# Patient Record
Sex: Male | Born: 1965
Health system: Southern US, Community
[De-identification: ages and names within clinical notes are randomized; demographics above are authoritative.]

## PROBLEM LIST (undated history)

## (undated) DIAGNOSIS — E785 Hyperlipidemia, unspecified: Secondary | ICD-10-CM

## (undated) HISTORY — PX: HERNIA REPAIR: SHX51

## (undated) HISTORY — PX: CARDIAC CATHETERIZATION: SHX172

## (undated) HISTORY — DX: Hyperlipidemia, unspecified: E78.5

---
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
## 2000-11-22 ENCOUNTER — Ambulatory Visit (HOSPITAL_COMMUNITY): Admission: RE | Admit: 2000-11-22 | Discharge: 2000-11-22 | Payer: Self-pay | Admitting: General Surgery

## 2015-12-29 ENCOUNTER — Encounter: Payer: Self-pay | Admitting: Family Medicine

## 2015-12-29 ENCOUNTER — Ambulatory Visit (INDEPENDENT_AMBULATORY_CARE_PROVIDER_SITE_OTHER): Payer: 59 | Admitting: Family Medicine

## 2015-12-29 VITALS — BP 131/83 | HR 74 | Ht 71.75 in | Wt 195.6 lb

## 2015-12-29 DIAGNOSIS — E663 Overweight: Secondary | ICD-10-CM | POA: Diagnosis not present

## 2015-12-29 DIAGNOSIS — I498 Other specified cardiac arrhythmias: Secondary | ICD-10-CM

## 2015-12-29 DIAGNOSIS — E785 Hyperlipidemia, unspecified: Secondary | ICD-10-CM | POA: Insufficient documentation

## 2015-12-29 DIAGNOSIS — E782 Mixed hyperlipidemia: Secondary | ICD-10-CM | POA: Diagnosis not present

## 2015-12-29 NOTE — Progress Notes (Signed)
New patient office visit note:  Impression and Recommendations:    1. Mixed hyperlipidemia   2. Overweight (BMI 25.0-29.9)   3. Periodic heart flutter      Fasting blood work in the near future to see what cholesterol is. Education done and handouts provided Explained to patient what BMI refers to, and what it means medically.    Told patient to think about it as a "medical risk stratification measurement" and how increasing BMI is associated with increasing risk/ or worsening state of various diseases such as hypertension, hyperlipidemia, diabetes, premature OA, depression etc. American Heart Association guidelines for healthy diet, basically Mediterranean diet, and exercise guidelines of 30 minutes 5 days per week or more discussed in detail.  Health counseling performed.  All questions answered.  Patient's heart fluttering sounds benign. He feels it mostly when he is laying in bed, and he did not have them before his mother had her heart surgery and problems with her valves.  However, I told patient if he gets them during exercise or any associated symptoms, and or if he becomes more worried about the symptoms, we can send him to a cardiologist for further evaluation.   Orders Placed This Encounter  Procedures  . CBC with Differential/Platelet  . COMPLETE METABOLIC PANEL WITH GFR  . Hemoglobin A1c  . Lipid panel  . T4, free  . TSH  . VITAMIN D 25 Hydroxy (Vit-D Deficiency, Fractures)    New Prescriptions   No medications on file    Modified Medications   No medications on file    Discontinued Medications   No medications on file    Return in about 6 months (around 06/28/2016) for FBW near future and then OV with me to discuss if things are ABN.  The patient was counseled, risk factors were discussed, anticipatory guidance given.  Gross side effects, risk and benefits, and alternatives of medications discussed with patient.  Patient is aware that all  medications have potential side effects and we are unable to predict every side effect or drug-drug interaction that may occur.  Expresses verbal understanding and consents to current therapy plan and treatment regimen.  Please see AVS handed out to patient at the end of our visit for further patient instructions/ counseling done pertaining to today's office visit.    Note: This document was prepared using Dragon voice recognition software and may include unintentional dictation errors.  ----------------------------------------------------------------------------------------------------------------------    Subjective:    Chief Complaint  Patient presents with  . Establish Care    HPI: Edward Pierce is a pleasant 50 y.o. male who presents to [facility] today to review their medical history with me and establish care.   I asked the patient to review their chronic problem list with me to ensure everything was updated and accurate.    Hasn't been to doc in many yrs- 5-6 yrs.  [facility] in past.   Has h/o HLD- was told 5-6 yrs ago chol was high he has altered diet, less beef, more chicken, fish etc.  Is active but no routine exercise.     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 - married to [place], two kids- D-19 ( [place])  and s-15. Enjoys going to [employer] to their camper and bloating etc.   C/o sporadic heart flutter.  Occurs maybe once monthly--> maybe if that.  Usually at  night when he is lying in bed is when he feels it. Never has it when he is exerting himself   Feels like an "exhaleration".   Mom just had valves replaced - heart sx was Aug 23rd and has A-fib ( Dr Neita Garnet Larence Penning).  Pulse feels fine when he feels fluttering- feels regular and not fast.  No dizzines, fainting,  Cp, No Sob, No DOE.  Walks 1/4 mile to get into his work dilay--> no prob with that.  Patient Care Team    Relationship  Specialty Notifications Start End  Mellody Dance, DO PCP - General Family Medicine  12/29/15   Leandrew Koyanagi, MD Attending Physician Orthopedic Surgery  12/29/15      Wt Readings from Last 3 Encounters:  12/29/15 195 lb 9.6 oz (88.7 kg)   BP Readings from Last 3 Encounters:  12/29/15 131/83   Pulse Readings from Last 3 Encounters:  12/29/15 74   BMI Readings from Last 3 Encounters:  12/29/15 26.71 kg/m   No results found for: HGBA1C  Patient Active Problem List   Diagnosis Date Noted  . h/o Hyperlipidemia 12/29/2015  . Overweight (BMI 25.0-29.9) 12/29/2015  . Periodic heart flutter 12/29/2015     Past Medical History:  Diagnosis Date  . Hyperlipidemia      Past Surgical History:  Procedure Laterality Date  . HERNIA REPAIR       Family History  Problem Relation Age of Onset  . Cancer Mother     breast  . Diabetes Mother   . Hypertension Mother   . Heart disease Mother   . Hyperlipidemia Father   . COPD Father   . Heart attack Paternal Grandfather      History  Drug Use No    History  Alcohol Use  . 1.8 oz/week  . 1 Glasses of wine, 2 Cans of beer per week    History  Smoking Status  . Former Smoker  . Packs/day: 0.25  . Years: 13.00  . Types: Cigarettes  . Quit date: 02/12/1996  Smokeless Tobacco  . Never Used    Patient's Medications   No medications on file    Allergies: Review of patient's allergies indicates no known allergies.  Review of Systems  Constitutional: Negative.  Negative for chills, diaphoresis, fever, malaise/fatigue and weight loss.  HENT: Positive for congestion. Negative for sore throat and tinnitus.   Eyes: Negative.  Negative for blurred vision, double vision and photophobia.  Respiratory: Negative.  Negative for cough and wheezing.        Snoring  Cardiovascular: Positive for palpitations. Negative for chest pain.  Gastrointestinal: Negative.  Negative for blood in stool, diarrhea, nausea and vomiting.    Genitourinary: Negative.  Negative for dysuria, frequency and urgency.  Musculoskeletal: Negative.  Negative for joint pain and myalgias.  Skin: Negative.  Negative for itching and rash.  Neurological: Negative.  Negative for dizziness, focal weakness, weakness and headaches.  Endo/Heme/Allergies: Negative.  Negative for environmental allergies and polydipsia. Does not bruise/bleed easily.  Psychiatric/Behavioral: Negative.  Negative for depression and memory loss. The patient is not nervous/anxious and does not have insomnia.      Objective:   Blood pressure 131/83, pulse 74, height 5' 11.75" (1.822 m), weight 195 lb 9.6 oz (88.7 kg). Body mass index is 26.71 kg/m. General: Well Developed, well nourished, and in no acute distress.  Neuro: Alert and oriented x3, extra-ocular muscles intact, sensation grossly intact.  HEENT: Normocephalic, atraumatic, pupils equal round reactive to  light, neck supple, No JVD, no bruits  Skin: no gross suspicious lesions or rashes  Cardiac: Regular rate and rhythm, no murmurs rubs or gallops.  Respiratory: Essentially clear to auscultation bilaterally. Not using accessory muscles, speaking in full sentences.  Abdominal: Soft, not grossly distended Musculoskeletal: Ambulates w/o diff, FROM * 4 ext.  Vasc: less 2 sec cap RF, warm and pink  Psych:  No HI/SI, judgement and insight good, Euthymic mood. Full Affect.

## 2015-12-29 NOTE — Patient Instructions (Addendum)
Please make as many follow-up office visits as needed to discuss any other concerns you may have otherwise we will get fasting blood work in the near future and see you back as needed if there are abnormalities.   And, in 6 months ( or sooner if you so desire) we will need to do a physical examination and discuss all appropriate screening tests that will need to be done for your age/ sex.         For those diagnosed with high triglycerides, it's important to take action to lower your levels and improve your heart health.  Triglyceride is just a fancy word for fat - the fat in our bodies is stored in the form of triglycerides. Triglycerides are found in foods and manufactured in our bodies.  Normal triglyceride levels are defined as less than 150 mg/dL; 150 to 199 is considered borderline high; 200 to 499 is high; and 500 or higher is officially called very high. To me, anything over 150 is a red flag indicating my patient needs to take immediate steps to get the situation under control.   What is the significance of high triglycerides? High triglyceride levels make blood thicker and stickier, which means that it is more likely to form clots. Studies have shown that triglyceride levels are associated with increased risks of cardiovascular disease and stroke - in both men and women - alone or in combination with other risk factors (high triglycerides combined with high LDL cholesterol can be a particularly deadly combination). For example, in one ground-breaking study, high triglycerides alone increased the risk of cardiovascular disease by 14 percent in men, and by 22 percent in women. But when the test subjects also had low HDL cholesterol (that's the good cholesterol) and other risk factors, high triglycerides increased the risk of disease by 32 percent in men and 76 percent in women.   Fortunately, triglycerides can sometimes be controlled with several diet and lifestyle changes.    What Factors  Can Increase Triglycerides? As with cholesterol, eating too much of the wrong kinds of fats will raise your blood triglycerides.  Therefore, it's important to restrict the amounts of saturated fats and trans fats you allow into your diet.  Triglyceride levels can also shoot up after eating foods that are high in carbohydrates or after drinking alcohol.  That's why triglyceride blood tests require an overnight fast.  If you have elevated triglycerides, it's especially important to avoid sugary and refined carbohydrates, including sugar, honey, and other sweeteners, soda and other sugary drinks, candy, baked goods, and anything made with white (refined or enriched) flour, including white bread, rolls, cereals, buns, pastries, regular pasta, and white rice.  You'll also want to limit dried fruit and fruit juice since they're dense in simple sugar.  All of these low-quality carbs cause a sudden rise in insulin, which may lead to a spike in triglycerides.  Triglycerides can also become elevated as a reaction to having diabetes, hypothyroidism, or kidney disease. As with most other heart-related factors, being overweight and inactive also contribute to abnormal triglycerides. And unfortunately, some people have a genetic predisposition that causes them to manufacture way too much triglycerides on their own, no matter how carefully they eat.   How Can You Lower Your Triglyceride Levels? If you are diagnosed with high triglycerides, it's important to take action. There are several things you can do to help lower your triglyceride levels and improve your heart health:  Lose weight if you are overweight.  There is a clear correlation between obesity and high triglycerides - the heavier people are, the higher their triglyceride levels are likely to be. The good news is that losing weight can significantly lower triglycerides. In a large study of individuals with type 2 diabetes, those assigned to the "lifestyle  intervention group" - which involved counseling, a low-calorie meal plan, and customized exercise program - lost 8.6% of their body weight and lowered their triglyceride levels by more than 16%. If you're overweight, find a weight loss plan that works for you and commit to shedding the pounds and getting healthier.  Reduce the amount of saturated fat and trans fat in your diet.  Start by avoiding or dramatically limiting butter, cream cheese, lard, sour cream, doughnuts, cakes, cookies, candy bars, regular ice cream, fried foods, pizza, cheese sauce, cream-based sauces and salad dressings, high-fat meats (including fatty hamburgers, bologna, pepperoni, sausage, bacon, salami, pastrami, spareribs, and hot dogs), high-fat cuts of beef and pork, and whole-milk dairy products.   Other ways to cut back: Choose lean meats only (including skinless chicken and [place], lean beef, lean pork), fish, and reduced-fat or fat-free dairy products.   Experiment with adding whole soy foods to your diet. Although soy itself may not reduce risk of heart disease, it replaces hazardous animal fats with healthier proteins. Choose high-quality soy foods, such as tofu, tempeh, soy milk, and edamame (whole soybeans).  Always remove skin from poultry.  Prepare foods by baking, roasting, broiling, boiling, poaching, steaming, grilling, or stir-frying in vegetable oil.  Most stick margarines contain trans fats, and trans fats are also found in some packaged baked goods, potato chips, snack foods, fried foods, and fast food that use or create hydrogenated oils.    (All food labels must now list the amount of trans fats, right after the amount of saturated fats - good news for consumers. As a result, many food companies have now reformulated their products to be trans fat free.many, but not all! So it's still just as important to read labels and make sure the packaged foods you buy don't contain trans fats.)     If you use  margarine, purchase soft-tub margarine spreads that contain 0 grams trans fats and don't list any partially hydrogenated oils in the ingredients list. By substituting olive oil or vegetable oil for trans fats in just 2 percent of your daily calories, you can reduce your risk of heart disease by 53 percent.   There is no safe amount of trans fats, so try to keep them as far from your plate as possible.  Avoid foods that are concentrated in sugar (even dried fruit and fruit juice). Sugary foods can elevate triglyceride levels in the blood, so keep them to a bare minimum.  Swap out refined carbohydrates for whole grains.  Refined carbohydrates - like white rice, regular pasta, and anything made with white or "enriched" flour (including white bread, rolls, cereals, buns, and crackers) - raise blood sugar and insulin levels more than fiber-rich whole grains. Higher insulin levels, in turn, can lead to a higher rise in triglycerides after a meal. So, make the switch to whole wheat bread, whole grain pasta, brown or wild rice, and whole grain versions of cereals, crackers, and other bread products. However, it's important to know that individuals with high triglycerides should moderate even their intake of high-quality starches (since all starches raise blood sugar) - I recommend 1 to 2 servings per meal.  Cut way back on alcohol.  If you have high triglycerides, alcohol should be considered a rare treat - if you indulge at all, since even small amounts of alcohol can dramatically increase triglyceride levels.  Incorporate omega-3 fats.  Heart-healthy fish oils are especially rich in omega-3 fatty acids. In multiple studies over the past two decades, people who ate diets high in omega-3s had 30 to 40 percent reductions in heart disease. Although we don't yet know why fish oil works so well, there are several possibilities. Omega-3s seem to reduce inflammation, reduce high blood pressure, decrease  triglycerides, raise HDL cholesterol, and make blood thinner and less sticky so it is less likely to clot. It's as close to a food prescription for heart health as it gets. If you have high triglycerides, I recommend eating at least three servings of one of the omega-3-rich fish every week (fatty fish is the most concentrated food form of omega three fats). If you cannot manage to eat that much fish, speak with your physician about taking fish oil capsules, which offer similar benefits.The best foods for omega-3 fatty acids include wild salmon (fresh, canned), herring, mackerel (not king), sardines, anchovies, rainbow trout, and Pacific oysters. Non-fish sources of omega-3 fats include omega-3-fortified eggs, ground flaxseed, chia seeds, walnuts, butternuts (white walnuts), seaweed, walnut oil, canola oil, and soybeans.  Quit smoking.  Smoking causes inflammation, not just in your lungs, but throughout your body. Inflammation can contribute to atherosclerosis, blood clots, and risk of heart attack. Smoking makes all heart health indicators worse. If you have high cholesterol, high triglycerides, or high blood pressure, smoking magnifies the danger.  Become more physically active.  Even moderate exercise can help improve cholesterol, triglycerides, and blood pressure. Aerobic exercise seems to be able to stop the sharp rise of triglycerides after eating, perhaps because of a decrease in the amount of triglyceride released by the liver, or because active muscle clears triglycerides out of the blood stream more quickly than inactive muscle. If you haven't exercised regularly (or at all) for years, I recommend starting slowly, by walking at an easy pace for 15 minutes a day. Then, as you feel more comfortable, increase the amount. Your ultimate goal should be at least 30 minutes of moderate physical activity, at least five days a week.    Guidelines for a Low Cholesterol, Low Saturated Fat Diet   Fats -  Limit total intake of fats and oils. - Avoid butter, stick margarine, shortening, lard, palm and coconut oils. - Limit mayonnaise, salad dressings, gravies and sauces, unless they are homemade with low-fat ingredients. - Limit chocolate. - Choose low-fat and nonfat products, such as low-fat mayonnaise, low-fat or non-hydrogenated peanut butter, low-fat or fat-free salad dressings and nonfat gravy. - Use vegetable oil, such as canola or olive oil. - Look for margarine that does not contain trans fatty acids. - Use nuts in moderate amounts. - Read ingredient labels carefully to determine both amount and type of fat present in foods. Limit saturated and trans fats! - Avoid high-fat processed and convenience foods.  Meats and Meat Alternatives - Choose fish, chicken, [place] and lean meats. - Use dried beans, peas, lentils and tofu. - Limit egg yolks to three to four per week. - If you eat red meat, limit to no more than three servings per week and choose loin or round cuts. - Avoid fatty meats, such as bacon, sausage, franks, luncheon meats and ribs. - Avoid all organ meats, including liver.  Dairy - Choose nonfat or low-fat milk,  yogurt and cottage cheese. - Most cheeses are high in fat. Choose cheeses made from non-fat milk, such as mozzarella and ricotta cheese. - Choose light or fat-free cream cheese and sour cream. - Avoid cream and sauces made with cream.  Fruits and Vegetables - Eat a wide variety of fruits and vegetables. - Use lemon juice, vinegar or "mist" olive oil on vegetables. - Avoid adding sauces, fat or oil to vegetables.  Breads, Cereals and Grains - Choose whole-grain breads, cereals, pastas and rice. - Avoid high-fat snack foods, such as granola, cookies, pies, pastries, doughnuts and croissants.  Cooking Tips - Avoid deep fried foods. - Trim visible fat off meats and remove skin from poultry before cooking. - Bake, broil, boil, poach or roast poultry, fish and  lean meats. - Drain and discard fat that drains out of meat as you cook it. - Add little or no fat to foods. - Use vegetable oil sprays to grease pans for cooking or baking. - Steam vegetables. - Use herbs or no-oil marinades to flavor foods.

## 2015-12-30 ENCOUNTER — Other Ambulatory Visit (INDEPENDENT_AMBULATORY_CARE_PROVIDER_SITE_OTHER): Payer: 59

## 2015-12-30 DIAGNOSIS — E663 Overweight: Secondary | ICD-10-CM

## 2015-12-30 DIAGNOSIS — E782 Mixed hyperlipidemia: Secondary | ICD-10-CM | POA: Diagnosis not present

## 2015-12-31 LAB — COMPLETE METABOLIC PANEL WITH GFR
ALT: 17 U/L (ref 9–46)
AST: 23 U/L (ref 10–35)
Albumin: 4.4 g/dL (ref 3.6–5.1)
Alkaline Phosphatase: 75 U/L (ref 40–115)
BUN: 13 mg/dL (ref 7–25)
CHLORIDE: 105 mmol/L (ref 98–110)
CO2: 24 mmol/L (ref 20–31)
Calcium: 9.1 mg/dL (ref 8.6–10.3)
Creat: 0.89 mg/dL (ref 0.70–1.33)
Glucose, Bld: 99 mg/dL (ref 65–99)
POTASSIUM: 3.8 mmol/L (ref 3.5–5.3)
Sodium: 139 mmol/L (ref 135–146)
Total Bilirubin: 0.5 mg/dL (ref 0.2–1.2)
Total Protein: 6.9 g/dL (ref 6.1–8.1)

## 2015-12-31 LAB — CBC WITH DIFFERENTIAL/PLATELET
BASOS ABS: 0 {cells}/uL (ref 0–200)
BASOS PCT: 0 %
EOS ABS: 58 {cells}/uL (ref 15–500)
Eosinophils Relative: 1 %
HCT: 47 % (ref 38.5–50.0)
Hemoglobin: 15.6 g/dL (ref 13.2–17.1)
LYMPHS PCT: 21 %
Lymphs Abs: 1218 cells/uL (ref 850–3900)
MCH: 29.5 pg (ref 27.0–33.0)
MCHC: 33.2 g/dL (ref 32.0–36.0)
MCV: 88.8 fL (ref 80.0–100.0)
MONOS PCT: 8 %
MPV: 9.6 fL (ref 7.5–12.5)
Monocytes Absolute: 464 cells/uL (ref 200–950)
Neutro Abs: 4060 cells/uL (ref 1500–7800)
Neutrophils Relative %: 70 %
PLATELETS: 203 10*3/uL (ref 140–400)
RBC: 5.29 MIL/uL (ref 4.20–5.80)
RDW: 13.4 % (ref 11.0–15.0)
WBC: 5.8 10*3/uL (ref 3.8–10.8)

## 2015-12-31 LAB — T4, FREE: Free T4: 1.3 ng/dL (ref 0.8–1.8)

## 2015-12-31 LAB — LIPID PANEL
CHOL/HDL RATIO: 9.2 ratio — AB (ref <=5.0)
CHOLESTEROL: 293 mg/dL — AB (ref 125–200)
HDL: 32 mg/dL — AB (ref >=40)
Triglycerides: 406 mg/dL — ABNORMAL HIGH (ref <150)

## 2015-12-31 LAB — VITAMIN D 25 HYDROXY (VIT D DEFICIENCY, FRACTURES): Vit D, 25-Hydroxy: 25 ng/mL — ABNORMAL LOW (ref 30–100)

## 2015-12-31 LAB — HEMOGLOBIN A1C
Hgb A1c MFr Bld: 5.3 % (ref <5.7)
MEAN PLASMA GLUCOSE: 105 mg/dL

## 2015-12-31 LAB — TSH: TSH: 1.63 mIU/L (ref 0.40–4.50)

## 2016-01-06 ENCOUNTER — Other Ambulatory Visit: Payer: Self-pay | Admitting: Family Medicine

## 2016-01-06 DIAGNOSIS — E781 Pure hyperglyceridemia: Secondary | ICD-10-CM

## 2016-01-07 NOTE — Progress Notes (Signed)
Completed.  See note in lab results.  Charyl Bigger, CMA

## 2016-01-13 ENCOUNTER — Other Ambulatory Visit (INDEPENDENT_AMBULATORY_CARE_PROVIDER_SITE_OTHER): Payer: 59

## 2016-01-13 DIAGNOSIS — E781 Pure hyperglyceridemia: Secondary | ICD-10-CM

## 2016-01-14 LAB — LIPID PANEL W/REFLEX DIRECT LDL
Cholesterol: 290 mg/dL — ABNORMAL HIGH (ref 125–200)
HDL: 32 mg/dL — ABNORMAL LOW (ref >=40)
LDL Cholesterol: 183 mg/dL — ABNORMAL HIGH (ref <130)
TRIGLYCERIDES: 374 mg/dL — AB (ref <150)
Total CHOL/HDL Ratio: 9.1 Ratio — ABNORMAL HIGH (ref <=5.0)

## 2016-01-19 ENCOUNTER — Encounter: Payer: Self-pay | Admitting: Family Medicine

## 2016-01-19 ENCOUNTER — Ambulatory Visit (INDEPENDENT_AMBULATORY_CARE_PROVIDER_SITE_OTHER): Payer: 59 | Admitting: Family Medicine

## 2016-01-19 VITALS — BP 123/79 | HR 63 | Resp 18 | Wt 199.0 lb

## 2016-01-19 DIAGNOSIS — E786 Lipoprotein deficiency: Secondary | ICD-10-CM | POA: Diagnosis not present

## 2016-01-19 DIAGNOSIS — E559 Vitamin D deficiency, unspecified: Secondary | ICD-10-CM | POA: Diagnosis not present

## 2016-01-19 DIAGNOSIS — E781 Pure hyperglyceridemia: Secondary | ICD-10-CM | POA: Diagnosis not present

## 2016-01-19 DIAGNOSIS — E782 Mixed hyperlipidemia: Secondary | ICD-10-CM

## 2016-01-19 DIAGNOSIS — E663 Overweight: Secondary | ICD-10-CM

## 2016-01-19 DIAGNOSIS — E785 Hyperlipidemia, unspecified: Secondary | ICD-10-CM

## 2016-01-19 DIAGNOSIS — F1010 Alcohol abuse, uncomplicated: Secondary | ICD-10-CM

## 2016-01-19 HISTORY — DX: Hyperlipidemia, unspecified: E78.5

## 2016-01-19 HISTORY — DX: Pure hyperglyceridemia: E78.1

## 2016-01-19 MED ORDER — ATORVASTATIN CALCIUM 40 MG PO TABS: 40.0000 mg | ORAL_TABLET | Freq: Every day | ORAL | 3 refills | Status: DC

## 2016-01-19 MED ORDER — OMEGA-3-ACID ETHYL ESTERS 1 G PO CAPS: 2.0000 g | ORAL_CAPSULE | Freq: Two times a day (BID) | ORAL | 3 refills | Status: DC

## 2016-01-19 NOTE — Patient Instructions (Addendum)
4 your vitamin D-take 5000 IUs daily. We will recheck this in 6 months.  check liver enzymes in 6-8 wks after starting Lipitor.  Please try to really cut back on alcohol intake and no more than 1 or 2 per day. Likewise try to drink more water at least 100 ounces per day if you're not sweating or exercising or anything. If you are you'll need more.   Use lose it or my fitness pal---> track all your foods.    Fats and Cholesterol  When it comes to dietary fat, what matters most is the type of fat you eat. Contrary to past dietary advice promoting low-fat diets, newer research shows that healthy fats are necessary and beneficial for health.  When food manufacturers reduce fat, they often replace it with carbohydrates from sugar, refined grains, or other starches. Our bodies digest these refined carbohydrates and starches very quickly, affecting blood sugar and insulin levels and possibly resulting in weight gain and disease. (1-3) Findings from the Nurses' Health Study (4) and the Health Professionals Follow-up Study (5) show that no link between the overall percentage of calories from fat and any important health outcome, including cancer, heart disease, and weight gain. Rather than adopting a low-fat diet, it's more important to focus on eating beneficial "good" fats and avoiding harmful "bad" fats. Fat is an important part of a healthy diet. Choose foods with "good" unsaturated fats, limit foods high in saturated fat, and avoid "bad" trans fat.  "Good" unsaturated fats - Monounsaturated and polyunsaturated fats - lower disease risk. Foods high in good fats include vegetable oils (such as olive, canola, sunflower, soy, and corn), nuts, seeds, and fish. "Bad" fats - trans fats - increase disease risk, even when eaten in small quantities. Foods containing trans fats are primarily in processed foods made with trans fat from partially hydrogenated oil. Fortunately, trans fats have been eliminated from  many of these foods. Saturated fats, while not as harmful as trans fats, by comparison with unsaturated fats negatively impact health and are best consumed in moderation. Foods containing large amounts of saturated fat include red meat, butter, cheese, and ice cream. When you cut back on foods like red meat and butter, replace them with fish, beans, nuts, and healthy oils instead of refined carbohydrates. Read more about healthy fats in this "Ask the Expert" with [facility]'s Dr. Vinnie Langton and Amy Roney Mans, M.S., R.D., formerly of [employer]     How Much Omega-3 Should You Take Per Day?  Omega-3 fatty acids have many health benefits.  The best way to get them is by eating fatty fish at least twice a week.  If you don't eat fatty fish very often, you should consider taking a supplement.  However, it's important to make sure your supplement contains enough EPA and DHA.  EPA and DHA are the most useful types of omega-3s, and are found in fatty fish and algae.  This article reviews how much omega-3 (combined EPA and DHA) you need for optimal health.  Official Omega-3 Dosage Guidelines There is no set standard for how much omega-3 you should get each day.  Various mainstream health organizations have released their own expert opinions, but they vary considerably.  Overall, most of these organizations recommend a minimum of 250-500 mg combined EPA and DHA each day for healthy adults (1, 2, 3, 4).  However, higher amounts are often recommended for certain health conditions.  BOTTOM LINE: To date, there is no  official recommended daily allowance of omega-3s. However, most health organizations agree that 250-500 mg of combined EPA and DHA is enough for adults to maintain overall health. Omega-3 for Specific Health Conditions The following health conditions have been shown to respond to omega-3 supplements.  Here's a quick rundown of the dosages used:  Heart  Health  One trial followed 11,000 participants who took an 850-mg dose of combined EPA and DHA every day for 3.5 years. This caused a 25% reduction in heart attacks and a 45% reduction in sudden death (5).  Numerous organizations, including the AHA, recommend that patients with coronary heart disease take 1,000 mg. They recommend that patients with high triglycerides take 2,000-4,000 mg of combined EPA and DHA each day (6, 7, 8, 9).  However, several large review studies have not found any beneficial effects of omega-3 fatty acids on heart disease (10, 11).  Depression and Anxiety  Studies have shown that high doses of omega-3, from 200-2,200 mg per day, can reduce symptoms of depression and anxiety (12, 13, 14, 15).  In the case of mood and mental disorders, a supplement with higher amounts of EPA than DHA may be optimal.  Cancer  High consumption of fish and omega-3 fatty acids has been linked to a reduced risk of breast, prostate and colon cancers (16, 17, 18, 19).  However, an optimal dosage to reduce the risk of cancer has not been established.  Others  Omega-3 fatty acids can help with numerous other health problems. The effective dosages depend on numerous factors.  BOTTOM LINE: Omega-3 fatty acids can help with numerous health conditions. The dosages shown to be effective range from 200-4,000 mg. ADVERTISEMENT Omega-3 for Children and Pregnant Women Research shows that omega-3 fatty acids, especially DHA, are vital before, during and after pregnancy (20, 21, 22, 23).  Nearly all official guidelines recommend following adult guidelines and then adding an additional 200 mg of DHA during pregnancy and breastfeeding (24, 25, 26, 27).  Several global and national organizations have published guidelines for infants and children, ranging from 50-100 mg per day of combined EPA and DHA (8, 26).  BOTTOM LINE: Taking an additional 200 mg of DHA is recommended for pregnant and nursing  mothers. The recommended dose for infants and children is 50-100 mg of combined EPA and DHA per day. Omega-6 Intake May Affect Omega-3 Need The typical Western diet contains around 10 times more omega-6s than omega-3s. These omega-6 fatty acids come mainly from refined vegetable oils that are added to processed food (28, 29).  Many experts believe that this ratio should be closer to 2:1 (omega-6:omega-3) for optimal health (30).  Omega-6 and omega-3 fatty acids compete for the same enzymes. These enzymes are needed for the fatty acids to be converted into their active forms (31, 32).  Therefore, your omega-3 needs may depend on your omega-6 intake. If you consume a lot of omega-6s, you may need even higher amounts of omega-3s.  BOTTOM LINE: The human body may function best with balanced amounts of omega-6 and omega-3. The more omega-6 fatty acids you consume, the more omega-3s you may need. Too Much Omega-3 Can be Harmful The FDA has claimed that the use of omega-3s from supplements is safe if doses do not exceed 3,000 mg per day.  On the other hand, the EFSA ([place] equivalent of the FDA) has declared that up to 5,000 mg per day from supplements is safe.  These cautions are in place for several reasons. For one,  omega-3s can cause blood thinning or excessive bleeding in some people.  For this reason, many organizations encourage people who are planning surgery to stop taking omega-3 supplements a week or two before the procedure.  The second reason is due to vitamin A. This vitamin can be toxic in high amounts, and some omega-3 supplements (such as cod liver oil) are high in it.  Finally, taking more than 5,000 mg of omega-3s has never been shown to provide any added benefits. So don't take the risk.  BOTTOM LINE: Taking up to 3,000-5,000 mg of omega-3 per day appears to be safe, although such a high intake is likely not necessary for most people. ADVERTISEMENT Omega-3 Supplement  Doses It's important to read the label of your omega-3 supplement to figure out how much EPA and DHA it actually contains.  This amount varies, and the labels can be confusing. For example, a supplement may say it contains 1,000 mg of fish oil, but in reality actually contains much less than that.  Depending on the concentration of EPA and DHA that is in a dose, you may need to take as many as 8 capsules to reach the recommended amount.  I personally aim for 500 mg of combined EPA and DHA from supplements alone, in as few doses (tablespoons of fish oil or capsules) as possible.  You can find a detailed guide to omega-3 supplements, including which ones to buy, in this article: Omega-3 Supplement Guide: What to Buy and Why.  BOTTOM LINE: It's important consider how much EPA and DHA is in a supplement, not just how much fish oil it contains. This helps ensure you're getting enough EPA and DHA. Take Home Message Always follow the instructions on the omega-3 supplement label.  However, keep in mind that different people have different omega-3 needs. Some people may need to take more than others.  Aim for a minimum of 250 mg and a maximum of 3000 mg of combined EPA and DHA per day, unless instructed otherwise by a health professional.  An evidence-based article from our experts at [employer].        For those diagnosed with high triglycerides, it's important to take action to lower your levels and improve your heart health.  Triglyceride is just a fancy word for fat - the fat in our bodies is stored in the form of triglycerides. Triglycerides are found in foods and manufactured in our bodies.  Normal triglyceride levels are defined as less than 150 mg/dL; 150 to 199 is considered borderline high; 200 to 499 is high; and 500 or higher is officially called very high. To me, anything over 150 is a red flag indicating my patient needs to take immediate steps to get the situation under  control.   What is the significance of high triglycerides? High triglyceride levels make blood thicker and stickier, which means that it is more likely to form clots. Studies have shown that triglyceride levels are associated with increased risks of cardiovascular disease and stroke - in both men and women - alone or in combination with other risk factors (high triglycerides combined with high LDL cholesterol can be a particularly deadly combination). For example, in one ground-breaking study, high triglycerides alone increased the risk of cardiovascular disease by 14 percent in men, and by 74 percent in women. But when the test subjects also had low HDL cholesterol (that's the good cholesterol) and other risk factors, high triglycerides increased the risk of disease by 32 percent in  men and 30 percent in women.   Fortunately, triglycerides can sometimes be controlled with several diet and lifestyle changes.    What Factors Can Increase Triglycerides? As with cholesterol, eating too much of the wrong kinds of fats will raise your blood triglycerides.  Therefore, it's important to restrict the amounts of saturated fats and trans fats you allow into your diet.  Triglyceride levels can also shoot up after eating foods that are high in carbohydrates or after drinking alcohol.  That's why triglyceride blood tests require an overnight fast.  If you have elevated triglycerides, it's especially important to avoid sugary and refined carbohydrates, including sugar, honey, and other sweeteners, soda and other sugary drinks, candy, baked goods, and anything made with white (refined or enriched) flour, including white bread, rolls, cereals, buns, pastries, regular pasta, and white rice.  You'll also want to limit dried fruit and fruit juice since they're dense in simple sugar.  All of these low-quality carbs cause a sudden rise in insulin, which may lead to a spike in triglycerides.  Triglycerides can also become  elevated as a reaction to having diabetes, hypothyroidism, or kidney disease. As with most other heart-related factors, being overweight and inactive also contribute to abnormal triglycerides. And unfortunately, some people have a genetic predisposition that causes them to manufacture way too much triglycerides on their own, no matter how carefully they eat.   How Can You Lower Your Triglyceride Levels? If you are diagnosed with high triglycerides, it's important to take action. There are several things you can do to help lower your triglyceride levels and improve your heart health:  Lose weight if you are overweight.  There is a clear correlation between obesity and high triglycerides - the heavier people are, the higher their triglyceride levels are likely to be. The good news is that losing weight can significantly lower triglycerides. In a large study of individuals with type 2 diabetes, those assigned to the "lifestyle intervention group" - which involved counseling, a low-calorie meal plan, and customized exercise program - lost 8.6% of their body weight and lowered their triglyceride levels by more than 16%. If you're overweight, find a weight loss plan that works for you and commit to shedding the pounds and getting healthier.  Reduce the amount of saturated fat and trans fat in your diet.  Start by avoiding or dramatically limiting butter, cream cheese, lard, sour cream, doughnuts, cakes, cookies, candy bars, regular ice cream, fried foods, pizza, cheese sauce, cream-based sauces and salad dressings, high-fat meats (including fatty hamburgers, bologna, pepperoni, sausage, bacon, salami, pastrami, spareribs, and hot dogs), high-fat cuts of beef and pork, and whole-milk dairy products.   Other ways to cut back: Choose lean meats only (including skinless chicken and [place], lean beef, lean pork), fish, and reduced-fat or fat-free dairy products.   Experiment with adding whole soy foods to your  diet. Although soy itself may not reduce risk of heart disease, it replaces hazardous animal fats with healthier proteins. Choose high-quality soy foods, such as tofu, tempeh, soy milk, and edamame (whole soybeans).  Always remove skin from poultry.  Prepare foods by baking, roasting, broiling, boiling, poaching, steaming, grilling, or stir-frying in vegetable oil.  Most stick margarines contain trans fats, and trans fats are also found in some packaged baked goods, potato chips, snack foods, fried foods, and fast food that use or create hydrogenated oils.    (All food labels must now list the amount of trans fats, right after the amount of  saturated fats - good news for consumers. As a result, many food companies have now reformulated their products to be trans fat free.many, but not all! So it's still just as important to read labels and make sure the packaged foods you buy don't contain trans fats.)     If you use margarine, purchase soft-tub margarine spreads that contain 0 grams trans fats and don't list any partially hydrogenated oils in the ingredients list. By substituting olive oil or vegetable oil for trans fats in just 2 percent of your daily calories, you can reduce your risk of heart disease by 53 percent.   There is no safe amount of trans fats, so try to keep them as far from your plate as possible.  Avoid foods that are concentrated in sugar (even dried fruit and fruit juice). Sugary foods can elevate triglyceride levels in the blood, so keep them to a bare minimum.  Swap out refined carbohydrates for whole grains.  Refined carbohydrates - like white rice, regular pasta, and anything made with white or "enriched" flour (including white bread, rolls, cereals, buns, and crackers) - raise blood sugar and insulin levels more than fiber-rich whole grains. Higher insulin levels, in turn, can lead to a higher rise in triglycerides after a meal. So, make the switch to whole wheat bread, whole  grain pasta, brown or wild rice, and whole grain versions of cereals, crackers, and other bread products. However, it's important to know that individuals with high triglycerides should moderate even their intake of high-quality starches (since all starches raise blood sugar) - I recommend 1 to 2 servings per meal.  Cut way back on alcohol.  If you have high triglycerides, alcohol should be considered a rare treat - if you indulge at all, since even small amounts of alcohol can dramatically increase triglyceride levels.  Incorporate omega-3 fats.  Heart-healthy fish oils are especially rich in omega-3 fatty acids. In multiple studies over the past two decades, people who ate diets high in omega-3s had 30 to 40 percent reductions in heart disease. Although we don't yet know why fish oil works so well, there are several possibilities. Omega-3s seem to reduce inflammation, reduce high blood pressure, decrease triglycerides, raise HDL cholesterol, and make blood thinner and less sticky so it is less likely to clot. It's as close to a food prescription for heart health as it gets. If you have high triglycerides, I recommend eating at least three servings of one of the omega-3-rich fish every week (fatty fish is the most concentrated food form of omega three fats). If you cannot manage to eat that much fish, speak with your physician about taking fish oil capsules, which offer similar benefits.The best foods for omega-3 fatty acids include wild salmon (fresh, canned), herring, mackerel (not king), sardines, anchovies, rainbow trout, and Pacific oysters. Non-fish sources of omega-3 fats include omega-3-fortified eggs, ground flaxseed, chia seeds, walnuts, butternuts (white walnuts), seaweed, walnut oil, canola oil, and soybeans.  Quit smoking.  Smoking causes inflammation, not just in your lungs, but throughout your body. Inflammation can contribute to atherosclerosis, blood clots, and risk of heart attack.  Smoking makes all heart health indicators worse. If you have high cholesterol, high triglycerides, or high blood pressure, smoking magnifies the danger.  Become more physically active.  Even moderate exercise can help improve cholesterol, triglycerides, and blood pressure. Aerobic exercise seems to be able to stop the sharp rise of triglycerides after eating, perhaps because of a decrease in the amount of  triglyceride released by the liver, or because active muscle clears triglycerides out of the blood stream more quickly than inactive muscle. If you haven't exercised regularly (or at all) for years, I recommend starting slowly, by walking at an easy pace for 15 minutes a day. Then, as you feel more comfortable, increase the amount. Your ultimate goal should be at least 30 minutes of moderate physical activit5 days per week or more.

## 2016-01-19 NOTE — Progress Notes (Signed)
Assessment and plan:  1. Hyperlipemia, mixed   2. Hypertriglyceridemia, familial   3. Low HDL (under 40)   4. Vitamin D deficiency   5. Overweight (BMI 25.0-29.9)   6. Excessive drinking of alcohol at times     Vitamin D deficiency Pt's Vitamin D level is too low.   Pt needs to take an OTC daily dose of 5,000 IUs of vitamin D3.  Inform patient this will likely be a lifelong thing, and we will monitor it yearly.   Overweight (BMI 25.0-29.9) Counseling done- advised wt loss.  Use lose it or my fitness pal---> track all foods/ drinks.   Hyperlipemia, mixed  check liver enzymes in 6-8 wks after starting Lipitor.   Please try to really cut back alcohol intake and no more than 1 or 2 per day.   Likewise try to drink more water at least 100 ounces per day if you're not sweating or exercising or anything. If you are you'll need more.   Hypertriglyceridemia, familial Pt's risk is >er 7.5%.   Start meds.  Dietary changes such as low saturated & trans fat and low carb/ ketogenic diets discussed with patient.  Encouraged regular exercise and weight loss when appropriate.   Educational handouts provided at patient's desire.  Also, risks and benefits of medications discussed with patient, including alternative treatments.   Encouraged patient to read drug information handouts to further educate self about the medicine prior to starting it.   Contact us prior with any Q's/ concerns.  Low HDL (under 40) Move more  Excessive drinking of alcohol at times Connection btwn TG and ETOH d/c pt   Pt was in the office today for 40+ minutes, with over 50% time spent in face to face counseling of various medical concerns and in coordination of care.  He had many questions about what to eat and what not to eat.  Also advised tracking calories/ foods and we discussed options etc. New Prescriptions   ATORVASTATIN (LIPITOR) 40 MG  TABLET    Take 1 tablet (40 mg total) by mouth at bedtime.   OMEGA-3 ACID ETHYL ESTERS (LOVAZA) 1 G CAPSULE    Take 2 capsules (2 g total) by mouth 2 (two) times daily.    Return for 6-8 wks- reck LFT and see how tol new med.  Anticipatory guidance and routine counseling done re: condition, txmnt options and need for follow up. All questions of patient's were answered.   Gross side effects, risk and benefits, and alternatives of medications discussed with patient.  Patient is aware that all medications have potential side effects and we are unable to predict every sideeffect or drug-drug interaction that may occur.  Expresses verbal understanding and consents to current therapy plan and treatment regiment.  Please see AVS handed out to patient at the end of our visit for additional patient instructions/ counseling done pertaining to today's office visit.  Note: This document was prepared using Dragon voice recognition software and may include unintentional dictation errors.   ----------------------------------------------------------------------------------------------------------------------  Subjective:   CC:   Edward Pierce is a 50 y.o. male who presents to [place] at [facility] today for review and discussion of recent bloodwork that was done.  All recent blood work that we ordered was reviewed with patient today.  Patient was counseled on all abnormalities and we discussed dietary and lifestyle changes that could help those values (also medications when appropriate).  Extensive health counseling  performed and all patient's concerns/ questions were addressed.   From last OV:   "Hasn't been to doc in many yrs- 5-6 yrs.  [facility] in past.   Has h/o HLD- was told 5-6 yrs ago chol was high he has altered diet, less beef, more chicken, fish etc.  Is active but no routine exercise.     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 - married to [place], two kids- D-19 ( [place])  and s-15.    Enjoys going to [facility] to their camper and boating etc."    Wt Readings from Last 3 Encounters:  01/19/16 199 lb (90.3 kg)  12/29/15 195 lb 9.6 oz (88.7 kg)   BP Readings from Last 3 Encounters:  01/19/16 123/79  12/29/15 131/83   Pulse Readings from Last 3 Encounters:  01/19/16 63  12/29/15 74   BMI Readings from Last 3 Encounters:  01/19/16 27.18 kg/m  12/29/15 26.71 kg/m     Patient Care Team    Relationship Specialty Notifications Start End  Mellody Dance, DO PCP - General Family Medicine  12/29/15   Leandrew Koyanagi, MD Attending Physician Orthopedic Surgery  12/29/15     Full medical history updated and reviewed in the office today   Patient Active Problem List   Diagnosis Date Noted  . Vitamin D deficiency 01/23/2016  . Excessive drinking of alcohol at times 01/23/2016  . Hypertriglyceridemia, familial 01/19/2016  . Hyperlipemia, mixed 01/19/2016  . Low HDL (under 40) 01/19/2016  . Overweight (BMI 25.0-29.9) 12/29/2015  . Periodic heart flutter 12/29/2015    Past Medical History:  Diagnosis Date  . Hyperlipidemia     Past Surgical History:  Procedure Laterality Date  . HERNIA REPAIR      Social History  Substance Use Topics  . Smoking status: Former Smoker    Packs/day: 0.25    Years: 13.00    Types: Cigarettes    Quit date: 02/12/1996  . Smokeless tobacco: Never Used  . Alcohol use 1.8 oz/week    1 Glasses of wine, 2 Cans of beer per week    Family Hx: Family History  Problem Relation Age of Onset  . Cancer Mother     breast  . Diabetes Mother   . Hypertension Mother   . Heart disease Mother   . Hyperlipidemia Father   . COPD Father   . Heart attack Paternal Grandfather      Medications: Current Outpatient Prescriptions  Medication Sig Dispense Refill  . cholecalciferol (VITAMIN D) 1000 units tablet Take 5,000 Units by mouth daily.    Marland Kitchen  atorvastatin (LIPITOR) 40 MG tablet Take 1 tablet (40 mg total) by mouth at bedtime. 90 tablet 3  . omega-3 acid ethyl esters (LOVAZA) 1 g capsule Take 2 capsules (2 g total) by mouth 2 (two) times daily. 360 capsule 3   No current facility-administered medications for this visit.     Allergies:  No Known Allergies   ROS: Review of Systems  Constitutional: Negative.  Negative for chills, diaphoresis, fever, malaise/fatigue and weight loss.  HENT: Negative.  Negative for congestion, sore throat and tinnitus.   Eyes: Negative.  Negative for blurred vision, double vision and photophobia.  Respiratory: Negative.  Negative for cough and wheezing.   Cardiovascular: Negative.  Negative for chest pain and palpitations.       Denies cardiac sx today  Gastrointestinal: Negative.  Negative for blood in stool,  diarrhea, nausea and vomiting.  Genitourinary: Negative.  Negative for dysuria, frequency and urgency.  Musculoskeletal: Negative.  Negative for joint pain and myalgias.  Skin: Negative.  Negative for itching and rash.  Neurological: Negative.  Negative for dizziness, focal weakness, weakness and headaches.  Endo/Heme/Allergies: Negative.  Negative for environmental allergies and polydipsia. Does not bruise/bleed easily.  Psychiatric/Behavioral: Negative.  Negative for depression and memory loss. The patient is not nervous/anxious and does not have insomnia.     Objective:  Blood pressure 123/79, pulse 63, resp. rate 18, weight 199 lb (90.3 kg), SpO2 97 %. Body mass index is 27.18 kg/m. Gen:   Well NAD, A and O *3 HEENT:    [place]/AT, EOMI,  MMM, OP- clr Lungs:   Normal work of breathing. CTA B/L, no Wh, rhonchi Heart:   RRR, S1, S2 WNL's, no MRG Abd:   No gross distention Exts:    warm, pink,  Brisk capillary refill, warm and well perfused.  Psych:    No HI/SI, judgement and insight good, Euthymic mood. Full Affect.   Recent Results (from the past 2160 hour(s))  CBC with  Differential/Platelet     Status: None   Collection Time: 12/30/15  8:57 AM  Result Value Ref Range   WBC 5.8 3.8 - 10.8 K/uL   RBC 5.29 4.20 - 5.80 MIL/uL   Hemoglobin 15.6 13.2 - 17.1 g/dL   HCT 47.0 38.5 - 50.0 %   MCV 88.8 80.0 - 100.0 fL   MCH 29.5 27.0 - 33.0 pg   MCHC 33.2 32.0 - 36.0 g/dL   RDW 13.4 11.0 - 15.0 %   Platelets 203 140 - 400 K/uL   MPV 9.6 7.5 - 12.5 fL   Neutro Abs 4,060 1,500 - 7,800 cells/uL   Lymphs Abs 1,218 850 - 3,900 cells/uL   Monocytes Absolute 464 200 - 950 cells/uL   Eosinophils Absolute 58 15 - 500 cells/uL   Basophils Absolute 0 0 - 200 cells/uL   Neutrophils Relative % 70 %   Lymphocytes Relative 21 %   Monocytes Relative 8 %   Eosinophils Relative 1 %   Basophils Relative 0 %   Smear Review Criteria for review not met   COMPLETE METABOLIC PANEL WITH GFR     Status: None   Collection Time: 12/30/15  8:57 AM  Result Value Ref Range   Sodium 139 135 - 146 mmol/L   Potassium 3.8 3.5 - 5.3 mmol/L   Chloride 105 98 - 110 mmol/L   CO2 24 20 - 31 mmol/L   Glucose, Bld 99 65 - 99 mg/dL   BUN 13 7 - 25 mg/dL   Creat 0.89 0.70 - 1.33 mg/dL    Comment:   For patients > or = 50 years of age: The upper reference limit for Creatinine is approximately 13% higher for people identified as African-American.      Total Bilirubin 0.5 0.2 - 1.2 mg/dL   Alkaline Phosphatase 75 40 - 115 U/L   AST 23 10 - 35 U/L   ALT 17 9 - 46 U/L   Total Protein 6.9 6.1 - 8.1 g/dL   Albumin 4.4 3.6 - 5.1 g/dL   Calcium 9.1 8.6 - 10.3 mg/dL   GFR, Est African American >89 >=60 mL/min   GFR, Est Non African American >89 >=60 mL/min  Hemoglobin A1c     Status: None   Collection Time: 12/30/15  8:57 AM  Result Value Ref Range   Hgb A1c  MFr Bld 5.3 <5.7 %    Comment:   For the purpose of screening for the presence of diabetes:   <5.7%       Consistent with the absence of diabetes 5.7-6.4 %   Consistent with increased risk for diabetes (prediabetes) >=6.5 %      Consistent with diabetes   This assay result is consistent with a decreased risk of diabetes.   Currently, no consensus exists regarding use of hemoglobin A1c for diagnosis of diabetes in children.   According to American Diabetes Association (ADA) guidelines, hemoglobin A1c <7.0% represents optimal control in non-pregnant diabetic patients. Different metrics may apply to specific patient populations. Standards of Medical Care in Diabetes (ADA).      Mean Plasma Glucose 105 mg/dL  Lipid panel     Status: Abnormal   Collection Time: 12/30/15  8:57 AM  Result Value Ref Range   Cholesterol 293 (H) 125 - 200 mg/dL   Triglycerides 406 (H) <150 mg/dL   HDL 32 (L) >=40 mg/dL   Total CHOL/HDL Ratio 9.2 (H) <=5.0 Ratio   VLDL NOT CALC <30 mg/dL    Comment:   Not calculated due to Triglyceride >400. Suggest ordering Direct LDL (Unit Code: [phone]).    LDL Cholesterol NOT CALC <130 mg/dL    Comment:   Not calculated due to Triglyceride >400. Suggest ordering Direct LDL (Unit Code: [phone]).   Total Cholesterol/HDL Ratio:CHD Risk                        Coronary Heart Disease Risk Table                                        Men       Women          1/2 Average Risk              3.4        3.3              Average Risk              5.0        4.4           2X Average Risk              9.6        7.1           3X Average Risk             23.4       11.0 Use the calculated Patient Ratio above and the CHD Risk table  to determine the patient's CHD Risk.   T4, free     Status: None   Collection Time: 12/30/15  8:57 AM  Result Value Ref Range   Free T4 1.3 0.8 - 1.8 ng/dL  TSH     Status: None   Collection Time: 12/30/15  8:57 AM  Result Value Ref Range   TSH 1.63 0.40 - 4.50 mIU/L  VITAMIN D 25 Hydroxy (Vit-D Deficiency, Fractures)     Status: Abnormal   Collection Time: 12/30/15  8:57 AM  Result Value Ref Range   Vit D, 25-Hydroxy 25 (L) 30 - 100 ng/mL    Comment: Vitamin D Status            25-OH Vitamin D  Deficiency                <20 ng/mL        Insufficiency         20 - 29 ng/mL        Optimal             > or = 30 ng/mL   For 25-OH Vitamin D testing on patients on D2-supplementation and patients for whom quantitation of D2 and D3 fractions is required, the QuestAssureD 25-OH VIT D, (D2,D3), LC/MS/MS is recommended: order code [phone] (patients > 2 yrs).   Lipid Panel w/reflex Direct LDL     Status: Abnormal   Collection Time: 01/13/16  9:14 AM  Result Value Ref Range   Cholesterol 290 (H) 125 - 200 mg/dL   Triglycerides 374 (H) <150 mg/dL   HDL 32 (L) >=40 mg/dL   Total CHOL/HDL Ratio 9.1 (H) <=5.0 Ratio   LDL Cholesterol 183 (H) <130 mg/dL    Comment:   Total Cholesterol/HDL Ratio:CHD Risk                        Coronary Heart Disease Risk Table                                        Men       Women          1/2 Average Risk              3.4        3.3              Average Risk              5.0        4.4           2X Average Risk              9.6        7.1           3X Average Risk             23.4       11.0 Use the calculated Patient Ratio above and the CHD Risk table  to determine the patient's CHD Risk.

## 2016-01-23 DIAGNOSIS — F1010 Alcohol abuse, uncomplicated: Secondary | ICD-10-CM | POA: Insufficient documentation

## 2016-01-23 DIAGNOSIS — E559 Vitamin D deficiency, unspecified: Secondary | ICD-10-CM | POA: Insufficient documentation

## 2016-01-23 NOTE — Assessment & Plan Note (Addendum)
Pt's Vitamin D level is too low.   Pt needs to take an OTC daily dose of 5,000 IUs of vitamin D3.  Inform patient this will likely be a lifelong thing, and we will monitor it yearly.

## 2016-01-23 NOTE — Assessment & Plan Note (Signed)
Move more!

## 2016-01-23 NOTE — Assessment & Plan Note (Addendum)
>>  ASSESSMENT AND PLAN FOR HYPERTRIGLYCERIDEMIA, FAMILIAL WRITTEN ON 01/23/2016 10:22 AM BY Samreet Edenfield, DO  Pt's risk is >er 7.5%.   Start meds.  Dietary changes such as low saturated & trans fat and low carb/ ketogenic diets discussed with patient.  Encouraged regular exercise and weight loss when appropriate.   Educational handouts provided at patient's desire.  Also, risks and benefits of medications discussed with patient, including alternative treatments.   Encouraged patient to read drug information handouts to further educate self about the medicine prior to starting it.   Contact us prior with any Q's/ concerns.  >>ASSESSMENT AND PLAN FOR HYPERLIPIDEMIA WITH TARGET LDL LESS THAN 70 WRITTEN ON 01/23/2016 10:23 AM BY Sedonia Kitner, DO   check liver enzymes in 6-8 wks after starting Lipitor.   Please try to really cut back alcohol intake and no more than 1 or 2 per day.   Likewise try to drink more water at least 100 ounces per day if you're not sweating or exercising or anything. If you are you'll need more.

## 2016-01-23 NOTE — Assessment & Plan Note (Addendum)
  check liver enzymes in 6-8 wks after starting Lipitor.   Please try to really cut back alcohol intake and no more than 1 or 2 per day.   Likewise try to drink more water at least 100 ounces per day if you're not sweating or exercising or anything. If you are you'll need more.

## 2016-01-23 NOTE — Assessment & Plan Note (Addendum)
Counseling done- advised wt loss.  Use lose it or my fitness pal---> track all foods/ drinks.

## 2016-01-23 NOTE — Assessment & Plan Note (Signed)
Connection btwn TG and ETOH d/c pt

## 2016-03-02 ENCOUNTER — Ambulatory Visit (INDEPENDENT_AMBULATORY_CARE_PROVIDER_SITE_OTHER): Payer: 59 | Admitting: Family Medicine

## 2016-03-02 ENCOUNTER — Encounter: Payer: Self-pay | Admitting: Family Medicine

## 2016-03-02 VITALS — BP 128/83 | HR 69 | Ht 71.75 in | Wt 201.7 lb

## 2016-03-02 DIAGNOSIS — R030 Elevated blood-pressure reading, without diagnosis of hypertension: Secondary | ICD-10-CM | POA: Diagnosis not present

## 2016-03-02 DIAGNOSIS — E782 Mixed hyperlipidemia: Secondary | ICD-10-CM

## 2016-03-02 NOTE — Assessment & Plan Note (Signed)
Pt understands goal of 130 over 80 or less---> declines meds today.   - will monitor at home

## 2016-03-02 NOTE — Patient Instructions (Addendum)
Take at least 4 of the 1200mg  fish oils per day.   Please keep eye on BP's---> goal of 130/ 80 and less on regular basis- if remains elevated - f/up sooner than planned   Guidelines for a Low Sodium Diet   Low Sodium Diet A main source of sodium is table salt. The average American eats five or more teaspoons of salt each day. This is about 20 times as much as the body needs. In fact, your body needs only 1/4 teaspoon of salt every day. Sodium is found naturally in foods, but a lot of it is added during processing and preparation. Many foods that do not taste salty may still be high in sodium. Large amounts of sodium can be hidden in canned, processed and convenience foods. And sodium can be found in many foods that are served at [employer].  Sodium controls fluid balance in our bodies and maintains blood volume and blood pressure. Eating too much sodium may raise blood pressure and cause fluid retention, which could lead to swelling of the legs and feet or other health issues.  When limiting sodium in your diet, a common target is to eat less than 2,000 milligrams of sodium per day.   General Guidelines for Cutting Down on Salt Eliminate salty foods from your diet and reduce the amount of salt used in cooking. Sea salt is no better than regular salt.  Choose low sodium foods. Many salt-free or reduced salt products are available. When reading food labels, low sodium is defined as 140 mg of sodium per serving.  Salt substitutes are sometimes made from potassium, so read the label. If you are on a low potassium diet, then check with your doctor before using those salt substitutes.  Be creative and season your foods with spices, herbs, lemon, garlic, ginger, vinegar and pepper. Remove the salt shaker from the table.  Read ingredient labels to identify foods high in sodium. Items with 400 mg or more of sodium are high in sodium. High sodium food additives include salt, brine, or other  items that say sodium, such as monosodium glutamate.  Eat more home-cooked meals. Foods cooked from scratch are naturally lower in sodium than most instant and boxed mixes.  Don't use softened water for cooking and drinking since it contains added salt.  Avoid medications which contain sodium such as [employer].  For more information; food composition books are available which tell how much sodium is in food. Online sources such as www.calorieking.com also list amounts.     Meats, Poultry, Fish, Legumes, Eggs and Nuts  High-Sodium Foods: Smoked, cured, salted or canned meat, fish or poultry including bacon, cold cuts, ham, frankfurters, sausage, sardines, caviar and anchovies Frozen breaded meats and dinners, such as burritos and pizza Canned entrees, such as ravioli, spam and chili Salted nuts Beans canned with salt added  Low-Sodium Alternatives: Any fresh or frozen beef, lamb, pork, poultry and fish Eggs and egg substitutes Low-sodium peanut butter Dry peas and beans (not canned) Low-sodium canned fish Drained, water or oil packed canned fish or poultry   Dairy Products  High-Sodium Foods: Buttermilk Regular and processed cheese, cheese spreads and sauces Cottage cheese  Low-Sodium Alternatives: Milk, yogurt, ice cream and ice milk Low-sodium cheeses, cream cheese, ricotta cheese and mozzarella   Breads, Grains and Cereals  High-Sodium Foods: Bread and rolls with salted tops Quick breads, self-rising flour, biscuit, pancake and waffle mixes Pizza, croutons and salted crackers Prepackaged, processed  mixes for potatoes, rice, pasta and stuffing  Low-Sodium Alternatives: Breads, bagels and rolls without salted tops Muffins and most ready-to-eat cereals All rice and pasta, but do not to add salt when cooking Low-sodium corn and flour tortillas and noodles Low-sodium crackers and breadsticks Unsalted popcorn, chips and  pretzels      Vegetables and Fruits  High-Sodium Foods: Regular canned vegetables and vegetable juices Olives, pickles, sauerkraut and other pickled vegetables Vegetables made with ham, bacon or salted pork Packaged mixes, such as scalloped or au gratin potatoes, frozen hash browns and Tater Tots Commercially prepared pasta and tomato sauces and salsa  Low-Sodium Alternatives: Fresh and frozen vegetables without sauces Low-sodium canned vegetables, sauces and juices Fresh potatoes, frozen [place] fries and instant mashed potatoes Low-salt tomato or V-8 juice. Most fresh, frozen and canned fruit Dried fruits   Soups  High-Sodium Foods: Regular canned and dehydrated soup, broth and bouillon Cup of noodles and seasoned ramen mixes  Low-Sodium Alternatives: Low-sodium canned and dehydrated soups, broth and bouillon Homemade soups without added salt   Fats, Desserts and Sweets  High-Sodium Foods: Soy sauce, seasoning salt, other sauces and marinades Bottled salad dressings, regular salad dressing with bacon bits Salted butter or margarine Instant pudding and cake Large portions of ketchup, mustard  Low-Sodium Alternatives: Vinegar, unsalted butter or margarine Vegetable oils and low sodium sauces and salad dressings Mayonnaise All desserts made without salt     DASH Eating Plan DASH stands for "Dietary Approaches to Stop Hypertension." The DASH eating plan is a healthy eating plan that has been shown to reduce high blood pressure (hypertension). Additional health benefits may include reducing the risk of type 2 diabetes mellitus, heart disease, and stroke. The DASH eating plan may also help with weight loss. What do I need to know about the DASH eating plan? For the DASH eating plan, you will follow these general guidelines:  Choose foods with less than 150 milligrams of sodium per serving (as listed on the food label).  Use salt-free seasonings or herbs instead  of table salt or sea salt.  Check with your health care provider or pharmacist before using salt substitutes.  Eat lower-sodium products. These are often labeled as "low-sodium" or "no salt added."  Eat fresh foods. Avoid eating a lot of canned foods.  Eat more vegetables, fruits, and low-fat dairy products.  Choose whole grains. Look for the word "whole" as the first word in the ingredient list.  Choose fish and skinless chicken or [place] more often than red meat. Limit fish, poultry, and meat to 6 oz (170 g) each day.  Limit sweets, desserts, sugars, and sugary drinks.  Choose heart-healthy fats.  Eat more home-cooked food and less restaurant, buffet, and fast food.  Limit fried foods.  Do not fry foods. Cook foods using methods such as baking, boiling, grilling, and broiling instead.  When eating at a restaurant, ask that your food be prepared with less salt, or no salt if possible. What foods can I eat? Seek help from a dietitian for individual calorie needs. Grains  Whole grain or whole wheat bread. Brown rice. Whole grain or whole wheat pasta. Quinoa, bulgur, and whole grain cereals. Low-sodium cereals. Corn or whole wheat flour tortillas. Whole grain cornbread. Whole grain crackers. Low-sodium crackers. Vegetables  Fresh or frozen vegetables (raw, steamed, roasted, or grilled). Low-sodium or reduced-sodium tomato and vegetable juices. Low-sodium or reduced-sodium tomato sauce and paste. Low-sodium or reduced-sodium canned vegetables. Fruits  All fresh, canned (in natural  juice), or frozen fruits. Meat and Other Protein Products  Ground beef (85% or leaner), grass-fed beef, or beef trimmed of fat. Skinless chicken or [place]. Ground chicken or [place]. Pork trimmed of fat. All fish and seafood. Eggs. Dried beans, peas, or lentils. Unsalted nuts and seeds. Unsalted canned beans. Dairy  Low-fat dairy products, such as skim or 1% milk, 2% or reduced-fat cheeses, low-fat ricotta  or cottage cheese, or plain low-fat yogurt. Low-sodium or reduced-sodium cheeses. Fats and Oils  Tub margarines without trans fats. Light or reduced-fat mayonnaise and salad dressings (reduced sodium). Avocado. Safflower, olive, or canola oils. Natural peanut or almond butter. Other  Unsalted popcorn and pretzels. The items listed above may not be a complete list of recommended foods or beverages. Contact your dietitian for more options.  What foods are not recommended? Grains  White bread. White pasta. White rice. Refined cornbread. Bagels and croissants. Crackers that contain trans fat. Vegetables  Creamed or fried vegetables. Vegetables in a cheese sauce. Regular canned vegetables. Regular canned tomato sauce and paste. Regular tomato and vegetable juices. Fruits  Canned fruit in light or heavy syrup. Fruit juice. Meat and Other Protein Products  Fatty cuts of meat. Ribs, chicken wings, bacon, sausage, bologna, salami, chitterlings, fatback, hot dogs, bratwurst, and packaged luncheon meats. Salted nuts and seeds. Canned beans with salt. Dairy  Whole or 2% milk, cream, half-and-half, and cream cheese. Whole-fat or sweetened yogurt. Full-fat cheeses or blue cheese. Nondairy creamers and whipped toppings. Processed cheese, cheese spreads, or cheese curds. Condiments  Onion and garlic salt, seasoned salt, table salt, and sea salt. Canned and packaged gravies. Worcestershire sauce. Tartar sauce. Barbecue sauce. Teriyaki sauce. Soy sauce, including reduced sodium. Steak sauce. Fish sauce. Oyster sauce. Cocktail sauce. Horseradish. Ketchup and mustard. Meat flavorings and tenderizers. Bouillon cubes. Hot sauce. Tabasco sauce. Marinades. Taco seasonings. Relishes. Fats and Oils  Butter, stick margarine, lard, shortening, ghee, and bacon fat. Coconut, palm kernel, or palm oils. Regular salad dressings. Other  Pickles and olives. Salted popcorn and pretzels. The items listed above may not be a  complete list of foods and beverages to avoid. Contact your dietitian for more information.  Where can I find more information? National Heart, Lung, and Blood Institute: travelstabloid.com This information is not intended to replace advice given to you by your health care provider. Make sure you discuss any questions you have with your health care provider. Document Released: 02/17/2011 Document Revised: 08/06/2015 Document Reviewed: 01/02/2013 Elsevier Interactive Patient Education  2017 [employer].

## 2016-03-02 NOTE — Progress Notes (Signed)
Impression and Recommendations:    1. Mixed hyperlipidemia   2. Elevated blood pressure reading without diagnosis of hypertension      Tolerating medications - recheck lfts and lipid panel to see if at goal. Continue to work on diet changes  Will monitor bp; low sodium diet recommended. Aerobic activity encouraged  Elevated blood pressure reading without diagnosis of hypertension Pt understands goal of 130 over 80 or less---> declines meds today.   - will monitor at home    Orders Placed This Encounter  Procedures  . ALT     New Prescriptions   No medications on file    Modified Medications   No medications on file    Discontinued Medications   OMEGA-3 ACID ETHYL ESTERS (LOVAZA) 1 G CAPSULE    Take 2 capsules (2 g total) by mouth 2 (two) times daily.    The patient was counseled, risk factors were discussed, anticipatory guidance given.  Gross side effects, risk and benefits, and alternatives of medications and treatment plan in general discussed with patient.  Patient is aware that all medications have potential side effects and we are unable to predict every side effect or drug-drug interaction that may occur.   Patient will call with any questions prior to using medication if they have concerns.  Expresses verbal understanding and consents to current therapy and treatment regimen.  No barriers to understanding were identified.  Red flag symptoms and signs discussed in detail.  Patient expressed understanding regarding what to do in case of emergency\urgent symptoms  No Follow-up on file.  Please see AVS handed out to patient at the end of our visit for further patient instructions/ counseling done pertaining to today's office visit.    Note: This document was prepared using Dragon voice recognition software and may include unintentional dictation  errors.   --------------------------------------------------------------------------------------------------------------------------------------------------------------------------------------------------------------------------------------------    Subjective:    CC:  Chief Complaint  Patient presents with  . Hyperlipidemia    HPI: Edward Pierce is a 50 y.o. male who presents to [place] at [facility] today for issues as discussed below.   Started lipitor last OV.  tol well.    Drinking more water-- >   Fish oil-- lovaza outrageous price so pt started fish oil OTC  Taking vit D daily-- 5k IU.   Bp up - pt only sleeping on average- 6 hrs per night, but lately since busy at work  ave of 4 -5hrs.     BP at home had been running around.    Wt Readings from Last 3 Encounters:  03/02/16 201 lb 11.2 oz (91.5 kg)  01/19/16 199 lb (90.3 kg)  12/29/15 195 lb 9.6 oz (88.7 kg)   BP Readings from Last 3 Encounters:  03/02/16 128/83  01/19/16 123/79  12/29/15 131/83   Pulse Readings from Last 3 Encounters:  03/02/16 69  01/19/16 63  12/29/15 74   BMI Readings from Last 3 Encounters:  03/02/16 27.55 kg/m  01/19/16 27.18 kg/m  12/29/15 26.71 kg/m     Patient Care Team    Relationship Specialty Notifications Start End  Mellody Dance, DO PCP - General Family Medicine  12/29/15   Leandrew Koyanagi, MD Attending Physician Orthopedic Surgery  12/29/15     Patient Active Problem List   Diagnosis Date Noted  . Elevated blood pressure reading without diagnosis of hypertension 03/02/2016  . Vitamin D deficiency 01/23/2016  . Excessive drinking of alcohol at times  01/23/2016  . Hypertriglyceridemia, familial 01/19/2016  . Hyperlipemia, mixed 01/19/2016  . Low HDL (under 40) 01/19/2016  . Overweight (BMI 25.0-29.9) 12/29/2015  . Periodic heart flutter 12/29/2015    Past Medical history, Surgical history, Family history, Social history, Allergies and  Medications have been entered into the medical record, reviewed and changed as needed.   Allergies:  No Known Allergies  Review of Systems  Constitutional: Negative for chills and fever.  HENT: Negative for congestion and sinus pain.   Eyes: Negative for blurred vision and double vision.  Respiratory: Negative for shortness of breath and wheezing.   Cardiovascular: Negative for chest pain and palpitations.  Gastrointestinal: Negative for constipation, diarrhea, heartburn, nausea and vomiting.  Musculoskeletal: Negative for joint pain.       Chronic jt pains  Skin: Negative for rash.  Neurological: Negative for dizziness and focal weakness.  Endo/Heme/Allergies: Negative for polydipsia.  Psychiatric/Behavioral: Negative for depression. The patient is not nervous/anxious and does not have insomnia.      Objective:   Blood pressure 128/83, pulse 69, height 5' 11.75" (1.822 m), weight 201 lb 11.2 oz (91.5 kg). Body mass index is 27.55 kg/m. General: Well Developed, well nourished, appropriate for stated age.  Neuro: Alert and oriented x3, extra-ocular muscles intact, sensation grossly intact.  HEENT: Normocephalic, atraumatic, neck supple, no carotid bruits appreciated  Skin: no gross rash. Cardiac: RRR, S1 S2 Respiratory: ECTA B/L, Not using accessory muscles, speaking in full sentences-unlabored. Vascular:  Ext warm, dry, pink; cap RF less 2 sec. Psych: No HI/SI, judgement and insight good, Euthymic mood. Full Affect.

## 2016-03-03 LAB — ALT: ALT: 19 U/L (ref 9–46)

## 2016-06-22 ENCOUNTER — Encounter: Payer: 59 | Admitting: Family Medicine

## 2016-07-18 ENCOUNTER — Ambulatory Visit (INDEPENDENT_AMBULATORY_CARE_PROVIDER_SITE_OTHER): Payer: BLUE CROSS/BLUE SHIELD | Admitting: Family Medicine

## 2016-07-18 ENCOUNTER — Encounter: Payer: Self-pay | Admitting: Family Medicine

## 2016-07-18 VITALS — BP 129/80 | HR 70 | Ht 71.75 in | Wt 199.5 lb

## 2016-07-18 DIAGNOSIS — Z125 Encounter for screening for malignant neoplasm of prostate: Secondary | ICD-10-CM | POA: Insufficient documentation

## 2016-07-18 DIAGNOSIS — E782 Mixed hyperlipidemia: Secondary | ICD-10-CM | POA: Diagnosis not present

## 2016-07-18 DIAGNOSIS — R030 Elevated blood-pressure reading, without diagnosis of hypertension: Secondary | ICD-10-CM | POA: Diagnosis not present

## 2016-07-18 DIAGNOSIS — E559 Vitamin D deficiency, unspecified: Secondary | ICD-10-CM | POA: Diagnosis not present

## 2016-07-18 DIAGNOSIS — Z1211 Encounter for screening for malignant neoplasm of colon: Secondary | ICD-10-CM | POA: Diagnosis not present

## 2016-07-18 DIAGNOSIS — Z1389 Encounter for screening for other disorder: Secondary | ICD-10-CM | POA: Diagnosis not present

## 2016-07-18 DIAGNOSIS — E786 Lipoprotein deficiency: Secondary | ICD-10-CM | POA: Diagnosis not present

## 2016-07-18 DIAGNOSIS — Z0000 Encounter for general adult medical examination without abnormal findings: Secondary | ICD-10-CM | POA: Diagnosis not present

## 2016-07-18 DIAGNOSIS — Z1283 Encounter for screening for malignant neoplasm of skin: Secondary | ICD-10-CM

## 2016-07-18 LAB — POC HEMOCCULT BLD/STL (OFFICE/1-CARD/DIAGNOSTIC): Fecal Occult Blood, POC: NEGATIVE

## 2016-07-18 NOTE — Addendum Note (Signed)
Addended by: Fonnie Mu on: 07/18/2016 01:01 PM   Modules accepted: Orders

## 2016-07-18 NOTE — Progress Notes (Signed)
Male physical  Impression and Recommendations:    1. Encounter for wellness examination   2. Colon cancer screening   3. Screening for multiple conditions   4. Screening exam for skin cancer   5. Vitamin D deficiency   6. Hyperlipemia, mixed   7. Low HDL (under 40)   8. Elevated blood pressure reading without diagnosis of hypertension    1) labs today- see orders  1) Anticipatory Guidance: Discussed importance of wearing a seatbelt while driving, not texting while driving;   sunscreen when outside along with skin surveillance; eating a balanced and modest diet; physical activity at least 25 minutes per day or 150 min/ week moderate to intense activity.  2) Immunizations / Screenings / Labs:  All immunizations are up-to-date per recommendations or will be updated today. Patient is due for dental and vision screens which pt will schedule independently.  3) Weight:  BMI meaning discussed with patient.  Discussed goal of losing 5-10% of current body weight which would improve overall feelings of well being and improve objective health data. Improve nutrient density of diet through increasing intake of fruits and vegetables and decreasing saturated fats, white flour products and refined sugars.   Follow-up preventative CPE in 1 year. Follow-up office visit pending lab work.  F/up sooner for chronic care management and/or prn  - pt with additional concerns today which he will make f/up to discuss.  Handouts given to pt for informative purposes only  Orders Placed This Encounter  Procedures  . Lipid panel    Order Specific Question:   Has the patient fasted?    Answer:   Yes  . ALT  . AST  . Basic metabolic panel    Order Specific Question:   Has the patient fasted?    Answer:   Yes  . VITAMIN D 25 Hydroxy (Vit-D Deficiency, Fractures)     1) . Ambulatory referral to Gastroenterology-  Routine colonoscopy needed    Referral Priority:   Routine    Referral Type:    Consultation    Referral Reason:   Specialty Services Required    Number of Visits Requested:   1    .2)  Ambulatory referral to Dermatology- pt asks for referral for routine skin care as family also goes there    Referred to Provider:   [place], Amy, MD      Patient's Medications  New Prescriptions   No medications on file  Previous Medications   ATORVASTATIN (LIPITOR) 40 MG TABLET    Take 1 tablet (40 mg total) by mouth at bedtime.   CHOLECALCIFEROL (VITAMIN D) 1000 UNITS TABLET    Take 5,000 Units by mouth daily.   OMEGA-3 FATTY ACIDS (FISH OIL) 500 MG CAPS    Take 3 capsules by mouth daily.  Modified Medications   No medications on file  Discontinued Medications   No medications on file     Please see AVS handed out to patient at the end of our visit for further patient instructions/ counseling done pertaining to today's office visit.    Subjective:    CC: CPE  HPI: Edward Pierce is a 51 y.o. male who presents to [place] at [facility] today for a yearly health maintenance exam.     Health Maintenance Summary Reviewed and updated, unless pt declines services.  Colonoscopy:     Never had done; no fam hx Tdap: UTD CT scan for screening lung CA:   n/a  Abdominal Ultrasound:     Unnecessary secondary to < 65  Alcohol:    No concerns, no excessive use Exercise Habits:   None right now--- too busy at work STD concerns:   None- monogamous Drug Use:   None Birth control method:   n/a Testicular/penile concerns:     None;  no ED sx,  urinary sx etc    Wt Readings from Last 3 Encounters:  07/18/16 199 lb 8 oz (90.5 kg)  03/02/16 201 lb 11.2 oz (91.5 kg)  01/19/16 199 lb (90.3 kg)   BP Readings from Last 3 Encounters:  07/18/16 129/80  03/02/16 128/83  01/19/16 123/79   Pulse Readings from Last 3 Encounters:  07/18/16 70  03/02/16 69  01/19/16 63    Patient Active Problem List   Diagnosis Date Noted  . Colon cancer screening 07/18/2016  .  Elevated blood pressure reading without diagnosis of hypertension 03/02/2016  . Vitamin D deficiency 01/23/2016  . Excessive drinking of alcohol at times 01/23/2016  . Hypertriglyceridemia, familial 01/19/2016  . Hyperlipemia, mixed 01/19/2016  . Low HDL (under 40) 01/19/2016  . Overweight (BMI 25.0-29.9) 12/29/2015  . Periodic heart flutter 12/29/2015    Past Medical History:  Diagnosis Date  . Hyperlipidemia     Past Surgical History:  Procedure Laterality Date  . HERNIA REPAIR      Family History  Problem Relation Age of Onset  . Cancer Mother     breast  . Diabetes Mother   . Hypertension Mother   . Heart disease Mother   . Hyperlipidemia Father   . COPD Father   . Heart attack Paternal Grandfather     History  Drug Use No  ,  History  Alcohol Use  . 1.8 oz/week  . 1 Glasses of wine, 2 Cans of beer per week  ,  History  Smoking Status  . Former Smoker  . Packs/day: 0.25  . Years: 13.00  . Types: Cigarettes  . Quit date: 02/12/1996  Smokeless Tobacco  . Never Used  ,  History  Sexual Activity  . Sexual activity: Yes  . Birth control/ protection: None    Patient's Medications  New Prescriptions   No medications on file  Previous Medications   ATORVASTATIN (LIPITOR) 40 MG TABLET    Take 1 tablet (40 mg total) by mouth at bedtime.   CHOLECALCIFEROL (VITAMIN D) 1000 UNITS TABLET    Take 5,000 Units by mouth daily.   OMEGA-3 FATTY ACIDS (FISH OIL) 500 MG CAPS    Take 3 capsules by mouth daily.  Modified Medications   No medications on file  Discontinued Medications   No medications on file    Patient has no known allergies.  Review of Systems  Constitutional: Negative for chills, fever and malaise/fatigue.  HENT: Negative for congestion and tinnitus.   Eyes: Negative for blurred vision, double vision and photophobia.  Respiratory: Negative for cough and wheezing.   Cardiovascular: Negative for chest pain, palpitations and leg swelling.    Gastrointestinal: Negative for abdominal pain, blood in stool, constipation, diarrhea, nausea and vomiting.  Genitourinary: Negative for dysuria, frequency and urgency.  Musculoskeletal: Negative for joint pain and myalgias.  Skin: Negative for itching and rash.  Neurological: Negative for dizziness, focal weakness, weakness and headaches.  Endo/Heme/Allergies: Negative for environmental allergies and polydipsia. Does not bruise/bleed easily.  Psychiatric/Behavioral: Negative for depression and memory loss. The patient is not nervous/anxious and does not have insomnia.  Objective:     Blood pressure 129/80, pulse 70, height 5' 11.75" (1.822 m), weight 199 lb 8 oz (90.5 kg). Body mass index is 27.25 kg/m. General Appearance:    Alert, cooperative, no distress, appears stated age  Head:    Normocephalic, without obvious abnormality, atraumatic  Eyes:    PERRL, conjunctiva/corneas clear, EOM's intact, fundi    benign, both eyes  Ears:    Normal TM's and external ear canals, both ears  Nose:   Nares normal, septum midline, mucosa normal, no drainage    or sinus tenderness  Throat:   Lips w/o lesion, mucosa moist, and tongue normal; teeth and   gums normal  Neck:   Supple, symmetrical, trachea midline, no adenopathy;    thyroid:  no enlargement/tenderness/nodules; no carotid   bruit or JVD  Back:     Symmetric, no curvature, ROM normal, no CVA tenderness  Lungs:     Clear to auscultation bilaterally, respirations unlabored, no       Wh/ R/ R  Chest Wall:    No tenderness or gross deformity; normal excursion   Heart:    Regular rate and rhythm, S1 and S2 normal, no murmur, rub   or gallop  Abdomen:     Soft, non-tender, bowel sounds active all four quadrants, NO   G/R/R, no masses, no organomegaly  Genitalia:    Ext genitalia: without lesion, no penile rash or discharge, no hernias appreciated   Rectal:    Normal tone, prostate WNL's and equal b/l, no tenderness; guaiac negative  stool  Extremities:   Extremities normal, atraumatic, no cyanosis or gross edema  Pulses:   2+ and symmetric all extremities  Skin:   Warm, dry, Skin color, texture, turgor normal, no obvious rashes or lesions  M-Sk:   Ambulates * 4 w/o difficulty, no gross deformities, tone WNL  Neurologic:   CNII-XII intact, normal strength, sensation and reflexes    Throughout Psych:  No HI/SI, judgement and insight good, Euthymic mood. Full Affect.

## 2016-07-18 NOTE — Patient Instructions (Addendum)
- please recheck BP prior to pt leaving today.     Terbinafine tablets What is this medicine? TERBINAFINE (TER bin a feen) is an antifungal medicine. It is used to treat certain kinds of fungal or yeast infections. This medicine may be used for other purposes; ask your health care provider or pharmacist if you have questions. COMMON BRAND NAME(S): Lamisil, Terbinex What should I tell my health care provider before I take this medicine? They need to know if you have any of these conditions: -drink alcoholic beverages -kidney disease -liver disease -an unusual or allergic reaction to terbinafine, other medicines, foods, dyes, or preservatives -pregnant or trying to get pregnant -breast-feeding How should I use this medicine? Take this medicine by mouth with a full glass of water. Follow the directions on the prescription label. You can take this medicine with food or on an empty stomach. Take your medicine at regular intervals. Do not take your medicine more often than directed. Do not skip doses or stop your medicine early even if you feel better. Do not stop taking except on your doctor's advice. Talk to your pediatrician regarding the use of this medicine in children. Special care may be needed. Overdosage: If you think you have taken too much of this medicine contact a poison control center or emergency room at once. NOTE: This medicine is only for you. Do not share this medicine with others. What if I miss a dose? If you miss a dose, take it as soon as you can. If it is almost time for your next dose, take only that dose. Do not take double or extra doses. What may interact with this medicine? Do not take this medicine with any of the following medications: -thioridazine This medicine may also interact with the following medications: -beta-blockers -caffeine -cimetidine -cyclosporine -medicines for depression, anxiety, or psychotic disturbances -medicines for fungal infections  like fluconazole and ketoconazole -medicines for irregular heartbeat like amiodarone, flecainide and propafenone -rifampin -warfarin This list may not describe all possible interactions. Give your health care provider a list of all the medicines, herbs, non-prescription drugs, or dietary supplements you use. Also tell them if you smoke, drink alcohol, or use illegal drugs. Some items may interact with your medicine. What should I watch for while using this medicine? Visit your doctor or health care provider regularly. Tell your doctor right away if you have nausea or vomiting, loss of appetite, stomach pain on your right upper side, yellow skin, dark urine, light stools, or are over tired. Some fungal infections need many weeks or months of treatment to cure. If you are taking this medicine for a long time, you will need to have important blood work done. What side effects may I notice from receiving this medicine? Side effects that you should report to your doctor or health care professional as soon as possible: -allergic reactions like skin rash or hives, swelling of the face, lips, or tongue -changes in vision -dark urine -fever or infection -general ill feeling or flu-like symptoms -light-colored stools -loss of appetite, nausea -redness, blistering, peeling or loosening of the skin, including inside the mouth -right upper belly pain -unusually weak or tired -yellowing of the eyes or skin Side effects that usually do not require medical attention (report to your doctor or health care professional if they continue or are bothersome): -changes in taste -diarrhea -hair loss -muscle or joint pain -stomach gas -stomach upset This list may not describe all possible side effects. Call your doctor for  medical advice about side effects. You may report side effects to FDA at 1-800-FDA-1088. Where should I keep my medicine? Keep out of the reach of children. Store at room temperature below 25  degrees C (77 degrees F). Protect from light. Throw away any unused medicine after the expiration date. NOTE: This sheet is a summary. It may not cover all possible information. If you have questions about this medicine, talk to your doctor, pharmacist, or health care provider.  2018 Elsevier/Gold Standard (2007-05-11 16:28:07)     Onychomycosis/Fungal Toenails  WHAT IS IT? An infection that lies within the keratin of your nail plate that is caused by a fungus.  WHY ME? Fungal infections affect all ages, sexes, races, and creeds.  There may be many factors that predispose you to a fungal infection such as age, coexisting medical conditions such as diabetes, or an autoimmune disease; stress, medications, fatigue, genetics, etc.  Bottom line: fungus thrives in a warm, moist environment and your shoes offer such a location.  IS IT CONTAGIOUS? Theoretically, yes.  You do not want to share shoes, nail clippers or files with someone who has fungal toenails.  Walking around barefoot in the same room or sleeping in the same bed is unlikely to transfer the organism.  It is important to realize, however, that fungus can spread easily from one nail to the next on the same foot.  HOW DO WE TREAT THIS?  There are several ways to treat this condition.  Treatment may depend on many factors such as age, medications, pregnancy, liver and kidney conditions, etc.  It is best to ask your doctor which options are available to you.  1. No treatment.   Unlike many other medical concerns, you can live with this condition.  However for many people this can be a painful condition and may lead to ingrown toenails or a bacterial infection.  It is recommended that you keep the nails cut short to help reduce the amount of fungal nail. 2. Topical treatment.  These range from herbal remedies to prescription strength nail lacquers.  About 40-50% effective, topicals require twice daily application for approximately 9 to 12 months  or until an entirely new nail has grown out.  The most effective topicals are medical grade medications available through physicians offices. 3. Oral antifungal medications.  With an 80-90% cure rate, the most common oral medication requires 3 to 4 months of therapy and stays in your system for a year as the new nail grows out.  Oral antifungal medications do require blood work to make sure it is a safe drug for you.  A liver function panel will be performed prior to starting the medication and after the first month of treatment.  It is important to have the blood work performed to avoid any harmful side effects.  In general, this medication safe but blood work is required. 4. Laser Therapy.  This treatment is performed by applying a specialized laser to the affected nail plate.  This therapy is noninvasive, fast, and non-painful.  It is not covered by insurance and is therefore, out of pocket.  The results have been very good with a 80-95% cure rate.  [facility] is the only practice in the area to offer this therapy. Permanent Nail Avulsion.  Removing the entire nail so that a new nail will not grow back.Preventing Toenail Fungus from Recurring   Sanitize your shoes with Mycomist spray or a similar shoe sanitizer spray.  Follow the  instructions on the bottle and dry them outside in the sun or with a hairdryer.  We also recommend repeating the sanitization once weekly in shoes you wear most often.   Throw away any shoes you have worn a significant amount without socks-fungus thrives in a warm moist environment and you want to avoid re-infection after your laser procedure   Bleach your socks with regular or color safe bleach   Change your socks regularly to keep your feet clean and dry (especially if you have sweaty feet)-if sweaty feet are a problem, let your doctor know-there is a great lotion that helps with this problem.   Clean your toenail clippers with alcohol before you use them  if you do your own toenails and make sure to replace [place] boards and orange sticks regularly   If you get regular pedicures, bring your own instruments or go to a spa that sterilizes their instruments in an autoclave.  Preventive Care for Adults, Male A healthy lifestyle and preventive care can promote health and wellness. Preventive health guidelines for men include the following key practices:  A routine yearly physical is a good way to check with your health care provider about your health and preventative screening. It is a chance to share any concerns and updates on your health and to receive a thorough exam.  Visit your dentist for a routine exam and preventative care every 6 months. Brush your teeth twice a day and floss once a day. Good oral hygiene prevents tooth decay and gum disease.  The frequency of eye exams is based on your age, health, family medical history, use of contact lenses, and other factors. Follow your health care provider's recommendations for frequency of eye exams.  Eat a healthy diet. Foods such as vegetables, fruits, whole grains, low-fat dairy products, and lean protein foods contain the nutrients you need without too many calories. Decrease your intake of foods high in solid fats, added sugars, and salt. Eat the right amount of calories for you.Get information about a proper diet from your health care provider, if necessary.  Regular physical exercise is one of the most important things you can do for your health. Most adults should get at least 150 minutes of moderate-intensity exercise (any activity that increases your heart rate and causes you to sweat) each week. In addition, most adults need muscle-strengthening exercises on 2 or more days a week.  Maintain a healthy weight. The body mass index (BMI) is a screening tool to identify possible weight problems. It provides an estimate of body fat based on height and weight. Your health care provider can find your  BMI and can help you achieve or maintain a healthy weight.For adults 20 years and older:  A BMI below 18.5 is considered underweight.  A BMI of 18.5 to 24.9 is normal.  A BMI of 25 to 29.9 is considered overweight.  A BMI of 30 and above is considered obese.  Maintain normal blood lipids and cholesterol levels by exercising and minimizing your intake of saturated fat. Eat a balanced diet with plenty of fruit and vegetables. Blood tests for lipids and cholesterol should begin at age 54 and be repeated every 5 years. If your lipid or cholesterol levels are high, you are over 50, or you are at high risk for heart disease, you may need your cholesterol levels checked more frequently.Ongoing high lipid and cholesterol levels should be treated with medicines if diet and exercise are not working.  If you smoke,  find out from your health care provider how to quit. If you do not use tobacco, do not start.  Lung cancer screening is recommended for adults aged 20-80 years who are at high risk for developing lung cancer because of a history of smoking. A yearly low-dose CT scan of the lungs is recommended for people who have at least a 30-pack-year history of smoking and are a current smoker or have quit within the past 15 years. A pack year of smoking is smoking an average of 1 pack of cigarettes a day for 1 year (for example: 1 pack a day for 30 years or 2 packs a day for 15 years). Yearly screening should continue until the smoker has stopped smoking for at least 15 years. Yearly screening should be stopped for people who develop a health problem that would prevent them from having lung cancer treatment.  If you choose to drink alcohol, do not have more than 2 drinks per day. One drink is considered to be 12 ounces (355 mL) of beer, 5 ounces (148 mL) of wine, or 1.5 ounces (44 mL) of liquor.  Avoid use of street drugs. Do not share needles with anyone. Ask for help if you need support or instructions  about stopping the use of drugs.  High blood pressure causes heart disease and increases the risk of stroke. Your blood pressure should be checked at least every 1-2 years. Ongoing high blood pressure should be treated with medicines, if weight loss and exercise are not effective.  If you are 4-70 years old, ask your health care provider if you should take aspirin to prevent heart disease.  Diabetes screening is done by taking a blood sample to check your blood glucose level after you have not eaten for a certain period of time (fasting). If you are not overweight and you do not have risk factors for diabetes, you should be screened once every 3 years starting at age 62. If you are overweight or obese and you are 79-68 years of age, you should be screened for diabetes every year as part of your cardiovascular risk assessment.  Colorectal cancer can be detected and often prevented. Most routine colorectal cancer screening begins at the age of 69 and continues through age 66. However, your health care provider may recommend screening at an earlier age if you have risk factors for colon cancer. On a yearly basis, your health care provider may provide home test kits to check for hidden blood in the stool. Use of a small camera at the end of a tube to directly examine the colon (sigmoidoscopy or colonoscopy) can detect the earliest forms of colorectal cancer. Talk to your health care provider about this at age 54, when routine screening begins. Direct exam of the colon should be repeated every 5-10 years through age 49, unless early forms of precancerous polyps or small growths are found.  People who are at an increased risk for hepatitis B should be screened for this virus. You are considered at high risk for hepatitis B if:  You were born in a country where hepatitis B occurs often. Talk with your health care provider about which countries are considered high risk.  Your parents were born in a high-risk  country and you have not received a shot to protect against hepatitis B (hepatitis B vaccine).  You have HIV or AIDS.  You use needles to inject street drugs.  You live with, or have sex with, someone who has hepatitis  B.  You are a man who has sex with other men (MSM).  You get hemodialysis treatment.  You take certain medicines for conditions such as cancer, organ transplantation, and autoimmune conditions.  Hepatitis C blood testing is recommended for all people born from [birth year] through 1965 and any individual with known risks for hepatitis C.  Practice safe sex. Use condoms and avoid high-risk sexual practices to reduce the spread of sexually transmitted infections (STIs). STIs include gonorrhea, chlamydia, syphilis, trichomonas, herpes, HPV, and human immunodeficiency virus (HIV). Herpes, HIV, and HPV are viral illnesses that have no cure. They can result in disability, cancer, and death.  If you are a man who has sex with other men, you should be screened at least once per year for:  HIV.  Urethral, rectal, and pharyngeal infection of gonorrhea, chlamydia, or both.  If you are at risk of being infected with HIV, it is recommended that you take a prescription medicine daily to prevent HIV infection. This is called preexposure prophylaxis (PrEP). You are considered at risk if:  You are a man who has sex with other men (MSM) and have other risk factors.  You are a heterosexual man, are sexually active, and are at increased risk for HIV infection.  You take drugs by injection.  You are sexually active with a partner who has HIV.  Talk with your health care provider about whether you are at high risk of being infected with HIV. If you choose to begin PrEP, you should first be tested for HIV. You should then be tested every 3 months for as long as you are taking PrEP.  A one-time screening for abdominal aortic aneurysm (AAA) and surgical repair of large AAAs by ultrasound are  recommended for men ages 79 to 41 years who are current or former smokers.  Healthy men should no longer receive prostate-specific antigen (PSA) blood tests as part of routine cancer screening. Talk with your health care provider about prostate cancer screening.  Testicular cancer screening is not recommended for adult males who have no symptoms. Screening includes self-exam, a health care provider exam, and other screening tests. Consult with your health care provider about any symptoms you have or any concerns you have about testicular cancer.  Use sunscreen. Apply sunscreen liberally and repeatedly throughout the day. You should seek shade when your shadow is shorter than you. Protect yourself by wearing long sleeves, pants, a wide-brimmed hat, and sunglasses year round, whenever you are outdoors.  Once a month, do a whole-body skin exam, using a mirror to look at the skin on your back. Tell your health care provider about new moles, moles that have irregular borders, moles that are larger than a pencil eraser, or moles that have changed in shape or color.  Stay current with required vaccines (immunizations).  Influenza vaccine. All adults should be immunized every year.  Tetanus, diphtheria, and acellular pertussis (Td, Tdap) vaccine. An adult who has not previously received Tdap or who does not know his vaccine status should receive 1 dose of Tdap. This initial dose should be followed by tetanus and diphtheria toxoids (Td) booster doses every 10 years. Adults with an unknown or incomplete history of completing a 3-dose immunization series with Td-containing vaccines should begin or complete a primary immunization series including a Tdap dose. Adults should receive a Td booster every 10 years.  Varicella vaccine. An adult without evidence of immunity to varicella should receive 2 doses or a second dose if he has  previously received 1 dose.  Human papillomavirus (HPV) vaccine. Males aged 11-21  years who have not received the vaccine previously should receive the 3-dose series. Males aged 22-26 years may be immunized. Immunization is recommended through the age of 29 years for any male who has sex with males and did not get any or all doses earlier. Immunization is recommended for any person with an immunocompromised condition through the age of 74 years if he did not get any or all doses earlier. During the 3-dose series, the second dose should be obtained 4-8 weeks after the first dose. The third dose should be obtained 24 weeks after the first dose and 16 weeks after the second dose.  Zoster vaccine. One dose is recommended for adults aged 62 years or older unless certain conditions are present.  Measles, mumps, and rubella (MMR) vaccine. Adults born before [birth year] generally are considered immune to measles and mumps. Adults born in [birth year] or later should have 1 or more doses of MMR vaccine unless there is a contraindication to the vaccine or there is laboratory evidence of immunity to each of the three diseases. A routine second dose of MMR vaccine should be obtained at least 28 days after the first dose for students attending postsecondary schools, health care workers, or international travelers. People who received inactivated measles vaccine or an unknown type of measles vaccine during 1963-1967 should receive 2 doses of MMR vaccine. People who received inactivated mumps vaccine or an unknown type of mumps vaccine before 1979 and are at high risk for mumps infection should consider immunization with 2 doses of MMR vaccine. Unvaccinated health care workers born before [birth year] who lack laboratory evidence of measles, mumps, or rubella immunity or laboratory confirmation of disease should consider measles and mumps immunization with 2 doses of MMR vaccine or rubella immunization with 1 dose of MMR vaccine.  Pneumococcal 13-valent conjugate (PCV13) vaccine. When indicated, a person who is uncertain of his  immunization history and has no record of immunization should receive the PCV13 vaccine. All adults 92 years of age and older should receive this vaccine. An adult aged 54 years or older who has certain medical conditions and has not been previously immunized should receive 1 dose of PCV13 vaccine. This PCV13 should be followed with a dose of pneumococcal polysaccharide (PPSV23) vaccine. Adults who are at high risk for pneumococcal disease should obtain the PPSV23 vaccine at least 8 weeks after the dose of PCV13 vaccine. Adults older than 51 years of age who have normal immune system function should obtain the PPSV23 vaccine dose at least 1 year after the dose of PCV13 vaccine.  Pneumococcal polysaccharide (PPSV23) vaccine. When PCV13 is also indicated, PCV13 should be obtained first. All adults aged 91 years and older should be immunized. An adult younger than age 23 years who has certain medical conditions should be immunized. Any person who resides in a nursing home or long-term care facility should be immunized. An adult smoker should be immunized. People with an immunocompromised condition and certain other conditions should receive both PCV13 and PPSV23 vaccines. People with human immunodeficiency virus (HIV) infection should be immunized as soon as possible after diagnosis. Immunization during chemotherapy or radiation therapy should be avoided. Routine use of PPSV23 vaccine is not recommended for American Indians, [place] Natives, or people younger than 65 years unless there are medical conditions that require PPSV23 vaccine. When indicated, people who have unknown immunization and have no record of immunization should receive PPSV23 vaccine. One-time  revaccination 5 years after the first dose of PPSV23 is recommended for people aged 19-64 years who have chronic kidney failure, nephrotic syndrome, asplenia, or immunocompromised conditions. People who received 1-2 doses of PPSV23 before age 23 years should  receive another dose of PPSV23 vaccine at age 18 years or later if at least 5 years have passed since the previous dose. Doses of PPSV23 are not needed for people immunized with PPSV23 at or after age 7 years.  Meningococcal vaccine. Adults with asplenia or persistent complement component deficiencies should receive 2 doses of quadrivalent meningococcal conjugate (MenACWY-D) vaccine. The doses should be obtained at least 2 months apart. Microbiologists working with certain meningococcal bacteria, [employer] recruits, people at risk during an outbreak, and people who travel to or live in countries with a high rate of meningitis should be immunized. A first-year college student up through age 57 years who is living in a residence hall should receive a dose if he did not receive a dose on or after his 16th birthday. Adults who have certain high-risk conditions should receive one or more doses of vaccine.  Hepatitis A vaccine. Adults who wish to be protected from this disease, have chronic liver disease, work with hepatitis A-infected animals, work in hepatitis A research labs, or travel to or work in countries with a high rate of hepatitis A should be immunized. Adults who were previously unvaccinated and who anticipate close contact with an international adoptee during the first 60 days after arrival in the [place] from a country with a high rate of hepatitis A should be immunized.  Hepatitis B vaccine. Adults should be immunized if they wish to be protected from this disease, are under age 28 years and have diabetes, have chronic liver disease, have had more than one sex partner in the past 6 months, may be exposed to blood or other infectious body fluids, are household contacts or sex partners of hepatitis B positive people, are clients or workers in certain care facilities, or travel to or work in countries with a high rate of hepatitis B.  Haemophilus influenzae type b (Hib) vaccine. A previously  unvaccinated person with asplenia or sickle cell disease or having a scheduled splenectomy should receive 1 dose of Hib vaccine. Regardless of previous immunization, a recipient of a hematopoietic stem cell transplant should receive a 3-dose series 6-12 months after his successful transplant. Hib vaccine is not recommended for adults with HIV infection. Preventive Service / Frequency Ages 60 to 17  Blood pressure check.** / Every 3-5 years.  Lipid and cholesterol check.** / Every 5 years beginning at age 4.  Hepatitis C blood test.** / For any individual with known risks for hepatitis C.  Skin self-exam. / Monthly.  Influenza vaccine. / Every year.  Tetanus, diphtheria, and acellular pertussis (Tdap, Td) vaccine.** / Consult your health care provider. 1 dose of Td every 10 years.  Varicella vaccine.** / Consult your health care provider.  HPV vaccine. / 3 doses over 6 months, if 71 or younger.  Measles, mumps, rubella (MMR) vaccine.** / You need at least 1 dose of MMR if you were born in 1957 or later. You may also need a second dose.  Pneumococcal 13-valent conjugate (PCV13) vaccine.** / Consult your health care provider.  Pneumococcal polysaccharide (PPSV23) vaccine.** / 1 to 2 doses if you smoke cigarettes or if you have certain conditions.  Meningococcal vaccine.** / 1 dose if you are age 90 to 88 years and a Market researcher  living in a residence hall, or have one of several medical conditions. You may also need additional booster doses.  Hepatitis A vaccine.** / Consult your health care provider.  Hepatitis B vaccine.** / Consult your health care provider.  Haemophilus influenzae type b (Hib) vaccine.** / Consult your health care provider. Ages 6 to 53  Blood pressure check.** / Every year.  Lipid and cholesterol check.** / Every 5 years beginning at age 37.  Lung cancer screening. / Every year if you are aged 46-80 years and have a 30-pack-year history of  smoking and currently smoke or have quit within the past 15 years. Yearly screening is stopped once you have quit smoking for at least 15 years or develop a health problem that would prevent you from having lung cancer treatment.  Fecal occult blood test (FOBT) of stool. / Every year beginning at age 58 and continuing until age 35. You may not have to do this test if you get a colonoscopy every 10 years.  Flexible sigmoidoscopy** or colonoscopy.** / Every 5 years for a flexible sigmoidoscopy or every 10 years for a colonoscopy beginning at age 90 and continuing until age 86.  Hepatitis C blood test.** / For all people born from [birth year] through 1965 and any individual with known risks for hepatitis C.  Skin self-exam. / Monthly.  Influenza vaccine. / Every year.  Tetanus, diphtheria, and acellular pertussis (Tdap/Td) vaccine.** / Consult your health care provider. 1 dose of Td every 10 years.  Varicella vaccine.** / Consult your health care provider.  Zoster vaccine.** / 1 dose for adults aged 38 years or older.  Measles, mumps, rubella (MMR) vaccine.** / You need at least 1 dose of MMR if you were born in 1957 or later. You may also need a second dose.  Pneumococcal 13-valent conjugate (PCV13) vaccine.** / Consult your health care provider.  Pneumococcal polysaccharide (PPSV23) vaccine.** / 1 to 2 doses if you smoke cigarettes or if you have certain conditions.  Meningococcal vaccine.** / Consult your health care provider.  Hepatitis A vaccine.** / Consult your health care provider.  Hepatitis B vaccine.** / Consult your health care provider.  Haemophilus influenzae type b (Hib) vaccine.** / Consult your health care provider. Ages 75 and over  Blood pressure check.** / Every year.  Lipid and cholesterol check.**/ Every 5 years beginning at age 1.  Lung cancer screening. / Every year if you are aged 46-80 years and have a 30-pack-year history of smoking and currently smoke or have  quit within the past 15 years. Yearly screening is stopped once you have quit smoking for at least 15 years or develop a health problem that would prevent you from having lung cancer treatment.  Fecal occult blood test (FOBT) of stool. / Every year beginning at age 62 and continuing until age 75. You may not have to do this test if you get a colonoscopy every 10 years.  Flexible sigmoidoscopy** or colonoscopy.** / Every 5 years for a flexible sigmoidoscopy or every 10 years for a colonoscopy beginning at age 54 and continuing until age 26.  Hepatitis C blood test.** / For all people born from [birth year] through 1965 and any individual with known risks for hepatitis C.  Abdominal aortic aneurysm (AAA) screening.** / A one-time screening for ages 59 to 16 years who are current or former smokers.  Skin self-exam. / Monthly.  Influenza vaccine. / Every year.  Tetanus, diphtheria, and acellular pertussis (Tdap/Td) vaccine.** / 1 dose of Td every  10 years.  Varicella vaccine.** / Consult your health care provider.  Zoster vaccine.** / 1 dose for adults aged 43 years or older.  Pneumococcal 13-valent conjugate (PCV13) vaccine.** / 1 dose for all adults aged 73 years and older.  Pneumococcal polysaccharide (PPSV23) vaccine.** / 1 dose for all adults aged 9 years and older.  Meningococcal vaccine.** / Consult your health care provider.  Hepatitis A vaccine.** / Consult your health care provider.  Hepatitis B vaccine.** / Consult your health care provider.  Haemophilus influenzae type b (Hib) vaccine.** / Consult your health care provider. **Family history and personal history of risk and conditions may change your health care provider's recommendations.   This information is not intended to replace advice given to you by your health care provider. Make sure you discuss any questions you have with your health care provider.   Document Released: 04/26/2001 Document Revised: 03/21/2014 Document  Reviewed: 07/26/2010 Elsevier Interactive Patient Education [employer].

## 2016-07-19 LAB — BASIC METABOLIC PANEL
BUN/Creatinine Ratio: 12 (ref 9–20)
BUN: 12 mg/dL (ref 6–24)
CALCIUM: 9.6 mg/dL (ref 8.7–10.2)
CHLORIDE: 102 mmol/L (ref 96–106)
CO2: 24 mmol/L (ref 18–29)
Creatinine, Ser: 1.04 mg/dL (ref 0.76–1.27)
GFR calc non Af Amer: 83 mL/min/{1.73_m2} (ref >59)
GFR, EST AFRICAN AMERICAN: 96 mL/min/{1.73_m2} (ref >59)
Glucose: 108 mg/dL — ABNORMAL HIGH (ref 65–99)
POTASSIUM: 4.7 mmol/L (ref 3.5–5.2)
Sodium: 142 mmol/L (ref 134–144)

## 2016-07-19 LAB — LIPID PANEL
Chol/HDL Ratio: 4.5 ratio (ref 0.0–5.0)
Cholesterol, Total: 177 mg/dL (ref 100–199)
HDL: 39 mg/dL — ABNORMAL LOW (ref >39)
LDL Calculated: 106 mg/dL — ABNORMAL HIGH (ref 0–99)
Triglycerides: 160 mg/dL — ABNORMAL HIGH (ref 0–149)
VLDL Cholesterol Cal: 32 mg/dL (ref 5–40)

## 2016-07-19 LAB — VITAMIN D 25 HYDROXY (VIT D DEFICIENCY, FRACTURES): Vit D, 25-Hydroxy: 52.1 ng/mL (ref 30.0–100.0)

## 2016-07-19 LAB — AST: AST: 27 IU/L (ref 0–40)

## 2016-07-19 LAB — ALT: ALT: 26 IU/L (ref 0–44)

## 2016-08-18 ENCOUNTER — Encounter: Payer: Self-pay | Admitting: Family Medicine

## 2017-01-17 ENCOUNTER — Other Ambulatory Visit: Payer: Self-pay | Admitting: Family Medicine

## 2017-01-17 DIAGNOSIS — E786 Lipoprotein deficiency: Secondary | ICD-10-CM

## 2017-01-17 DIAGNOSIS — E781 Pure hyperglyceridemia: Secondary | ICD-10-CM

## 2017-01-17 DIAGNOSIS — E782 Mixed hyperlipidemia: Secondary | ICD-10-CM

## 2017-02-22 ENCOUNTER — Other Ambulatory Visit: Payer: Self-pay | Admitting: Family Medicine

## 2017-02-22 DIAGNOSIS — E782 Mixed hyperlipidemia: Secondary | ICD-10-CM

## 2017-02-22 DIAGNOSIS — E786 Lipoprotein deficiency: Secondary | ICD-10-CM

## 2017-02-22 DIAGNOSIS — E781 Pure hyperglyceridemia: Secondary | ICD-10-CM

## 2017-02-28 ENCOUNTER — Ambulatory Visit (INDEPENDENT_AMBULATORY_CARE_PROVIDER_SITE_OTHER): Payer: BLUE CROSS/BLUE SHIELD | Admitting: Family Medicine

## 2017-02-28 ENCOUNTER — Encounter: Payer: Self-pay | Admitting: Family Medicine

## 2017-02-28 VITALS — BP 130/86 | HR 66 | Ht 71.75 in | Wt 199.0 lb

## 2017-02-28 DIAGNOSIS — E782 Mixed hyperlipidemia: Secondary | ICD-10-CM

## 2017-02-28 DIAGNOSIS — E786 Lipoprotein deficiency: Secondary | ICD-10-CM | POA: Diagnosis not present

## 2017-02-28 DIAGNOSIS — R030 Elevated blood-pressure reading, without diagnosis of hypertension: Secondary | ICD-10-CM

## 2017-02-28 MED ORDER — ATORVASTATIN CALCIUM 40 MG PO TABS: 40.0000 mg | ORAL_TABLET | Freq: Every day | ORAL | 3 refills | Status: DC

## 2017-02-28 NOTE — Progress Notes (Signed)
Impression and Recommendations:    1. Hyperlipemia, mixed   2. Low HDL (under 40)   3. Elevated blood pressure reading without diagnosis of hypertension    [facility] in [place] who we sent you to for colonoscopy.  Please call them back as they tried calling you several times in May.  See if you need another referral will be happy to place one if needed.  Continue your cholesterol medicine and fish oil of 3-4 g/day over-the-counter.  We will hold off on rechecking labs at this time and recheck it in 5 months to 6 months we do yearly physical early May.  -Please make sure you are checking your blood pressure at home and if it remains above 135/85 or more on a regular basis please follow-up with me as we may need to start medications.  Of course diet exercise etc. will improve this blood pressure.  1.  -Refill Lipitor prescription. Advised to continue medications as prescribed.   2.  -Discussed and advised the patient to increase exercise regimen to aid with increasing HDL levels.   3.  Advised the patient to check blood pressure at home. Patient advised and recommended to increase exercise and better nutrition to aid with elevated blood pressure.    -Discussed and advised the patient to follow up with [facility] to schedule a colonoscopy.   -Patient to follow up in 6 months for a CPE along with fasting labs.   Meds ordered this encounter  Medications  . atorvastatin (LIPITOR) 40 MG tablet    Sig: Take 1 tablet (40 mg total) by mouth at bedtime.    Dispense:  90 tablet    Refill:  3    Gross side effects, risk and benefits, and alternatives of medications and treatment plan in general discussed with patient.  Patient is aware that all medications have potential side effects and we are unable to predict every side effect or drug-drug interaction that may occur.   Patient will call with any questions prior to using medication if they have concerns.   Expresses verbal understanding and consents to current therapy and treatment regimen.  No barriers to understanding were identified.  Red flag symptoms and signs discussed in detail.  Patient expressed understanding regarding what to do in case of emergency\urgent symptoms  Please see AVS handed out to patient at the end of our visit for further patient instructions/ counseling done pertaining to today's office visit.   Return in about 6 months (around 08/29/2017) for CPE/ yrly physical, come fasting.    Note: This note was prepared with assistance of Dragon voice recognition software. Occasional wrong-word or sound-a-like substitutions may have occurred due to the inherent limitations of voice recognition software.   This document serves as a record of services personally performed by Mellody Dance, MD. It was created on her behalf by Steva Colder, a trained medical scribe. The creation of this record is based on the scribe's personal observations and the provider's statements to them.   I have reviewed the above medical documentation for accuracy and completeness and I concur.  Mellody Dance 03/27/17 9:20 AM   --------------------------------------------------------------------------------------------------------------------------------------------------------------------------------------------------------------------------------------------    Subjective:     HPI: Edward Pierce is a 51 y.o. male who presents to [place] at [facility] today for issues as discussed below. He is doing well overall with no acute symptoms.   -He reports that he stays active with completing activities, but he doesn't exercise  as he should.   -He has to follow up with dermatologist and schedule a colonoscopy.   -He hasn't been completing at home readings for his blood pressure recently.     Wt Readings from Last 3 Encounters:  02/28/17 199 lb (90.3 kg)  07/18/16 199 lb 8 oz  (90.5 kg)  03/02/16 201 lb 11.2 oz (91.5 kg)   BP Readings from Last 3 Encounters:  02/28/17 130/86  07/18/16 129/80  03/02/16 128/83   Pulse Readings from Last 3 Encounters:  02/28/17 66  07/18/16 70  03/02/16 69   BMI Readings from Last 3 Encounters:  02/28/17 27.18 kg/m  07/18/16 27.25 kg/m  03/02/16 27.55 kg/m     Patient Care Team    Relationship Specialty Notifications Start End  Mellody Dance, DO PCP - General Family Medicine  12/29/15   Leandrew Koyanagi, MD Attending Physician Orthopedic Surgery  12/29/15      Patient Active Problem List   Diagnosis Date Noted  . Colon cancer screening 07/18/2016  . Elevated blood pressure reading without diagnosis of hypertension 03/02/2016  . Vitamin D deficiency 01/23/2016  . Excessive drinking of alcohol at times 01/23/2016  . Hypertriglyceridemia, familial 01/19/2016  . Hyperlipemia, mixed 01/19/2016  . Low HDL (under 40) 01/19/2016  . Overweight (BMI 25.0-29.9) 12/29/2015  . Periodic heart flutter 12/29/2015    Past Medical history, Surgical history, Family history, Social history, Allergies and Medications have been entered into the medical record, reviewed and changed as needed.    Current Meds  Medication Sig  . atorvastatin (LIPITOR) 40 MG tablet Take 1 tablet (40 mg total) by mouth at bedtime.  . cholecalciferol (VITAMIN D) 1000 units tablet Take 5,000 Units by mouth daily.  . Omega-3 Fatty Acids (FISH OIL) 500 MG CAPS Take 3 capsules by mouth daily.  . [DISCONTINUED] atorvastatin (LIPITOR) 40 MG tablet Take 1 tablet by mouth daily.    Allergies:  No Known Allergies   Review of Systems:  A fourteen system review of systems was performed and found to be positive as per HPI.   Objective:   Blood pressure 130/86, pulse 66, height 5' 11.75" (1.822 m), weight 199 lb (90.3 kg), SpO2 98 %. Body mass index is 27.18 kg/m. General:  Well Developed, well nourished, appropriate for stated age.  Neuro:  Alert  and oriented,  extra-ocular muscles intact  HEENT:  Normocephalic, atraumatic, neck supple, no carotid bruits appreciated  Skin:  no gross rash, warm, pink. Cardiac:  RRR, S1 S2 Respiratory:  ECTA B/L and A/P, Not using accessory muscles, speaking in full sentences- unlabored. Vascular:  Ext warm, no cyanosis apprec.; cap RF less 2 sec. Psych:  No HI/SI, judgement and insight good, Euthymic mood. Full Affect.

## 2017-02-28 NOTE — Patient Instructions (Addendum)
[facility] in [place] who we sent you to for colonoscopy.  Please call them back as they tried calling you several times in May.  See if you need another referral will be happy to place one if needed.  Continue your cholesterol medicine and fish oil of 3-4 g/day over-the-counter.  We will hold off on rechecking labs at this time and recheck it in 5 months to 6 months we do yearly physical early May.  -Please make sure you are checking your blood pressure at home and if it remains above 135/85 or more on a regular basis please follow-up with me as we may need to start medications.  Of course diet exercise etc. will improve this blood pressure.

## 2017-08-29 ENCOUNTER — Encounter: Payer: Self-pay | Admitting: Family Medicine

## 2017-08-29 ENCOUNTER — Ambulatory Visit (INDEPENDENT_AMBULATORY_CARE_PROVIDER_SITE_OTHER): Payer: BLUE CROSS/BLUE SHIELD | Admitting: Family Medicine

## 2017-08-29 VITALS — BP 132/86 | HR 77 | Ht 72.0 in | Wt 205.0 lb

## 2017-08-29 DIAGNOSIS — R030 Elevated blood-pressure reading, without diagnosis of hypertension: Secondary | ICD-10-CM

## 2017-08-29 DIAGNOSIS — Z0000 Encounter for general adult medical examination without abnormal findings: Secondary | ICD-10-CM

## 2017-08-29 DIAGNOSIS — Z719 Counseling, unspecified: Secondary | ICD-10-CM

## 2017-08-29 DIAGNOSIS — E559 Vitamin D deficiency, unspecified: Secondary | ICD-10-CM | POA: Diagnosis not present

## 2017-08-29 DIAGNOSIS — E663 Overweight: Secondary | ICD-10-CM

## 2017-08-29 DIAGNOSIS — E782 Mixed hyperlipidemia: Secondary | ICD-10-CM | POA: Diagnosis not present

## 2017-08-29 DIAGNOSIS — Z1211 Encounter for screening for malignant neoplasm of colon: Secondary | ICD-10-CM

## 2017-08-29 NOTE — Progress Notes (Signed)
Male physical  Impression and Recommendations:    1. Encounter for wellness examination   2. Health education/counseling   3. Overweight (BMI 25.0-29.9)   4. Elevated blood pressure reading without diagnosis of hypertension   5. Hyperlipemia, mixed   6. Vitamin D deficiency     1) Anticipatory Guidance: Discussed importance of wearing a seatbelt while driving, not texting while driving;   sunscreen when outside along with skin surveillance; eating a balanced and modest diet; physical activity at least 25 minutes per day or 150 min/ week moderate to intense activity.  2) Immunizations / Screenings / Labs:  All immunizations are up-to-date per recommendations or will be updated today. Patient is due for dental and vision screens which pt will schedule independently. Will obtain CBC, CMP, HgA1c, Lipid panel, TSH and vit D when fasting, if not already done recently.   3) Weight:  BMI meaning discussed with patient.  Discussed goal of losing 5-10% of current body weight which would improve overall feelings of well being and improve objective health data. Improve nutrient density of diet through increasing intake of fruits and vegetables and decreasing saturated fats, white flour products and refined sugars.   -referral given to dermatology. Pt prefers Dr. Amy Martinique, at [facility].  -heme negative in office today.  -referral given for colonoscopy. CMA to order colonoscopy.  -Check BP at home and keep a log. Bring this into next OV.    Orders Placed This Encounter  Procedures  . CBC with Differential/Platelet  . Comprehensive metabolic panel  . TSH  . VITAMIN D 25 Hydroxy (Vit-D Deficiency, Fractures)  . Hemoglobin A1c  . Lipid panel  . T4, free    No orders of the defined types were placed in this encounter.    Gross side effects, risk and benefits, and alternatives of medications discussed with patient.  Patient is aware that all medications  have potential side effects and we are unable to predict every side effect or drug-drug interaction that may occur.  Expresses verbal understanding and consents to current therapy plan and treatment regimen.  Please see AVS handed out to patient at the end of our visit for further patient instructions/ counseling done pertaining to today's office visit.  Follow-up preventative CPE in 1 year. Follow-up office visit pending lab work.  F/up sooner for chronic care management and/or prn  This document serves as a record of services personally performed by Mellody Dance, DO. It was created on her behalf by Mayer Masker, a trained medical scribe. The creation of this record is based on the scribe's personal observations and the provider's statements to them.   I have reviewed the above medical documentation for accuracy and completeness and I concur.  Mellody Dance 08/29/17 9:21 AM   Subjective:    CC: CPE  HPI: Edward Pierce is a 52 y.o. male who presents to [place] at [facility] today for a yearly health maintenance exam.    Health Maintenance Summary Reviewed and updated, unless pt declines services.  Colonoscopy:     not UTD.  Tobacco History Reviewed:   Y- smoked for 13 years but quit 20+ years.  CT scan for screening lung CA:   NA. Abdominal Ultrasound:  NA Alcohol:    No concerns, no excessive use Exercise Habits:   Not meeting guidelines.  STD concerns:   none Drug Use:   None Birth control method:   n/a Testicular/penile concerns:  No problems.   Vision UTD- last done march/april 2019- goes regularly.  Dental He has a Pharmacist, community, Arita Miss, DDS; he goes q 6 months.   Skin Pt has FMHx of melanoma in his father.   Denies nausea, vomiting, diarrhea, CP, exercise intolerance, etc.   Immunization History  Administered Date(s) Administered  . Tdap 08/20/2009    Health Maintenance  Topic Date Due  . INFLUENZA VACCINE  10/31/2017 (Originally  10/12/2017)  . COLONOSCOPY  02/28/2018 (Originally 09/19/2015)  . HIV Screening  02/28/2018 (Originally 09/18/1980)  . TETANUS/TDAP  08/21/2019      Wt Readings from Last 3 Encounters:  08/29/17 205 lb (93 kg)  02/28/17 199 lb (90.3 kg)  07/18/16 199 lb 8 oz (90.5 kg)   BP Readings from Last 3 Encounters:  08/29/17 (!) 150/99  02/28/17 130/86  07/18/16 129/80   Pulse Readings from Last 3 Encounters:  08/29/17 77  02/28/17 66  07/18/16 70    Patient Active Problem List   Diagnosis Date Noted  . Colon cancer screening 07/18/2016  . Elevated blood pressure reading without diagnosis of hypertension 03/02/2016  . Vitamin D deficiency 01/23/2016  . Excessive drinking of alcohol at times 01/23/2016  . Hypertriglyceridemia, familial 01/19/2016  . Hyperlipemia, mixed 01/19/2016  . Low HDL (under 40) 01/19/2016  . Overweight (BMI 25.0-29.9) 12/29/2015  . Periodic heart flutter 12/29/2015    Past Medical History:  Diagnosis Date  . Hyperlipidemia     Past Surgical History:  Procedure Laterality Date  . HERNIA REPAIR      Family History  Problem Relation Age of Onset  . Cancer Mother        breast  . Diabetes Mother   . Hypertension Mother   . Heart disease Mother   . Hyperlipidemia Father   . COPD Father   . Heart attack Paternal Grandfather     Social History   Substance and Sexual Activity  Drug Use No  ,  Social History   Substance and Sexual Activity  Alcohol Use Yes  . Alcohol/week: 1.8 oz  . Types: 1 Glasses of wine, 2 Cans of beer per week  ,  Social History   Tobacco Use  Smoking Status Former Smoker  . Packs/day: 0.25  . Years: 13.00  . Pack years: 3.25  . Types: Cigarettes  . Last attempt to quit: 02/12/1996  . Years since quitting: 21.5  Smokeless Tobacco Never Used  ,  Social History   Substance and Sexual Activity  Sexual Activity Yes  . Birth control/protection: None    Patient's Medications  New Prescriptions   No medications on  file  Previous Medications   ATORVASTATIN (LIPITOR) 40 MG TABLET    Take 1 tablet (40 mg total) by mouth at bedtime.   CHOLECALCIFEROL (VITAMIN D) 1000 UNITS TABLET    Take 5,000 Units by mouth daily.   OMEGA-3 FATTY ACIDS (FISH OIL) 500 MG CAPS    Take 3 capsules by mouth daily.  Modified Medications   No medications on file  Discontinued Medications   No medications on file    Patient has no known allergies.  Review of Systems: General:   Denies fever, chills, unexplained weight loss.  Optho/Auditory:   Denies visual changes, blurred vision/LOV Respiratory:   Denies SOB, DOE more than baseline levels.  Cardiovascular:   Denies chest pain, palpitations, new onset peripheral edema  Gastrointestinal:   Denies nausea, vomiting, diarrhea.  Genitourinary: Denies dysuria, freq/ urgency, flank pain or discharge  from genitals.  Endocrine:     Denies hot or cold intolerance, polyuria, polydipsia. Musculoskeletal:   Denies unexplained myalgias, joint swelling, unexplained arthralgias, gait problems.  Skin:  Denies rash, suspicious lesions Neurological:     Denies dizziness, unexplained weakness, numbness  Psychiatric/Behavioral:   Denies mood changes, suicidal or homicidal ideations, hallucinations    Objective:     Blood pressure (!) 150/99, pulse 77, height 6' (1.829 m), weight 205 lb (93 kg), SpO2 98 %. Body mass index is 27.8 kg/m. General Appearance:    Alert, cooperative, no distress, appears stated age  Head:    Normocephalic, without obvious abnormality, atraumatic  Eyes:    PERRL, conjunctiva/corneas clear, EOM's intact, fundi    benign, both eyes  Ears:    Normal TM's and external ear canals, both ears  Nose:   Nares normal, septum midline, mucosa normal, no drainage    or sinus tenderness  Throat:   Lips w/o lesion, mucosa moist, and tongue normal; teeth and   gums normal  Neck:   Supple, symmetrical, trachea midline, no adenopathy;    thyroid:  no  enlargement/tenderness/nodules; no carotid   bruit or JVD  Back:     Symmetric, no curvature, ROM normal, no CVA tenderness  Lungs:     Clear to auscultation bilaterally, respirations unlabored, no       Wh/ R/ R  Chest Wall:    No tenderness or gross deformity; normal excursion   Heart:    Regular rate and rhythm, S1 and S2 normal, no murmur, rub   or gallop  Abdomen:     Soft, non-tender, bowel sounds active all four quadrants, NO   G/R/R, no masses, no organomegaly  Genitalia:    Ext genitalia: without lesion, no penile rash or discharge, no hernias appreciated   Rectal:    Normal tone, prostate WNL's and equal b/l, no tenderness; guaiac negative stool  Extremities:   Extremities normal, atraumatic, no cyanosis or gross edema  Pulses:   2+ and symmetric all extremities  Skin:   Warm, dry, Skin color, texture, turgor normal, no obvious rashes or lesions  M-Sk:   Ambulates * 4 w/o difficulty, no gross deformities, tone WNL  Neurologic:   CNII-XII intact, normal strength, sensation and reflexes    Throughout Psych:  No HI/SI, judgement and insight good, Euthymic mood. Full Affect.

## 2017-08-29 NOTE — Addendum Note (Signed)
Addended by: Lanier Prude D on: 08/29/2017 09:38 AM   Modules accepted: Orders

## 2017-08-29 NOTE — Patient Instructions (Addendum)
- cma to order routine screening colonoscopy    How to Take Your Blood Pressure You can take your blood pressure at home with a machine. You may need to check your blood pressure at home:  To check if you have high blood pressure (hypertension).  To check your blood pressure over time.  To make sure your blood pressure medicine is working.  Supplies needed: You will need a blood pressure machine, or monitor. You can buy one at a drugstore or online. When choosing one:  Choose one with an arm cuff.  Choose one that wraps around your upper arm. Only one finger should fit between your arm and the cuff.  Do not choose one that measures your blood pressure from your wrist or finger.  Your doctor can suggest a monitor. How to prepare Avoid these things for 30 minutes before checking your blood pressure:  Drinking caffeine.  Drinking alcohol.  Eating.  Smoking.  Exercising.  Five minutes before checking your blood pressure:  Pee.  Sit in a dining chair. Avoid sitting in a soft couch or armchair.  Be quiet. Do not talk.  How to take your blood pressure Follow the instructions that came with your machine. If you have a digital blood pressure monitor, these may be the instructions: 1. Sit up straight. 2. Place your feet on the floor. Do not cross your ankles or legs. 3. Rest your left arm at the level of your heart. You may rest it on a table, desk, or chair. 4. Pull up your shirt sleeve. 5. Wrap the blood pressure cuff around the upper part of your left arm. The cuff should be 1 inch (2.5 cm) above your elbow. It is best to wrap the cuff around bare skin. 6. Fit the cuff snugly around your arm. You should be able to place only one finger between the cuff and your arm. 7. Put the cord inside the groove of your elbow. 8. Press the power button. 9. Sit quietly while the cuff fills with air and loses air. 10. Write down the numbers on the screen. 11. Wait 2-3 minutes and  then repeat steps 1-10.  What do the numbers mean? Two numbers make up your blood pressure. The first number is called systolic pressure. The second is called diastolic pressure. An example of a blood pressure reading is "120 over 80" (or 120/80). If you are an adult and do not have a medical condition, use this guide to find out if your blood pressure is normal: Normal  First number: below 120.  Second number: below 80. Elevated  First number: 120-129.  Second number: below 80. Hypertension stage 1  First number: 130-139.  Second number: 80-89. Hypertension stage 2  First number: 140 or above.  Second number: 51 or above. Your blood pressure is above normal even if only the top or bottom number is above normal. Follow these instructions at home:  Check your blood pressure as often as your doctor tells you to.  Take your monitor to your next doctor's appointment. Your doctor will: ? Make sure you are using it correctly. ? Make sure it is working right.  Make sure you understand what your blood pressure numbers should be.  Tell your doctor if your medicines are causing side effects. Contact a doctor if:  Your blood pressure keeps being high. Get help right away if:  Your first blood pressure number is higher than 180.  Your second blood pressure number is higher than  120. This information is not intended to replace advice given to you by your health care provider. Make sure you discuss any questions you have with your health care provider. Document Released: 02/11/2008 Document Revised: 01/27/2016 Document Reviewed: 08/07/2015 Elsevier Interactive Patient Education  2018 [employer].   Hypertension Hypertension, commonly called high blood pressure, is when the force of blood pumping through the arteries is too strong. The arteries are the blood vessels that carry blood from the heart throughout the body. Hypertension forces the heart to work harder to pump blood  and may cause arteries to become narrow or stiff. Having untreated or uncontrolled hypertension can cause heart attacks, strokes, kidney disease, and other problems. A blood pressure reading consists of a higher number over a lower number. Ideally, your blood pressure should be below 120/80. The first ("top") number is called the systolic pressure. It is a measure of the pressure in your arteries as your heart beats. The second ("bottom") number is called the diastolic pressure. It is a measure of the pressure in your arteries as the heart relaxes. What are the causes? The cause of this condition is not known. What increases the risk? Some risk factors for high blood pressure are under your control. Others are not. Factors you can change  Smoking.  Having type 2 diabetes mellitus, high cholesterol, or both.  Not getting enough exercise or physical activity.  Being overweight.  Having too much fat, sugar, calories, or salt (sodium) in your diet.  Drinking too much alcohol. Factors that are difficult or impossible to change  Having chronic kidney disease.  Having a family history of high blood pressure.  Age. Risk increases with age.  Race. You may be at higher risk if you are African-American.  Gender. Men are at higher risk than women before age 13. After age 36, women are at higher risk than men.  Having obstructive sleep apnea.  Stress. What are the signs or symptoms? Extremely high blood pressure (hypertensive crisis) may cause:  Headache.  Anxiety.  Shortness of breath.  Nosebleed.  Nausea and vomiting.  Severe chest pain.  Jerky movements you cannot control (seizures).  How is this diagnosed? This condition is diagnosed by measuring your blood pressure while you are seated, with your arm resting on a surface. The cuff of the blood pressure monitor will be placed directly against the skin of your upper arm at the level of your heart. It should be measured at  least twice using the same arm. Certain conditions can cause a difference in blood pressure between your right and left arms. Certain factors can cause blood pressure readings to be lower or higher than normal (elevated) for a short period of time:  When your blood pressure is higher when you are in a health care provider's office than when you are at home, this is called white coat hypertension. Most people with this condition do not need medicines.  When your blood pressure is higher at home than when you are in a health care provider's office, this is called masked hypertension. Most people with this condition may need medicines to control blood pressure.  If you have a high blood pressure reading during one visit or you have normal blood pressure with other risk factors:  You may be asked to return on a different day to have your blood pressure checked again.  You may be asked to monitor your blood pressure at home for 1 week or longer.  If you are  diagnosed with hypertension, you may have other blood or imaging tests to help your health care provider understand your overall risk for other conditions. How is this treated? This condition is treated by making healthy lifestyle changes, such as eating healthy foods, exercising more, and reducing your alcohol intake. Your health care provider may prescribe medicine if lifestyle changes are not enough to get your blood pressure under control, and if:  Your systolic blood pressure is above 130.  Your diastolic blood pressure is above 80.  Your personal target blood pressure may vary depending on your medical conditions, your age, and other factors. Follow these instructions at home: Eating and drinking  Eat a diet that is high in fiber and potassium, and low in sodium, added sugar, and fat. An example eating plan is called the DASH (Dietary Approaches to Stop Hypertension) diet. To eat this way: ? Eat plenty of fresh fruits and vegetables.  Try to fill half of your plate at each meal with fruits and vegetables. ? Eat whole grains, such as whole wheat pasta, brown rice, or whole grain bread. Fill about one quarter of your plate with whole grains. ? Eat or drink low-fat dairy products, such as skim milk or low-fat yogurt. ? Avoid fatty cuts of meat, processed or cured meats, and poultry with skin. Fill about one quarter of your plate with lean proteins, such as fish, chicken without skin, beans, eggs, and tofu. ? Avoid premade and processed foods. These tend to be higher in sodium, added sugar, and fat.  Reduce your daily sodium intake. Most people with hypertension should eat less than 1,500 mg of sodium a day.  Limit alcohol intake to no more than 1 drink a day for nonpregnant women and 2 drinks a day for men. One drink equals 12 oz of beer, 5 oz of wine, or 1 oz of hard liquor. Lifestyle  Work with your health care provider to maintain a healthy body weight or to lose weight. Ask what an ideal weight is for you.  Get at least 30 minutes of exercise that causes your heart to beat faster (aerobic exercise) most days of the week. Activities may include walking, swimming, or biking.  Include exercise to strengthen your muscles (resistance exercise), such as pilates or lifting weights, as part of your weekly exercise routine. Try to do these types of exercises for 30 minutes at least 3 days a week.  Do not use any products that contain nicotine or tobacco, such as cigarettes and e-cigarettes. If you need help quitting, ask your health care provider.  Monitor your blood pressure at home as told by your health care provider.  Keep all follow-up visits as told by your health care provider. This is important. Medicines  Take over-the-counter and prescription medicines only as told by your health care provider. Follow directions carefully. Blood pressure medicines must be taken as prescribed.  Do not skip doses of blood pressure  medicine. Doing this puts you at risk for problems and can make the medicine less effective.  Ask your health care provider about side effects or reactions to medicines that you should watch for. Contact a health care provider if:  You think you are having a reaction to a medicine you are taking.  You have headaches that keep coming back (recurring).  You feel dizzy.  You have swelling in your ankles.  You have trouble with your vision. Get help right away if:  You develop a severe headache or confusion.  You have unusual weakness or numbness.  You feel faint.  You have severe pain in your chest or abdomen.  You vomit repeatedly.  You have trouble breathing. Summary  Hypertension is when the force of blood pumping through your arteries is too strong. If this condition is not controlled, it may put you at risk for serious complications.  Your personal target blood pressure may vary depending on your medical conditions, your age, and other factors. For most people, a normal blood pressure is less than 120/80.  Hypertension is treated with lifestyle changes, medicines, or a combination of both. Lifestyle changes include weight loss, eating a healthy, low-sodium diet, exercising more, and limiting alcohol. This information is not intended to replace advice given to you by your health care provider. Make sure you discuss any questions you have with your health care provider. Document Released: 02/28/2005 Document Revised: 01/27/2016 Document Reviewed: 01/27/2016 Elsevier Interactive Patient Education  2018 [place] for Adults, Male A healthy lifestyle and preventive care can promote health and wellness. Preventive health guidelines for men include the following key practices:  A routine yearly physical is a good way to check with your health care provider about your health and preventative screening. It is a chance to share any concerns and updates on  your health and to receive a thorough exam.  Visit your dentist for a routine exam and preventative care every 6 months. Brush your teeth twice a day and floss once a day. Good oral hygiene prevents tooth decay and gum disease.  The frequency of eye exams is based on your age, health, family medical history, use of contact lenses, and other factors. Follow your health care provider's recommendations for frequency of eye exams.  Eat a healthy diet. Foods such as vegetables, fruits, whole grains, low-fat dairy products, and lean protein foods contain the nutrients you need without too many calories. Decrease your intake of foods high in solid fats, added sugars, and salt. Eat the right amount of calories for you.Get information about a proper diet from your health care provider, if necessary.  Regular physical exercise is one of the most important things you can do for your health. Most adults should get at least 150 minutes of moderate-intensity exercise (any activity that increases your heart rate and causes you to sweat) each week. In addition, most adults need muscle-strengthening exercises on 2 or more days a week.  Maintain a healthy weight. The body mass index (BMI) is a screening tool to identify possible weight problems. It provides an estimate of body fat based on height and weight. Your health care provider can find your BMI and can help you achieve or maintain a healthy weight.For adults 20 years and older:  A BMI below 18.5 is considered underweight.  A BMI of 18.5 to 24.9 is normal.  A BMI of 25 to 29.9 is considered overweight.  A BMI of 30 and above is considered obese.  Maintain normal blood lipids and cholesterol levels by exercising and minimizing your intake of saturated fat. Eat a balanced diet with plenty of fruit and vegetables. Blood tests for lipids and cholesterol should begin at age 16 and be repeated every 5 years. If your lipid or cholesterol levels are high, you are  over 50, or you are at high risk for heart disease, you may need your cholesterol levels checked more frequently.Ongoing high lipid and cholesterol levels should be treated with medicines if diet and exercise are  not working.  If you smoke, find out from your health care provider how to quit. If you do not use tobacco, do not start.  Lung cancer screening is recommended for adults aged 58-80 years who are at high risk for developing lung cancer because of a history of smoking. A yearly low-dose CT scan of the lungs is recommended for people who have at least a 30-pack-year history of smoking and are a current smoker or have quit within the past 15 years. A pack year of smoking is smoking an average of 1 pack of cigarettes a day for 1 year (for example: 1 pack a day for 30 years or 2 packs a day for 15 years). Yearly screening should continue until the smoker has stopped smoking for at least 15 years. Yearly screening should be stopped for people who develop a health problem that would prevent them from having lung cancer treatment.  If you choose to drink alcohol, do not have more than 2 drinks per day. One drink is considered to be 12 ounces (355 mL) of beer, 5 ounces (148 mL) of wine, or 1.5 ounces (44 mL) of liquor.  Avoid use of street drugs. Do not share needles with anyone. Ask for help if you need support or instructions about stopping the use of drugs.  High blood pressure causes heart disease and increases the risk of stroke. Your blood pressure should be checked at least every 1-2 years. Ongoing high blood pressure should be treated with medicines, if weight loss and exercise are not effective.  If you are 46-22 years old, ask your health care provider if you should take aspirin to prevent heart disease.  Diabetes screening is done by taking a blood sample to check your blood glucose level after you have not eaten for a certain period of time (fasting). If you are not overweight and you do  not have risk factors for diabetes, you should be screened once every 3 years starting at age 20. If you are overweight or obese and you are 79-65 years of age, you should be screened for diabetes every year as part of your cardiovascular risk assessment.  Colorectal cancer can be detected and often prevented. Most routine colorectal cancer screening begins at the age of 34 and continues through age 30. However, your health care provider may recommend screening at an earlier age if you have risk factors for colon cancer. On a yearly basis, your health care provider may provide home test kits to check for hidden blood in the stool. Use of a small camera at the end of a tube to directly examine the colon (sigmoidoscopy or colonoscopy) can detect the earliest forms of colorectal cancer. Talk to your health care provider about this at age 29, when routine screening begins. Direct exam of the colon should be repeated every 5-10 years through age 60, unless early forms of precancerous polyps or small growths are found.  People who are at an increased risk for hepatitis B should be screened for this virus. You are considered at high risk for hepatitis B if:  You were born in a country where hepatitis B occurs often. Talk with your health care provider about which countries are considered high risk.  Your parents were born in a high-risk country and you have not received a shot to protect against hepatitis B (hepatitis B vaccine).  You have HIV or AIDS.  You use needles to inject street drugs.  You live with, or have sex  with, someone who has hepatitis B.  You are a man who has sex with other men (MSM).  You get hemodialysis treatment.  You take certain medicines for conditions such as cancer, organ transplantation, and autoimmune conditions.  Hepatitis C blood testing is recommended for all people born from [birth year] through 1965 and any individual with known risks for hepatitis C.  Practice safe sex.  Use condoms and avoid high-risk sexual practices to reduce the spread of sexually transmitted infections (STIs). STIs include gonorrhea, chlamydia, syphilis, trichomonas, herpes, HPV, and human immunodeficiency virus (HIV). Herpes, HIV, and HPV are viral illnesses that have no cure. They can result in disability, cancer, and death.  If you are a man who has sex with other men, you should be screened at least once per year for:  HIV.  Urethral, rectal, and pharyngeal infection of gonorrhea, chlamydia, or both.  If you are at risk of being infected with HIV, it is recommended that you take a prescription medicine daily to prevent HIV infection. This is called preexposure prophylaxis (PrEP). You are considered at risk if:  You are a man who has sex with other men (MSM) and have other risk factors.  You are a heterosexual man, are sexually active, and are at increased risk for HIV infection.  You take drugs by injection.  You are sexually active with a partner who has HIV.  Talk with your health care provider about whether you are at high risk of being infected with HIV. If you choose to begin PrEP, you should first be tested for HIV. You should then be tested every 3 months for as long as you are taking PrEP.  A one-time screening for abdominal aortic aneurysm (AAA) and surgical repair of large AAAs by ultrasound are recommended for men ages 48 to 75 years who are current or former smokers.  Healthy men should no longer receive prostate-specific antigen (PSA) blood tests as part of routine cancer screening. Talk with your health care provider about prostate cancer screening.  Testicular cancer screening is not recommended for adult males who have no symptoms. Screening includes self-exam, a health care provider exam, and other screening tests. Consult with your health care provider about any symptoms you have or any concerns you have about testicular cancer.  Use sunscreen. Apply sunscreen  liberally and repeatedly throughout the day. You should seek shade when your shadow is shorter than you. Protect yourself by wearing long sleeves, pants, a wide-brimmed hat, and sunglasses year round, whenever you are outdoors.  Once a month, do a whole-body skin exam, using a mirror to look at the skin on your back. Tell your health care provider about new moles, moles that have irregular borders, moles that are larger than a pencil eraser, or moles that have changed in shape or color.  Stay current with required vaccines (immunizations).  Influenza vaccine. All adults should be immunized every year.  Tetanus, diphtheria, and acellular pertussis (Td, Tdap) vaccine. An adult who has not previously received Tdap or who does not know his vaccine status should receive 1 dose of Tdap. This initial dose should be followed by tetanus and diphtheria toxoids (Td) booster doses every 10 years. Adults with an unknown or incomplete history of completing a 3-dose immunization series with Td-containing vaccines should begin or complete a primary immunization series including a Tdap dose. Adults should receive a Td booster every 10 years.  Varicella vaccine. An adult without evidence of immunity to varicella should receive 2 doses or  a second dose if he has previously received 1 dose.  Human papillomavirus (HPV) vaccine. Males aged 11-21 years who have not received the vaccine previously should receive the 3-dose series. Males aged 22-26 years may be immunized. Immunization is recommended through the age of 13 years for any male who has sex with males and did not get any or all doses earlier. Immunization is recommended for any person with an immunocompromised condition through the age of 64 years if he did not get any or all doses earlier. During the 3-dose series, the second dose should be obtained 4-8 weeks after the first dose. The third dose should be obtained 24 weeks after the first dose and 16 weeks after the  second dose.  Zoster vaccine. One dose is recommended for adults aged 38 years or older unless certain conditions are present.  Measles, mumps, and rubella (MMR) vaccine. Adults born before [birth year] generally are considered immune to measles and mumps. Adults born in [birth year] or later should have 1 or more doses of MMR vaccine unless there is a contraindication to the vaccine or there is laboratory evidence of immunity to each of the three diseases. A routine second dose of MMR vaccine should be obtained at least 28 days after the first dose for students attending postsecondary schools, health care workers, or international travelers. People who received inactivated measles vaccine or an unknown type of measles vaccine during 1963-1967 should receive 2 doses of MMR vaccine. People who received inactivated mumps vaccine or an unknown type of mumps vaccine before 1979 and are at high risk for mumps infection should consider immunization with 2 doses of MMR vaccine. Unvaccinated health care workers born before [birth year] who lack laboratory evidence of measles, mumps, or rubella immunity or laboratory confirmation of disease should consider measles and mumps immunization with 2 doses of MMR vaccine or rubella immunization with 1 dose of MMR vaccine.  Pneumococcal 13-valent conjugate (PCV13) vaccine. When indicated, a person who is uncertain of his immunization history and has no record of immunization should receive the PCV13 vaccine. All adults 70 years of age and older should receive this vaccine. An adult aged 80 years or older who has certain medical conditions and has not been previously immunized should receive 1 dose of PCV13 vaccine. This PCV13 should be followed with a dose of pneumococcal polysaccharide (PPSV23) vaccine. Adults who are at high risk for pneumococcal disease should obtain the PPSV23 vaccine at least 8 weeks after the dose of PCV13 vaccine. Adults older than 52 years of age who have normal immune system  function should obtain the PPSV23 vaccine dose at least 1 year after the dose of PCV13 vaccine.  Pneumococcal polysaccharide (PPSV23) vaccine. When PCV13 is also indicated, PCV13 should be obtained first. All adults aged 27 years and older should be immunized. An adult younger than age 53 years who has certain medical conditions should be immunized. Any person who resides in a nursing home or long-term care facility should be immunized. An adult smoker should be immunized. People with an immunocompromised condition and certain other conditions should receive both PCV13 and PPSV23 vaccines. People with human immunodeficiency virus (HIV) infection should be immunized as soon as possible after diagnosis. Immunization during chemotherapy or radiation therapy should be avoided. Routine use of PPSV23 vaccine is not recommended for American Indians, [place]atives, or people younger than 65 years unless there are medical conditions that require PPSV23 vaccine. When indicated, people who have unknown immunization and have no record of  immunization should receive PPSV23 vaccine. One-time revaccination 5 years after the first dose of PPSV23 is recommended for people aged 19-64 years who have chronic kidney failure, nephrotic syndrome, asplenia, or immunocompromised conditions. People who received 1-2 doses of PPSV23 before age 36 years should receive another dose of PPSV23 vaccine at age 49 years or later if at least 5 years have passed since the previous dose. Doses of PPSV23 are not needed for people immunized with PPSV23 at or after age 71 years.  Meningococcal vaccine. Adults with asplenia or persistent complement component deficiencies should receive 2 doses of quadrivalent meningococcal conjugate (MenACWY-D) vaccine. The doses should be obtained at least 2 months apart. Microbiologists working with certain meningococcal bacteria, [place] recruits, people at risk during an outbreak, and people who travel to or live  in countries with a high rate of meningitis should be immunized. A first-year college student up through age 49 years who is living in a residence hall should receive a dose if he did not receive a dose on or after his 16th birthday. Adults who have certain high-risk conditions should receive one or more doses of vaccine.  Hepatitis A vaccine. Adults who wish to be protected from this disease, have chronic liver disease, work with hepatitis A-infected animals, work in hepatitis A research labs, or travel to or work in countries with a high rate of hepatitis A should be immunized. Adults who were previously unvaccinated and who anticipate close contact with an international adoptee during the first 60 days after arrival in the [place] from a country with a high rate of hepatitis A should be immunized.  Hepatitis B vaccine. Adults should be immunized if they wish to be protected from this disease, are under age 73 years and have diabetes, have chronic liver disease, have had more than one sex partner in the past 6 months, may be exposed to blood or other infectious body fluids, are household contacts or sex partners of hepatitis B positive people, are clients or workers in certain care facilities, or travel to or work in countries with a high rate of hepatitis B.  Haemophilus influenzae type b (Hib) vaccine. A previously unvaccinated person with asplenia or sickle cell disease or having a scheduled splenectomy should receive 1 dose of Hib vaccine. Regardless of previous immunization, a recipient of a hematopoietic stem cell transplant should receive a 3-dose series 6-12 months after his successful transplant. Hib vaccine is not recommended for adults with HIV infection. Preventive Service / Frequency Ages 54 to 41  Blood pressure check.** / Every 3-5 years.  Lipid and cholesterol check.** / Every 5 years beginning at age 3.  Hepatitis C blood test.** / For any individual with known risks for  hepatitis C.  Skin self-exam. / Monthly.  Influenza vaccine. / Every year.  Tetanus, diphtheria, and acellular pertussis (Tdap, Td) vaccine.** / Consult your health care provider. 1 dose of Td every 10 years.  Varicella vaccine.** / Consult your health care provider.  HPV vaccine. / 3 doses over 6 months, if 37 or younger.  Measles, mumps, rubella (MMR) vaccine.** / You need at least 1 dose of MMR if you were born in 1957 or later. You may also need a second dose.  Pneumococcal 13-valent conjugate (PCV13) vaccine.** / Consult your health care provider.  Pneumococcal polysaccharide (PPSV23) vaccine.** / 1 to 2 doses if you smoke cigarettes or if you have certain conditions.  Meningococcal vaccine.** / 1 dose if you are age 30 to 23  years and a [employer] researcher living in a residence hall, or have one of several medical conditions. You may also need additional booster doses.  Hepatitis A vaccine.** / Consult your health care provider.  Hepatitis B vaccine.** / Consult your health care provider.  Haemophilus influenzae type b (Hib) vaccine.** / Consult your health care provider. Ages 28 to 64  Blood pressure check.** / Every year.  Lipid and cholesterol check.** / Every 5 years beginning at age 18.  Lung cancer screening. / Every year if you are aged 1-80 years and have a 30-pack-year history of smoking and currently smoke or have quit within the past 15 years. Yearly screening is stopped once you have quit smoking for at least 15 years or develop a health problem that would prevent you from having lung cancer treatment.  Fecal occult blood test (FOBT) of stool. / Every year beginning at age 3 and continuing until age 43. You may not have to do this test if you get a colonoscopy every 10 years.  Flexible sigmoidoscopy** or colonoscopy.** / Every 5 years for a flexible sigmoidoscopy or every 10 years for a colonoscopy beginning at age 50 and continuing until age  42.  Hepatitis C blood test.** / For all people born from [birth year] through 1965 and any individual with known risks for hepatitis C.  Skin self-exam. / Monthly.  Influenza vaccine. / Every year.  Tetanus, diphtheria, and acellular pertussis (Tdap/Td) vaccine.** / Consult your health care provider. 1 dose of Td every 10 years.  Varicella vaccine.** / Consult your health care provider.  Zoster vaccine.** / 1 dose for adults aged 59 years or older.  Measles, mumps, rubella (MMR) vaccine.** / You need at least 1 dose of MMR if you were born in 1957 or later. You may also need a second dose.  Pneumococcal 13-valent conjugate (PCV13) vaccine.** / Consult your health care provider.  Pneumococcal polysaccharide (PPSV23) vaccine.** / 1 to 2 doses if you smoke cigarettes or if you have certain conditions.  Meningococcal vaccine.** / Consult your health care provider.  Hepatitis A vaccine.** / Consult your health care provider.  Hepatitis B vaccine.** / Consult your health care provider.  Haemophilus influenzae type b (Hib) vaccine.** / Consult your health care provider. Ages 59 and over  Blood pressure check.** / Every year.  Lipid and cholesterol check.**/ Every 5 years beginning at age 48.  Lung cancer screening. / Every year if you are aged 65-80 years and have a 30-pack-year history of smoking and currently smoke or have quit within the past 15 years. Yearly screening is stopped once you have quit smoking for at least 15 years or develop a health problem that would prevent you from having lung cancer treatment.  Fecal occult blood test (FOBT) of stool. / Every year beginning at age 49 and continuing until age 4. You may not have to do this test if you get a colonoscopy every 10 years.  Flexible sigmoidoscopy** or colonoscopy.** / Every 5 years for a flexible sigmoidoscopy or every 10 years for a colonoscopy beginning at age 33 and continuing until age 57.  Hepatitis C blood test.** /  For all people born from [birth year] through 1965 and any individual with known risks for hepatitis C.  Abdominal aortic aneurysm (AAA) screening.** / A one-time screening for ages 67 to 32 years who are current or former smokers.  Skin self-exam. / Monthly.  Influenza vaccine. / Every year.  Tetanus, diphtheria, and acellular pertussis (Tdap/Td) vaccine.** /  1 dose of Td every 10 years.  Varicella vaccine.** / Consult your health care provider.  Zoster vaccine.** / 1 dose for adults aged 54 years or older.  Pneumococcal 13-valent conjugate (PCV13) vaccine.** / 1 dose for all adults aged 88 years and older.  Pneumococcal polysaccharide (PPSV23) vaccine.** / 1 dose for all adults aged 61 years and older.  Meningococcal vaccine.** / Consult your health care provider.  Hepatitis A vaccine.** / Consult your health care provider.  Hepatitis B vaccine.** / Consult your health care provider.  Haemophilus influenzae type b (Hib) vaccine.** / Consult your health care provider. **Family history and personal history of risk and conditions may change your health care provider's recommendations.   This information is not intended to replace advice given to you by your health care provider. Make sure you discuss any questions you have with your health care provider.   Document Released: 04/26/2001 Document Revised: 03/21/2014 Document Reviewed: 07/26/2010 Elsevier Interactive Patient Education [employer].

## 2017-08-30 ENCOUNTER — Encounter: Payer: Self-pay | Admitting: Gastroenterology

## 2017-08-30 LAB — TSH: TSH: 1.76 u[IU]/mL (ref 0.450–4.500)

## 2017-08-30 LAB — COMPREHENSIVE METABOLIC PANEL
A/G RATIO: 2.5 — AB (ref 1.2–2.2)
ALK PHOS: 78 IU/L (ref 39–117)
ALT: 31 IU/L (ref 0–44)
AST: 30 IU/L (ref 0–40)
Albumin: 4.7 g/dL (ref 3.5–5.5)
BUN/Creatinine Ratio: 13 (ref 9–20)
BUN: 12 mg/dL (ref 6–24)
Bilirubin Total: 0.7 mg/dL (ref 0.0–1.2)
CO2: 21 mmol/L (ref 20–29)
Calcium: 9.5 mg/dL (ref 8.7–10.2)
Chloride: 103 mmol/L (ref 96–106)
Creatinine, Ser: 0.94 mg/dL (ref 0.76–1.27)
GFR calc Af Amer: 108 mL/min/{1.73_m2} (ref >59)
GFR, EST NON AFRICAN AMERICAN: 93 mL/min/{1.73_m2} (ref >59)
GLOBULIN, TOTAL: 1.9 g/dL (ref 1.5–4.5)
Glucose: 105 mg/dL — ABNORMAL HIGH (ref 65–99)
POTASSIUM: 4.5 mmol/L (ref 3.5–5.2)
SODIUM: 141 mmol/L (ref 134–144)
Total Protein: 6.6 g/dL (ref 6.0–8.5)

## 2017-08-30 LAB — HEMOGLOBIN A1C
Est. average glucose Bld gHb Est-mCnc: 114 mg/dL
Hgb A1c MFr Bld: 5.6 % (ref 4.8–5.6)

## 2017-08-30 LAB — CBC WITH DIFFERENTIAL/PLATELET
BASOS: 0 %
Basophils Absolute: 0 10*3/uL (ref 0.0–0.2)
EOS (ABSOLUTE): 0.1 10*3/uL (ref 0.0–0.4)
EOS: 1 %
HEMATOCRIT: 45.9 % (ref 37.5–51.0)
Hemoglobin: 15.7 g/dL (ref 13.0–17.7)
Immature Grans (Abs): 0 10*3/uL (ref 0.0–0.1)
Immature Granulocytes: 0 %
LYMPHS ABS: 1.5 10*3/uL (ref 0.7–3.1)
Lymphs: 31 %
MCH: 30.1 pg (ref 26.6–33.0)
MCHC: 34.2 g/dL (ref 31.5–35.7)
MCV: 88 fL (ref 79–97)
MONOS ABS: 0.4 10*3/uL (ref 0.1–0.9)
Monocytes: 7 %
Neutrophils Absolute: 2.9 10*3/uL (ref 1.4–7.0)
Neutrophils: 61 %
Platelets: 194 10*3/uL (ref 150–450)
RBC: 5.22 x10E6/uL (ref 4.14–5.80)
RDW: 13.3 % (ref 12.3–15.4)
WBC: 4.9 10*3/uL (ref 3.4–10.8)

## 2017-08-30 LAB — LIPID PANEL
Chol/HDL Ratio: 4.5 ratio (ref 0.0–5.0)
Cholesterol, Total: 176 mg/dL (ref 100–199)
HDL: 39 mg/dL — ABNORMAL LOW (ref >39)
LDL Calculated: 87 mg/dL (ref 0–99)
TRIGLYCERIDES: 249 mg/dL — AB (ref 0–149)
VLDL Cholesterol Cal: 50 mg/dL — ABNORMAL HIGH (ref 5–40)

## 2017-08-30 LAB — T4, FREE: Free T4: 1.51 ng/dL (ref 0.82–1.77)

## 2017-08-30 LAB — VITAMIN D 25 HYDROXY (VIT D DEFICIENCY, FRACTURES): VIT D 25 HYDROXY: 46.1 ng/mL (ref 30.0–100.0)

## 2017-10-17 ENCOUNTER — Ambulatory Visit: Payer: BLUE CROSS/BLUE SHIELD

## 2017-10-18 ENCOUNTER — Ambulatory Visit (AMBULATORY_SURGERY_CENTER): Payer: Self-pay

## 2017-10-18 VITALS — Ht 72.0 in | Wt 206.8 lb

## 2017-10-18 DIAGNOSIS — Z1211 Encounter for screening for malignant neoplasm of colon: Secondary | ICD-10-CM

## 2017-10-18 MED ORDER — PEG-KCL-NACL-NASULF-NA ASC-C 140 G PO SOLR
1.0000 | Freq: Once | ORAL | Status: AC
Start: 2017-10-18 — End: 2017-10-18

## 2017-10-18 NOTE — Progress Notes (Signed)
Per pt, no allergies to soy or egg products.Pt not taking any weight loss meds or using  O2 at home.  Emmi video sent to patient's email 

## 2017-10-25 ENCOUNTER — Encounter: Payer: Self-pay | Admitting: Gastroenterology

## 2017-10-25 ENCOUNTER — Telehealth: Payer: Self-pay | Admitting: Gastroenterology

## 2017-10-25 NOTE — Telephone Encounter (Signed)
Edward Pierce called patient back. Patient's wife did not take the coupon with her to pick up the prep. Patient aware of coupon and will take back to the pharmacy. If any additional problems. Patient has agreed to call back.

## 2017-10-25 NOTE — Telephone Encounter (Signed)
Patient states insurance requires a prior auth for plenvu prep and wants to know if he needs something else prescribed or what to do. Patients colon is scheduled for 8.20.19

## 2017-10-31 ENCOUNTER — Ambulatory Visit (AMBULATORY_SURGERY_CENTER): Payer: BLUE CROSS/BLUE SHIELD | Admitting: Gastroenterology

## 2017-10-31 ENCOUNTER — Encounter: Payer: Self-pay | Admitting: Gastroenterology

## 2017-10-31 VITALS — BP 128/82 | HR 63 | Temp 99.8°F | Resp 12 | Ht 72.0 in | Wt 206.0 lb

## 2017-10-31 DIAGNOSIS — Z1211 Encounter for screening for malignant neoplasm of colon: Secondary | ICD-10-CM

## 2017-10-31 DIAGNOSIS — D122 Benign neoplasm of ascending colon: Secondary | ICD-10-CM

## 2017-10-31 MED ORDER — SODIUM CHLORIDE 0.9 % IV SOLN: 500.0000 mL | Freq: Once | INTRAVENOUS | Status: DC

## 2017-10-31 NOTE — Progress Notes (Signed)
Pt's states no medical or surgical changes since previsit or office visit. 

## 2017-10-31 NOTE — Patient Instructions (Signed)
YOU HAD AN ENDOSCOPIC PROCEDURE TODAY AT THE [facility]:   Refer to the procedure report that was given to you for any specific questions about what was found during the examination.  If the procedure report does not answer your questions, please call your gastroenterologist to clarify.  If you requested that your care partner not be given the details of your procedure findings, then the procedure report has been included in a sealed envelope for you to review at your convenience later.  YOU SHOULD EXPECT: Some feelings of bloating in the abdomen. Passage of more gas than usual.  Walking can help get rid of the air that was put into your GI tract during the procedure and reduce the bloating. If you had a lower endoscopy (such as a colonoscopy or flexible sigmoidoscopy) you may notice spotting of blood in your stool or on the toilet paper. If you underwent a bowel prep for your procedure, you may not have a normal bowel movement for a few days.  Please Note:  You might notice some irritation and congestion in your nose or some drainage.  This is from the oxygen used during your procedure.  There is no need for concern and it should clear up in a day or so.  SYMPTOMS TO REPORT IMMEDIATELY:   Following lower endoscopy (colonoscopy or flexible sigmoidoscopy):  Excessive amounts of blood in the stool  Significant tenderness or worsening of abdominal pains  Swelling of the abdomen that is new, acute  Fever of 100F or higher   For urgent or emergent issues, a gastroenterologist can be reached at any hour by calling [phone].   DIET:  We do recommend a small meal at first, but then you may proceed to your regular diet.  Drink plenty of fluids but you should avoid alcoholic beverages for 24 hours.  ACTIVITY:  You should plan to take it easy for the rest of today and you should NOT DRIVE or use heavy machinery until tomorrow (because of the sedation medicines used during the test).     FOLLOW UP: Our staff will call the number listed on your records the next business day following your procedure to check on you and address any questions or concerns that you may have regarding the information given to you following your procedure. If we do not reach you, we will leave a message.  However, if you are feeling well and you are not experiencing any problems, there is no need to return our call.  We will assume that you have returned to your regular daily activities without incident.  If any biopsies were taken you will be contacted by phone or by letter within the next 1-3 weeks.  Please call us at [phone] if you have not heard about the biopsies in 3 weeks.    SIGNATURES/CONFIDENTIALITY: You and/or your care partner have signed paperwork which will be entered into your electronic medical record.  These signatures attest to the fact that that the information above on your After Visit Summary has been reviewed and is understood.  Full responsibility of the confidentiality of this discharge information lies with you and/or your care-partner.  Polyp information given. 

## 2017-10-31 NOTE — Progress Notes (Signed)
To PACU, vss patent aw report to rn 

## 2017-10-31 NOTE — Progress Notes (Signed)
Called to room to assist during endoscopic procedure.  Patient ID and intended procedure confirmed with present staff. Received instructions for my participation in the procedure from the performing physician.  

## 2017-10-31 NOTE — Op Note (Signed)
[place] Patient Name: Edward Pierce Procedure Date: 10/31/2017 1:20 PM MRN: [id number] Endoscopist: Mallie Mussel L. Loletha Carrow , MD Age: 52 Referring MD:  Date of Birth: 1965-09-15 Gender: Male Account #: [id number] Procedure:                Colonoscopy Indications:              Screening for colorectal malignant neoplasm, This                            is the patient's first colonoscopy Medicines:                Monitored Anesthesia Care Procedure:                Pre-Anesthesia Assessment:                           - Prior to the procedure, a History and Physical                            was performed, and patient medications and                            allergies were reviewed. The patient's tolerance of                            previous anesthesia was also reviewed. The risks                            and benefits of the procedure and the sedation                            options and risks were discussed with the patient.                            All questions were answered, and informed consent                            was obtained. Prior Anticoagulants: The patient has                            taken no previous anticoagulant or antiplatelet                            agents. ASA Grade Assessment: II - A patient with                            mild systemic disease. After reviewing the risks                            and benefits, the patient was deemed in                            satisfactory condition to undergo the procedure.  After obtaining informed consent, the colonoscope                            was passed under direct vision. Throughout the                            procedure, the patient's blood pressure, pulse, and                            oxygen saturations were monitored continuously. The                            Colonoscope was introduced through the anus and                            advanced to the the terminal  ileum, with                            identification of the appendiceal orifice and IC                            valve. The colonoscopy was performed without                            difficulty. The patient tolerated the procedure                            well. The quality of the bowel preparation was                            excellent. The terminal ileum, ileocecal valve,                            appendiceal orifice, and rectum were photographed.                            The quality of the bowel preparation was evaluated                            using the BBPS [facility] Bowel Preparation Scale)                            with scores of: Right Colon = 3, Transverse Colon =                            3 and Left Colon = 3 (entire mucosa seen well with                            no residual staining, small fragments of stool or                            opaque liquid). The total BBPS score equals 9. Scope In: 1:39:28 PM Scope Out: 1:50:39 PM Scope  Withdrawal Time: 0 hours 8 minutes 34 seconds  Total Procedure Duration: 0 hours 11 minutes 11 seconds  Findings:                 The perianal and digital rectal examinations were                            normal.                           A 4 mm polyp was found in the ascending colon. The                            polyp was sessile. The polyp was removed with a                            cold snare. Resection and retrieval were complete.                           The exam was otherwise without abnormality on                            direct and retroflexion views. Complications:            No immediate complications. Estimated Blood Loss:     Estimated blood loss was minimal. Impression:               - One 4 mm polyp in the ascending colon, removed                            with a cold snare. Resected and retrieved.                           - The examination was otherwise normal on direct                            and  retroflexion views. Recommendation:           - Patient has a contact number available for                            emergencies. The signs and symptoms of potential                            delayed complications were discussed with the                            patient. Return to normal activities tomorrow.                            Written discharge instructions were provided to the                            patient.                           -  Resume previous diet.                           - Continue present medications.                           - Await pathology results.                           - Repeat colonoscopy is recommended for                            surveillance. The colonoscopy date will be                            determined after pathology results from today's                            exam become available for review. Henry L. Loletha Carrow, MD 10/31/2017 1:55:12 PM This report has been signed electronically.

## 2017-11-01 ENCOUNTER — Telehealth: Payer: Self-pay

## 2017-11-01 NOTE — Telephone Encounter (Signed)
First attempt to call pt, no voicemail set up, can't leave message.

## 2017-11-01 NOTE — Telephone Encounter (Signed)
  Follow up Call-  Call back number 10/31/2017  Post procedure Call Back phone  # [phone]  Permission to leave phone message Yes  Some recent data might be hidden     Patient questions:  Do you have a fever, pain , or abdominal swelling? No. Pain Score  0 *  Have you tolerated food without any problems? Yes.    Have you been able to return to your normal activities? Yes.    Do you have any questions about your discharge instructions: Diet   No. Medications  No. Follow up visit  No.  Do you have questions or concerns about your Care? No.  Actions: * If pain score is 4 or above: No action needed, pain <4.

## 2017-11-08 ENCOUNTER — Encounter: Payer: Self-pay | Admitting: Gastroenterology

## 2018-03-08 ENCOUNTER — Other Ambulatory Visit: Payer: Self-pay | Admitting: Family Medicine

## 2018-03-08 ENCOUNTER — Telehealth: Payer: Self-pay | Admitting: Family Medicine

## 2018-03-08 DIAGNOSIS — E782 Mixed hyperlipidemia: Secondary | ICD-10-CM

## 2018-03-08 NOTE — Telephone Encounter (Signed)
-----   Message from Jerilee Field, [place] sent at 03/08/2018 12:00 PM EST ----- Patient is due for follow up, please call the patient to make an appointment.  30 day supply of medication sent into the pharmacy. Thanks. MPulliam, CMA/RT(R)

## 2018-03-08 NOTE — Telephone Encounter (Signed)
Called pt to advise 30dys worth of Rx sent in to pharmacy but he will need an OV before any addt'l will be sent.  ---- Patient states has a $3000 unaffordable deductible to meet before Insurance kicks in to pay for any chrgs & he states doesn't have that kind of money to come often as required by provider.  --- Advised him that Dorothea Ogle would review his account for any billing discrepancies, otherwise he would need to have an OV before any further refills.  --Forwarding  Message to medical assistant as an Pharmacist, hospital.

## 2018-03-08 NOTE — Telephone Encounter (Signed)
Noted MPulliam, CMA/RT(R)  

## 2018-04-06 ENCOUNTER — Other Ambulatory Visit: Payer: Self-pay | Admitting: Family Medicine

## 2018-04-06 DIAGNOSIS — E782 Mixed hyperlipidemia: Secondary | ICD-10-CM

## 2018-04-20 ENCOUNTER — Other Ambulatory Visit: Payer: Self-pay | Admitting: Family Medicine

## 2018-04-20 DIAGNOSIS — E782 Mixed hyperlipidemia: Secondary | ICD-10-CM

## 2018-05-20 ENCOUNTER — Other Ambulatory Visit: Payer: Self-pay | Admitting: Family Medicine

## 2018-05-20 DIAGNOSIS — E782 Mixed hyperlipidemia: Secondary | ICD-10-CM

## 2019-03-21 DIAGNOSIS — Z20828 Contact with and (suspected) exposure to other viral communicable diseases: Secondary | ICD-10-CM | POA: Diagnosis not present

## 2019-10-01 ENCOUNTER — Emergency Department (HOSPITAL_COMMUNITY): Payer: BC Managed Care – PPO

## 2019-10-01 ENCOUNTER — Other Ambulatory Visit: Payer: Self-pay

## 2019-10-01 ENCOUNTER — Inpatient Hospital Stay (HOSPITAL_COMMUNITY)
Admission: EM | Admit: 2019-10-01 | Discharge: 2019-10-05 | DRG: 246 | Disposition: A | Discharge status: Home / Self Care | Payer: BC Managed Care – PPO | Attending: Cardiology | Admitting: Cardiology

## 2019-10-01 ENCOUNTER — Encounter (HOSPITAL_COMMUNITY): Payer: Self-pay | Admitting: Emergency Medicine

## 2019-10-01 DIAGNOSIS — E782 Mixed hyperlipidemia: Secondary | ICD-10-CM | POA: Diagnosis present

## 2019-10-01 DIAGNOSIS — Z20822 Contact with and (suspected) exposure to covid-19: Secondary | ICD-10-CM | POA: Diagnosis present

## 2019-10-01 DIAGNOSIS — R030 Elevated blood-pressure reading, without diagnosis of hypertension: Secondary | ICD-10-CM | POA: Diagnosis present

## 2019-10-01 DIAGNOSIS — I472 Ventricular tachycardia, unspecified: Secondary | ICD-10-CM

## 2019-10-01 DIAGNOSIS — Z955 Presence of coronary angioplasty implant and graft: Secondary | ICD-10-CM

## 2019-10-01 DIAGNOSIS — I5020 Unspecified systolic (congestive) heart failure: Secondary | ICD-10-CM | POA: Diagnosis present

## 2019-10-01 DIAGNOSIS — I25111 Atherosclerotic heart disease of native coronary artery with angina pectoris with documented spasm: Secondary | ICD-10-CM | POA: Diagnosis not present

## 2019-10-01 DIAGNOSIS — Z79899 Other long term (current) drug therapy: Secondary | ICD-10-CM | POA: Diagnosis not present

## 2019-10-01 DIAGNOSIS — I959 Hypotension, unspecified: Secondary | ICD-10-CM | POA: Diagnosis present

## 2019-10-01 DIAGNOSIS — E559 Vitamin D deficiency, unspecified: Secondary | ICD-10-CM | POA: Diagnosis not present

## 2019-10-01 DIAGNOSIS — I5021 Acute systolic (congestive) heart failure: Secondary | ICD-10-CM | POA: Diagnosis not present

## 2019-10-01 DIAGNOSIS — I447 Left bundle-branch block, unspecified: Secondary | ICD-10-CM | POA: Diagnosis not present

## 2019-10-01 DIAGNOSIS — R079 Chest pain, unspecified: Secondary | ICD-10-CM | POA: Diagnosis not present

## 2019-10-01 DIAGNOSIS — I214 Non-ST elevation (NSTEMI) myocardial infarction: Principal | ICD-10-CM | POA: Diagnosis present

## 2019-10-01 DIAGNOSIS — E785 Hyperlipidemia, unspecified: Secondary | ICD-10-CM | POA: Diagnosis not present

## 2019-10-01 DIAGNOSIS — I255 Ischemic cardiomyopathy: Secondary | ICD-10-CM

## 2019-10-01 DIAGNOSIS — Z9189 Other specified personal risk factors, not elsewhere classified: Secondary | ICD-10-CM

## 2019-10-01 DIAGNOSIS — R55 Syncope and collapse: Secondary | ICD-10-CM | POA: Diagnosis not present

## 2019-10-01 DIAGNOSIS — I2510 Atherosclerotic heart disease of native coronary artery without angina pectoris: Secondary | ICD-10-CM | POA: Diagnosis not present

## 2019-10-01 DIAGNOSIS — I110 Hypertensive heart disease with heart failure: Secondary | ICD-10-CM | POA: Diagnosis not present

## 2019-10-01 DIAGNOSIS — E781 Pure hyperglyceridemia: Secondary | ICD-10-CM | POA: Diagnosis present

## 2019-10-01 DIAGNOSIS — I10 Essential (primary) hypertension: Secondary | ICD-10-CM

## 2019-10-01 DIAGNOSIS — Z9861 Coronary angioplasty status: Secondary | ICD-10-CM | POA: Diagnosis not present

## 2019-10-01 DIAGNOSIS — I513 Intracardiac thrombosis, not elsewhere classified: Secondary | ICD-10-CM | POA: Diagnosis not present

## 2019-10-01 DIAGNOSIS — I213 ST elevation (STEMI) myocardial infarction of unspecified site: Secondary | ICD-10-CM | POA: Diagnosis not present

## 2019-10-01 DIAGNOSIS — R0789 Other chest pain: Secondary | ICD-10-CM | POA: Diagnosis not present

## 2019-10-01 LAB — BASIC METABOLIC PANEL
Anion gap: 10 (ref 5–15)
BUN: 10 mg/dL (ref 6–20)
CO2: 25 mmol/L (ref 22–32)
Calcium: 10 mg/dL (ref 8.9–10.3)
Chloride: 101 mmol/L (ref 98–111)
Creatinine, Ser: 0.91 mg/dL (ref 0.61–1.24)
GFR calc Af Amer: >60 mL/min (ref >60)
GFR calc non Af Amer: >60 mL/min (ref >60)
Glucose, Bld: 125 mg/dL — ABNORMAL HIGH (ref 70–99)
Potassium: 3.8 mmol/L (ref 3.5–5.1)
Sodium: 136 mmol/L (ref 135–145)

## 2019-10-01 LAB — CBC
HCT: 48.3 % (ref 39.0–52.0)
Hemoglobin: 16.3 g/dL (ref 13.0–17.0)
MCH: 29.8 pg (ref 26.0–34.0)
MCHC: 33.7 g/dL (ref 30.0–36.0)
MCV: 88.3 fL (ref 80.0–100.0)
Platelets: 218 10*3/uL (ref 150–400)
RBC: 5.47 MIL/uL (ref 4.22–5.81)
RDW: 12.6 % (ref 11.5–15.5)
WBC: 9.6 10*3/uL (ref 4.0–10.5)
nRBC: 0 % (ref 0.0–0.2)

## 2019-10-01 LAB — TROPONIN I (HIGH SENSITIVITY): Troponin I (High Sensitivity): 185 ng/L (ref <18)

## 2019-10-01 MED ORDER — SODIUM CHLORIDE 0.9% FLUSH
3.0000 mL | Freq: Once | INTRAVENOUS | Status: AC
  Administered 2019-10-02: 3 mL via INTRAVENOUS

## 2019-10-01 NOTE — ED Triage Notes (Signed)
Pt presents to ED BIB [facility]. Pt c/o central CP that he describes as indigestion. Pain began with exertion. Pt reports feeling diaphoretic and dizzy w/ pain. Currently pain has improved pain is 1/10.

## 2019-10-02 ENCOUNTER — Encounter (HOSPITAL_COMMUNITY)
Admission: EM | Disposition: A | Discharge status: Home / Self Care | Payer: Self-pay | Source: Home / Self Care | Attending: Cardiology

## 2019-10-02 ENCOUNTER — Encounter (HOSPITAL_COMMUNITY): Payer: Self-pay | Admitting: Internal Medicine

## 2019-10-02 ENCOUNTER — Inpatient Hospital Stay (HOSPITAL_COMMUNITY): Payer: BC Managed Care – PPO

## 2019-10-02 DIAGNOSIS — I252 Old myocardial infarction: Secondary | ICD-10-CM

## 2019-10-02 DIAGNOSIS — I2510 Atherosclerotic heart disease of native coronary artery without angina pectoris: Secondary | ICD-10-CM

## 2019-10-02 DIAGNOSIS — E559 Vitamin D deficiency, unspecified: Secondary | ICD-10-CM | POA: Diagnosis present

## 2019-10-02 DIAGNOSIS — I959 Hypotension, unspecified: Secondary | ICD-10-CM | POA: Diagnosis present

## 2019-10-02 DIAGNOSIS — I472 Ventricular tachycardia: Secondary | ICD-10-CM | POA: Diagnosis present

## 2019-10-02 DIAGNOSIS — I214 Non-ST elevation (NSTEMI) myocardial infarction: Secondary | ICD-10-CM | POA: Diagnosis present

## 2019-10-02 DIAGNOSIS — E782 Mixed hyperlipidemia: Secondary | ICD-10-CM | POA: Diagnosis present

## 2019-10-02 DIAGNOSIS — I255 Ischemic cardiomyopathy: Secondary | ICD-10-CM | POA: Diagnosis present

## 2019-10-02 DIAGNOSIS — Z79899 Other long term (current) drug therapy: Secondary | ICD-10-CM | POA: Diagnosis not present

## 2019-10-02 DIAGNOSIS — I442 Atrioventricular block, complete: Secondary | ICD-10-CM

## 2019-10-02 DIAGNOSIS — I5021 Acute systolic (congestive) heart failure: Secondary | ICD-10-CM | POA: Diagnosis present

## 2019-10-02 DIAGNOSIS — Z20822 Contact with and (suspected) exposure to covid-19: Secondary | ICD-10-CM | POA: Diagnosis present

## 2019-10-02 DIAGNOSIS — I25111 Atherosclerotic heart disease of native coronary artery with angina pectoris with documented spasm: Secondary | ICD-10-CM | POA: Diagnosis not present

## 2019-10-02 DIAGNOSIS — I110 Hypertensive heart disease with heart failure: Secondary | ICD-10-CM | POA: Diagnosis present

## 2019-10-02 DIAGNOSIS — Z9189 Other specified personal risk factors, not elsewhere classified: Secondary | ICD-10-CM | POA: Diagnosis not present

## 2019-10-02 DIAGNOSIS — R030 Elevated blood-pressure reading, without diagnosis of hypertension: Secondary | ICD-10-CM | POA: Diagnosis not present

## 2019-10-02 HISTORY — PX: CORONARY THROMBECTOMY: CATH118304

## 2019-10-02 HISTORY — DX: Old myocardial infarction: I25.2

## 2019-10-02 HISTORY — PX: LEFT HEART CATH AND CORONARY ANGIOGRAPHY: CATH118249

## 2019-10-02 HISTORY — PX: CORONARY STENT INTERVENTION: CATH118234

## 2019-10-02 HISTORY — PX: TRANSTHORACIC ECHOCARDIOGRAM: SHX275

## 2019-10-02 LAB — LIPID PANEL
Cholesterol: 323 mg/dL — ABNORMAL HIGH (ref 0–200)
HDL: 47 mg/dL (ref >40)
LDL Cholesterol: 241 mg/dL — ABNORMAL HIGH (ref 0–99)
Total CHOL/HDL Ratio: 6.9 RATIO
Triglycerides: 177 mg/dL — ABNORMAL HIGH (ref <150)
VLDL: 35 mg/dL (ref 0–40)

## 2019-10-02 LAB — HEMOGLOBIN A1C
Hgb A1c MFr Bld: 5.6 % (ref 4.8–5.6)
Mean Plasma Glucose: 114.02 mg/dL

## 2019-10-02 LAB — CBC
HCT: 42.4 % (ref 39.0–52.0)
Hemoglobin: 14.3 g/dL (ref 13.0–17.0)
MCH: 29.2 pg (ref 26.0–34.0)
MCHC: 33.7 g/dL (ref 30.0–36.0)
MCV: 86.7 fL (ref 80.0–100.0)
Platelets: 212 10*3/uL (ref 150–400)
RBC: 4.89 MIL/uL (ref 4.22–5.81)
RDW: 13 % (ref 11.5–15.5)
WBC: 6.4 10*3/uL (ref 4.0–10.5)
nRBC: 0 % (ref 0.0–0.2)

## 2019-10-02 LAB — TROPONIN I (HIGH SENSITIVITY)
Troponin I (High Sensitivity): 665 ng/L (ref <18)
Troponin I (High Sensitivity): 26933 ng/L (ref <18)

## 2019-10-02 LAB — ECHOCARDIOGRAM COMPLETE
Area-P 1/2: 4.31 cm2
Height: 71 in
S' Lateral: 3.3 cm
Single Plane A4C EF: 55.7 %
Weight: 3308.66 oz

## 2019-10-02 LAB — MRSA PCR SCREENING: MRSA by PCR: NEGATIVE

## 2019-10-02 LAB — SARS CORONAVIRUS 2 BY RT PCR (HOSPITAL ORDER, PERFORMED IN ~~LOC~~ HOSPITAL LAB): SARS Coronavirus 2: NEGATIVE

## 2019-10-02 LAB — HEPARIN LEVEL (UNFRACTIONATED): Heparin Unfractionated: 0.64 IU/mL (ref 0.30–0.70)

## 2019-10-02 LAB — TSH: TSH: 1.807 u[IU]/mL (ref 0.350–4.500)

## 2019-10-02 LAB — MAGNESIUM: Magnesium: 1.8 mg/dL (ref 1.7–2.4)

## 2019-10-02 LAB — POCT ACTIVATED CLOTTING TIME: Activated Clotting Time: 362 seconds

## 2019-10-02 LAB — HIV ANTIBODY (ROUTINE TESTING W REFLEX): HIV Screen 4th Generation wRfx: NONREACTIVE

## 2019-10-02 SURGERY — LEFT HEART CATH AND CORONARY ANGIOGRAPHY
Anesthesia: LOCAL

## 2019-10-02 MED ORDER — TICAGRELOR 90 MG PO TABS
90.0000 mg | ORAL_TABLET | Freq: Two times a day (BID) | ORAL | Status: DC
  Administered 2019-10-02 – 2019-10-05 (×6): 90 mg via ORAL
  Filled 2019-10-02 (×6): qty 1

## 2019-10-02 MED ORDER — HEPARIN SODIUM (PORCINE) 1000 UNIT/ML IJ SOLN
INTRAMUSCULAR | Status: AC
  Filled 2019-10-02: qty 1

## 2019-10-02 MED ORDER — SODIUM CHLORIDE 0.9 % WEIGHT BASED INFUSION: 1.0000 mL/kg/h | INTRAVENOUS | Status: DC

## 2019-10-02 MED ORDER — LABETALOL HCL 5 MG/ML IV SOLN: 10.0000 mg | INTRAVENOUS | Status: AC | PRN

## 2019-10-02 MED ORDER — SODIUM CHLORIDE 0.9% FLUSH: 3.0000 mL | INTRAVENOUS | Status: DC | PRN

## 2019-10-02 MED ORDER — LIDOCAINE HCL (PF) 1 % IJ SOLN
INTRAMUSCULAR | Status: DC | PRN
  Administered 2019-10-02: 5 mL via SUBCUTANEOUS

## 2019-10-02 MED ORDER — HEPARIN BOLUS VIA INFUSION
4000.0000 [IU] | Freq: Once | INTRAVENOUS | Status: AC
  Administered 2019-10-02: 4000 [IU] via INTRAVENOUS
  Filled 2019-10-02: qty 4000

## 2019-10-02 MED ORDER — HEPARIN SODIUM (PORCINE) 1000 UNIT/ML IJ SOLN
INTRAMUSCULAR | Status: DC | PRN
  Administered 2019-10-02: 5000 [IU] via INTRAVENOUS
  Administered 2019-10-02: 6000 [IU] via INTRAVENOUS

## 2019-10-02 MED ORDER — HEPARIN (PORCINE) 25000 UT/250ML-% IV SOLN
1300.0000 [IU]/h | INTRAVENOUS | Status: DC
  Administered 2019-10-02: 1300 [IU]/h via INTRAVENOUS
  Filled 2019-10-02: qty 250

## 2019-10-02 MED ORDER — CHLORHEXIDINE GLUCONATE CLOTH 2 % EX PADS
6.0000 | MEDICATED_PAD | Freq: Every day | CUTANEOUS | Status: DC
  Administered 2019-10-02 – 2019-10-04 (×3): 6 via TOPICAL

## 2019-10-02 MED ORDER — NITROGLYCERIN 1 MG/10 ML FOR IR/CATH LAB
INTRA_ARTERIAL | Status: AC
  Filled 2019-10-02: qty 10

## 2019-10-02 MED ORDER — VERAPAMIL HCL 2.5 MG/ML IV SOLN
INTRA_ARTERIAL | Status: DC | PRN
  Administered 2019-10-02 (×2): 5 mL via INTRA_ARTERIAL

## 2019-10-02 MED ORDER — ACETAMINOPHEN 325 MG PO TABS
650.0000 mg | ORAL_TABLET | ORAL | Status: DC | PRN
  Administered 2019-10-02 – 2019-10-03 (×2): 650 mg via ORAL
  Filled 2019-10-02: qty 2

## 2019-10-02 MED ORDER — SODIUM CHLORIDE 0.9% FLUSH
3.0000 mL | Freq: Two times a day (BID) | INTRAVENOUS | Status: DC
  Administered 2019-10-02 – 2019-10-05 (×5): 3 mL via INTRAVENOUS

## 2019-10-02 MED ORDER — VERAPAMIL HCL 2.5 MG/ML IV SOLN
INTRAVENOUS | Status: AC
  Filled 2019-10-02: qty 2

## 2019-10-02 MED ORDER — SODIUM CHLORIDE 0.9% FLUSH: 3.0000 mL | Freq: Two times a day (BID) | INTRAVENOUS | Status: DC

## 2019-10-02 MED ORDER — NITROGLYCERIN 2 % TD OINT
1.0000 [in_us] | TOPICAL_OINTMENT | Freq: Four times a day (QID) | TRANSDERMAL | Status: DC
  Administered 2019-10-02 – 2019-10-03 (×5): 1 [in_us] via TOPICAL
  Filled 2019-10-02: qty 30

## 2019-10-02 MED ORDER — TIROFIBAN HCL IV 12.5 MG/250 ML
INTRAVENOUS | Status: AC | PRN
  Administered 2019-10-02: 0.15 ug/kg/min via INTRAVENOUS

## 2019-10-02 MED ORDER — NITROGLYCERIN 0.4 MG SL SUBL
0.4000 mg | SUBLINGUAL_TABLET | SUBLINGUAL | Status: DC | PRN
  Administered 2019-10-02: 0.4 mg via SUBLINGUAL
  Filled 2019-10-02: qty 1

## 2019-10-02 MED ORDER — MIDAZOLAM HCL 2 MG/2ML IJ SOLN
INTRAMUSCULAR | Status: DC | PRN
  Administered 2019-10-02 (×2): 1 mg via INTRAVENOUS

## 2019-10-02 MED ORDER — ONDANSETRON HCL 4 MG/2ML IJ SOLN: 4.0000 mg | Freq: Four times a day (QID) | INTRAMUSCULAR | Status: DC | PRN

## 2019-10-02 MED ORDER — METOPROLOL TARTRATE 25 MG PO TABS: 12.5000 mg | ORAL_TABLET | Freq: Two times a day (BID) | ORAL | Status: DC

## 2019-10-02 MED ORDER — LIDOCAINE HCL (PF) 1 % IJ SOLN
INTRAMUSCULAR | Status: AC
  Filled 2019-10-02: qty 30

## 2019-10-02 MED ORDER — MIDAZOLAM HCL 2 MG/2ML IJ SOLN
INTRAMUSCULAR | Status: AC
  Filled 2019-10-02: qty 2

## 2019-10-02 MED ORDER — AMIODARONE IV BOLUS ONLY 150 MG/100ML
150.0000 mg | Freq: Once | INTRAVENOUS | Status: AC
  Administered 2019-10-02: 150 mg via INTRAVENOUS
  Filled 2019-10-02: qty 100

## 2019-10-02 MED ORDER — FENTANYL CITRATE (PF) 100 MCG/2ML IJ SOLN
INTRAMUSCULAR | Status: AC
  Filled 2019-10-02: qty 2

## 2019-10-02 MED ORDER — ASPIRIN 81 MG PO CHEW
324.0000 mg | CHEWABLE_TABLET | Freq: Once | ORAL | Status: AC
  Administered 2019-10-02: 324 mg via ORAL
  Filled 2019-10-02: qty 4

## 2019-10-02 MED ORDER — TIROFIBAN HCL IN NACL 5-0.9 MG/100ML-% IV SOLN
INTRAVENOUS | Status: AC
  Filled 2019-10-02: qty 100

## 2019-10-02 MED ORDER — CAPTOPRIL 12.5 MG PO TABS
12.5000 mg | ORAL_TABLET | Freq: Three times a day (TID) | ORAL | Status: DC
  Administered 2019-10-02 (×4): 12.5 mg via ORAL
  Filled 2019-10-02 (×5): qty 1

## 2019-10-02 MED ORDER — TICAGRELOR 90 MG PO TABS
ORAL_TABLET | ORAL | Status: DC | PRN
  Administered 2019-10-02: 180 mg via ORAL

## 2019-10-02 MED ORDER — ASPIRIN 81 MG PO CHEW: 324.0000 mg | CHEWABLE_TABLET | ORAL | Status: DC

## 2019-10-02 MED ORDER — MORPHINE SULFATE (PF) 2 MG/ML IV SOLN: 2.0000 mg | INTRAVENOUS | Status: DC | PRN

## 2019-10-02 MED ORDER — SODIUM CHLORIDE 0.9 % WEIGHT BASED INFUSION: 3.0000 mL/kg/h | INTRAVENOUS | Status: DC

## 2019-10-02 MED ORDER — SODIUM CHLORIDE 0.9 % IV SOLN: INTRAVENOUS | Status: AC

## 2019-10-02 MED ORDER — ASPIRIN EC 81 MG PO TBEC
81.0000 mg | DELAYED_RELEASE_TABLET | Freq: Every day | ORAL | Status: DC
  Administered 2019-10-02 – 2019-10-05 (×4): 81 mg via ORAL
  Filled 2019-10-02 (×4): qty 1

## 2019-10-02 MED ORDER — ATORVASTATIN CALCIUM 80 MG PO TABS
80.0000 mg | ORAL_TABLET | Freq: Every day | ORAL | Status: DC
  Administered 2019-10-02 – 2019-10-04 (×3): 80 mg via ORAL
  Filled 2019-10-02 (×3): qty 1

## 2019-10-02 MED ORDER — TICAGRELOR 90 MG PO TABS
ORAL_TABLET | ORAL | Status: AC
  Filled 2019-10-02: qty 1

## 2019-10-02 MED ORDER — NITROGLYCERIN 2 % TD OINT
1.0000 [in_us] | TOPICAL_OINTMENT | Freq: Once | TRANSDERMAL | Status: AC
  Administered 2019-10-02: 1 [in_us] via TOPICAL
  Filled 2019-10-02: qty 1

## 2019-10-02 MED ORDER — MAGNESIUM SULFATE 2 GM/50ML IV SOLN
2.0000 g | Freq: Once | INTRAVENOUS | Status: AC
  Administered 2019-10-02: 2 g via INTRAVENOUS
  Filled 2019-10-02: qty 50

## 2019-10-02 MED ORDER — POTASSIUM CHLORIDE CRYS ER 20 MEQ PO TBCR
40.0000 meq | EXTENDED_RELEASE_TABLET | Freq: Once | ORAL | Status: AC
  Administered 2019-10-02: 40 meq via ORAL
  Filled 2019-10-02: qty 2

## 2019-10-02 MED ORDER — HYDRALAZINE HCL 20 MG/ML IJ SOLN: 10.0000 mg | INTRAMUSCULAR | Status: AC | PRN

## 2019-10-02 MED ORDER — HEPARIN (PORCINE) IN NACL 1000-0.9 UT/500ML-% IV SOLN
INTRAVENOUS | Status: AC
  Filled 2019-10-02: qty 1000

## 2019-10-02 MED ORDER — CAPTOPRIL 12.5 MG PO TABS: 12.5000 mg | ORAL_TABLET | Freq: Three times a day (TID) | ORAL | Status: DC

## 2019-10-02 MED ORDER — ASPIRIN 300 MG RE SUPP: 300.0000 mg | RECTAL | Status: DC

## 2019-10-02 MED ORDER — FENTANYL CITRATE (PF) 100 MCG/2ML IJ SOLN
INTRAMUSCULAR | Status: DC | PRN
  Administered 2019-10-02 (×2): 25 ug via INTRAVENOUS

## 2019-10-02 MED ORDER — AMIODARONE HCL IN DEXTROSE 360-4.14 MG/200ML-% IV SOLN
30.0000 mg/h | INTRAVENOUS | Status: DC
  Administered 2019-10-02: 30 mg/h via INTRAVENOUS
  Filled 2019-10-02: qty 200

## 2019-10-02 MED ORDER — ACETAMINOPHEN 325 MG PO TABS
650.0000 mg | ORAL_TABLET | ORAL | Status: DC | PRN
  Filled 2019-10-02: qty 2

## 2019-10-02 MED ORDER — ASPIRIN 81 MG PO CHEW: 81.0000 mg | CHEWABLE_TABLET | Freq: Every day | ORAL | Status: DC

## 2019-10-02 MED ORDER — ATORVASTATIN CALCIUM 40 MG PO TABS
40.0000 mg | ORAL_TABLET | Freq: Every day | ORAL | Status: DC
  Administered 2019-10-02: 40 mg via ORAL
  Filled 2019-10-02: qty 1

## 2019-10-02 MED ORDER — TIROFIBAN HCL IN NACL 5-0.9 MG/100ML-% IV SOLN
0.1500 ug/kg/min | INTRAVENOUS | Status: AC
  Administered 2019-10-02 – 2019-10-03 (×3): 0.15 ug/kg/min via INTRAVENOUS
  Filled 2019-10-02 (×3): qty 100

## 2019-10-02 MED ORDER — AMIODARONE HCL IN DEXTROSE 360-4.14 MG/200ML-% IV SOLN
60.0000 mg/h | INTRAVENOUS | Status: AC
  Administered 2019-10-02 (×2): 60 mg/h via INTRAVENOUS
  Filled 2019-10-02 (×2): qty 200

## 2019-10-02 MED ORDER — SODIUM CHLORIDE 0.9 % IV SOLN: 250.0000 mL | INTRAVENOUS | Status: DC | PRN

## 2019-10-02 MED ORDER — TIROFIBAN (AGGRASTAT) BOLUS VIA INFUSION
INTRAVENOUS | Status: DC | PRN
  Administered 2019-10-02: 2345 ug via INTRAVENOUS

## 2019-10-02 MED ORDER — ORAL CARE MOUTH RINSE
15.0000 mL | Freq: Two times a day (BID) | OROMUCOSAL | Status: DC
  Administered 2019-10-02 – 2019-10-05 (×4): 15 mL via OROMUCOSAL

## 2019-10-02 MED ORDER — ATORVASTATIN CALCIUM 80 MG PO TABS: 80.0000 mg | ORAL_TABLET | Freq: Every day | ORAL | Status: DC

## 2019-10-02 MED ORDER — IOHEXOL 350 MG/ML SOLN
INTRAVENOUS | Status: DC | PRN
  Administered 2019-10-02: 140 mL

## 2019-10-02 MED ORDER — HEPARIN (PORCINE) IN NACL 1000-0.9 UT/500ML-% IV SOLN
INTRAVENOUS | Status: DC | PRN
  Administered 2019-10-02 (×2): 500 mL

## 2019-10-02 SURGICAL SUPPLY — 21 items
BALLN SAPPHIRE 2.0X12 (BALLOONS) ×2
BALLN ~~LOC~~ EUPHORA RX 3.25X15 (BALLOONS) ×2
BALLOON SAPPHIRE 2.0X12 (BALLOONS) IMPLANT
BALLOON ~~LOC~~ EUPHORA RX 3.25X15 (BALLOONS) IMPLANT
CATH EXTRAC PRONTO 5.5F 138CM (CATHETERS) ×1 IMPLANT
CATH INFINITI 5FR ANG PIGTAIL (CATHETERS) ×1 IMPLANT
CATH OPTITORQUE TIG 4.0 5F (CATHETERS) ×1 IMPLANT
CATH VISTA GUIDE 6FR XBLAD3.5 (CATHETERS) ×1 IMPLANT
DEVICE RAD COMP TR BAND LRG (VASCULAR PRODUCTS) ×1 IMPLANT
GLIDESHEATH SLEND A-KIT 6F 22G (SHEATH) ×1 IMPLANT
GUIDEWIRE INQWIRE 1.5J.035X260 (WIRE) IMPLANT
INQWIRE 1.5J .035X260CM (WIRE) ×2
KIT ENCORE 26 ADVANTAGE (KITS) ×1 IMPLANT
KIT HEART LEFT (KITS) ×2 IMPLANT
PACK CARDIAC CATHETERIZATION (CUSTOM PROCEDURE TRAY) ×2 IMPLANT
STENT SYNERGY XD 3.0X20 (Permanent Stent) IMPLANT
SYNERGY XD 3.0X20 (Permanent Stent) ×2 IMPLANT
TRANSDUCER W/STOPCOCK (MISCELLANEOUS) ×2 IMPLANT
TUBING CIL FLEX 10 FLL-RA (TUBING) ×2 IMPLANT
WIRE ASAHI PROWATER 180CM (WIRE) ×1 IMPLANT
WIRE HI TORQ VERSACORE-J 145CM (WIRE) ×1 IMPLANT

## 2019-10-02 NOTE — Interval H&P Note (Signed)
Cath Lab Visit (complete for each Cath Lab visit)  Clinical Evaluation Leading to the Procedure:   ACS: Yes.    Non-ACS:    Anginal Classification: CCS III  Anti-ischemic medical therapy: No Therapy  Non-Invasive Test Results: No non-invasive testing performed  Prior CABG: No previous CABG      History and Physical Interval Note:  10/02/2019 10:11 AM  Edward Pierce  has presented today for surgery, with the diagnosis of chest pain.  The various methods of treatment have been discussed with the patient and family. After consideration of risks, benefits and other options for treatment, the patient has consented to  Procedure(s): LEFT HEART CATH AND CORONARY ANGIOGRAPHY (N/A) as a surgical intervention.  The patient's history has been reviewed, patient examined, no change in status, stable for surgery.  I have reviewed the patient's chart and labs.  Questions were answered to the patient's satisfaction.     Quay Burow

## 2019-10-02 NOTE — Progress Notes (Signed)
[facility] for Heparin Indication: chest pain/ACS  No Known Allergies  Patient Measurements: Height: 5\' 11"  (180.3 cm) Weight: 93.8 kg (206 lb 12.7 oz) IBW/kg (Calculated) : 75.3  Wt 93.8 kg kg Ht 72 in IBW: 77.6 kg Heparin Dosing Weight: 94 kg  Vital Signs: Temp: 98.2 F (36.8 C) (07/21 0421) Temp Source: Oral (07/21 0421) BP: 138/101 (07/21 0630) Pulse Rate: 64 (07/21 0630)  Labs: Recent Labs    10/01/19 2232 10/02/19 0016 10/02/19 0551  HGB 16.3  --   --   HCT 48.3  --   --   PLT 218  --   --   HEPARINUNFRC  --   --  0.64  CREATININE 0.91  --   --   TROPONINIHS 185* 665*  --     Estimated Creatinine Clearance: 108.6 mL/min (by C-G formula based on SCr of 0.91 mg/dL).   Medical History: Past Medical History:  Diagnosis Date  . Hyperlipidemia     Assessment: 54 y.o. M presents with CP on heparin for ACS. No AC PTA. Plans noted for cath today -heparin level at goal  -CBC stable  Goal of Therapy:  Heparin level 0.3-0.7 units/ml Monitor platelets by anticoagulation protocol: Yes   Plan:  -Continue heparin at 1300 units/hr -Will follow plans post cath  Hildred Laser, PharmD Clinical Pharmacist **Pharmacist phone directory can now be found on [email or web address] ([email or web address]).  Listed under [place].

## 2019-10-02 NOTE — H&P (Signed)
History and Physical    Edward Pierce NUU:[id number] DOB: Dec 02, 1965 DOA: 10/01/2019  PCP: Lorrene Reid, PA-C  Patient coming from: Home  I have personally briefly reviewed patient's old medical records in [place]  Chief Complaint: CP  HPI: Edward Pierce is a 54 y.o. male with medical history significant of HLD.  Pt presents to ED with c/o CP.  Symptoms onset while mowing his lawn at 7:30 PM.  CP associated with nausea and dyspnea.  Pain 5/10 initially.  Now 3/10 on arrival to ED.   ED Course: Pain resolved after NGT SL and NTG paste.  BP has trended up to 170/100 (despite nitro).  EKG shows non-specific T wave findings in inferior leads.  Trop is 185!  Cards is consulted.   Review of Systems: As per HPI, otherwise all review of systems negative.  Past Medical History:  Diagnosis Date   Hyperlipidemia     Past Surgical History:  Procedure Laterality Date   GUM SURGERY  1979   gum graft   HERNIA REPAIR     right side     reports that he quit smoking about 23 years ago. His smoking use included cigarettes. He has a 3.25 pack-year smoking history. He has never used smokeless tobacco. He reports current alcohol use of about 3.0 standard drinks of alcohol per week. He reports that he does not use drugs.  No Known Allergies  Family History  Problem Relation Age of Onset   Cancer Mother        breast   Diabetes Mother    Hypertension Mother    Heart disease Mother        CAD   Breast cancer Mother    Hyperlipidemia Father    COPD Father    Heart disease Father        CAD   Heart attack Paternal Grandfather      Prior to Admission medications   Medication Sig Start Date End Date Taking? Authorizing Provider  ibuprofen (ADVIL) 200 MG tablet Take 600-800 mg by mouth 2 (two) times daily as needed for headache or moderate pain.   Yes [provider]  loratadine (CLARITIN) 10 MG tablet Take 10 mg by mouth daily as needed for  allergies.   Yes [provider]  atorvastatin (LIPITOR) 40 MG tablet TAKE 1 TABLET (40 MG TOTAL) BY MOUTH AT BEDTIME. PATIENT NEEDS OFFICE VISIT FOR FURTHER REFILLS Patient not taking: Reported on 10/02/2019 04/06/18   Mellody Dance, DO    Physical Exam: Vitals:   10/01/19 2228 10/01/19 2355 10/02/19 0030 10/02/19 0115  BP: (!) 159/105 (!) 169/95 (!) 149/97 (!) 169/103  Pulse: 70 60 76 (!) 59  Resp: 16 18 (!) 22 11  Temp: 98.2 F (36.8 C) 98.7 F (37.1 C)    TempSrc: Oral Oral    SpO2: 98% 98% 95% 98%    Constitutional: NAD, calm, comfortable Eyes: PERRL, lids and conjunctivae normal ENMT: Mucous membranes are moist. Posterior pharynx clear of any exudate or lesions.Normal dentition.  Neck: normal, supple, no masses, no thyromegaly Respiratory: clear to auscultation bilaterally, no wheezing, no crackles. Normal respiratory effort. No accessory muscle use.  Cardiovascular: Regular rate and rhythm, no murmurs / rubs / gallops. No extremity edema. 2+ pedal pulses. No carotid bruits.  Abdomen: no tenderness, no masses palpated. No hepatosplenomegaly. Bowel sounds positive.  Musculoskeletal: no clubbing / cyanosis. No joint deformity upper and lower extremities. Good ROM, no contractures. Normal muscle tone.  Skin:  no rashes, lesions, ulcers. No induration Neurologic: CN 2-12 grossly intact. Sensation intact, DTR normal. Strength 5/5 in all 4.  Psychiatric: Normal judgment and insight. Alert and oriented x 3. Normal mood.    Labs on Admission: I have personally reviewed following labs and imaging studies  CBC: Recent Labs  Lab 10/01/19 2232  WBC 9.6  HGB 16.3  HCT 48.3  MCV 88.3  PLT 595   Basic Metabolic Panel: Recent Labs  Lab 10/01/19 2232  NA 136  K 3.8  CL 101  CO2 25  GLUCOSE 125*  BUN 10  CREATININE 0.91  CALCIUM 10.0   GFR: CrCl cannot be calculated (Unknown ideal weight.). Liver Function Tests: No results for input(s): AST, ALT, ALKPHOS,  BILITOT, PROT, ALBUMIN in the last 168 hours. No results for input(s): LIPASE, AMYLASE in the last 168 hours. No results for input(s): AMMONIA in the last 168 hours. Coagulation Profile: No results for input(s): INR, PROTIME in the last 168 hours. Cardiac Enzymes: No results for input(s): CKTOTAL, CKMB, CKMBINDEX, TROPONINI in the last 168 hours. BNP (last 3 results) No results for input(s): PROBNP in the last 8760 hours. HbA1C: No results for input(s): HGBA1C in the last 72 hours. CBG: No results for input(s): GLUCAP in the last 168 hours. Lipid Profile: No results for input(s): CHOL, HDL, LDLCALC, TRIG, CHOLHDL, LDLDIRECT in the last 72 hours. Thyroid Function Tests: No results for input(s): TSH, T4TOTAL, FREET4, T3FREE, THYROIDAB in the last 72 hours. Anemia Panel: No results for input(s): VITAMINB12, FOLATE, FERRITIN, TIBC, IRON, RETICCTPCT in the last 72 hours. Urine analysis: No results found for: COLORURINE, APPEARANCEUR, LABSPEC, PHURINE, GLUCOSEU, HGBUR, BILIRUBINUR, KETONESUR, PROTEINUR, UROBILINOGEN, NITRITE, LEUKOCYTESUR  Radiological Exams on Admission: DG Chest 2 View  Result Date: 10/01/2019 CLINICAL DATA:  Chest pain EXAM: CHEST - 2 VIEW COMPARISON:  None. FINDINGS: The heart size and mediastinal contours are within normal limits. Both lungs are clear. Mild degenerative changes of the spine. IMPRESSION: No active cardiopulmonary disease. Electronically Signed   By: Donavan Foil M.D.   On: 10/01/2019 22:45    EKG: Independently reviewed.  Assessment/Plan Principal Problem:   NSTEMI (non-ST elevated myocardial infarction) ([place]) Active Problems:   Hyperlipemia, mixed   Elevated blood pressure reading without diagnosis of hypertension    1. NSTEMI - 1. ACS pathway 2. Heparin gtt 3. NGT ointment 4. ASA 5. Cards consulted 6. NPO 7. Presumably has a LHC in his near future 8. Tele monitor 2. HLD - 1. Cont / resume Statin - looks like he might not have been  taking it 2. FLP pending 3. ? HTN - 1. HR 59, so didn't want to start BB 2. Cards recd starting Captopril instead  DVT prophylaxis: Heparin gtt Code Status: Full Family Communication: Wife at bedside Disposition Plan: Home after admit Consults called: Cards Admission status: Admit to inpatient  Severity of Illness: The appropriate patient status for this patient is INPATIENT. Inpatient status is judged to be reasonable and necessary in order to provide the required intensity of service to ensure the patient's safety. The patient's presenting symptoms, physical exam findings, and initial radiographic and laboratory data in the context of their chronic comorbidities is felt to place them at high risk for further clinical deterioration. Furthermore, it is not anticipated that the patient will be medically stable for discharge from the hospital within 2 midnights of admission. The following factors support the patient status of inpatient.   IP status for NSTEMI.   * I certify  that at the point of admission it is my clinical judgment that the patient will require inpatient hospital care spanning beyond 2 midnights from the point of admission due to high intensity of service, high risk for further deterioration and high frequency of surveillance required.*    Mahitha Hickling M. DO [facility]  How to contact the [facility] Attending or Consulting provider [place] or covering provider during after hours [place], for this patient?  1. Check the care team in [facility] and look for a) attending/consulting [facility] provider listed and b) the [facility] team listed 2. Log into [email or web address]  Amion Physician Scheduling and messaging for groups and whole hospitals  On call and physician scheduling software for group practices, residents, hospitalists and other medical providers for call, clinic, rotation and shift schedules. On[employer] is a hospital-wide system for scheduling doctors and paging doctors on call.  EasyPlot is for scientific plotting and data analysis.  [email or web address]  and use [place]'s universal password to access. If you do not have the password, please contact the hospital operator.  3. Locate the [facility] provider you are looking for under [facility] and page to a number that you can be directly reached. 4. If you still have difficulty reaching the provider, please page the [facility] (Director on Call) for the Hospitalists listed on amion for assistance.  10/02/2019, 1:32 AM

## 2019-10-02 NOTE — Plan of Care (Signed)
  Problem: Education: Goal: Knowledge of General Education information will improve Description: Including pain rating scale, medication(s)/side effects and non-pharmacologic comfort measures Outcome: Progressing   Problem: Health Behavior/Discharge Planning: Goal: Ability to manage health-related needs will improve Outcome: Progressing   Problem: Clinical Measurements: Goal: Will remain free from infection Outcome: Progressing Goal: Respiratory complications will improve Outcome: Progressing   Problem: Activity: Goal: Risk for activity intolerance will decrease Outcome: Progressing   Problem: Nutrition: Goal: Adequate nutrition will be maintained Outcome: Progressing   Problem: Coping: Goal: Level of anxiety will decrease Outcome: Progressing   Problem: Elimination: Goal: Will not experience complications related to bowel motility Outcome: Progressing Goal: Will not experience complications related to urinary retention Outcome: Progressing   Problem: Pain Managment: Goal: General experience of comfort will improve Outcome: Progressing   Problem: Safety: Goal: Ability to remain free from injury will improve Outcome: Progressing   Problem: Skin Integrity: Goal: Risk for impaired skin integrity will decrease Outcome: Progressing   Problem: Education: Goal: Understanding of cardiac disease, CV risk reduction, and recovery process will improve Outcome: Progressing Goal: Understanding of medication regimen will improve Outcome: Progressing Goal: Individualized Educational Video(s) Outcome: Progressing   Problem: Activity: Goal: Ability to tolerate increased activity will improve Outcome: Progressing   Problem: Health Behavior/Discharge Planning: Goal: Ability to safely manage health-related needs after discharge will improve Outcome: Progressing   Problem: Clinical Measurements: Goal: Ability to maintain clinical measurements within normal limits will  improve Outcome: Not Progressing Goal: Diagnostic test results will improve Outcome: Not Progressing Goal: Cardiovascular complication will be avoided Outcome: Not Progressing   Problem: Cardiac: Goal: Ability to achieve and maintain adequate cardiopulmonary perfusion will improve Outcome: Not Progressing

## 2019-10-02 NOTE — Progress Notes (Addendum)
Patient admitted earlier this morning with NSTEMI after presenting with chest discomfort pushing mowing his yard. Initial EKG showed normal sinus rhythm with mild T wave inversion in lateral leads. High-sensitivity troponin elevated at 185 >> 665. Repeat pending. In the ED, he was also noted to have frequent runs accelerated idioventricular rhythm as well as some non-sustained VT. He was started on IV Heparin and IV Amiodarone with bolus. Echo was ordered.   He has continued to have runs of AIVR and NSVT overnight. Longest run of AIVR seems to be 37 seconds. He will have lungs runs of AIVR and then a few normal beats and then runs of AIVR. Rates in the 60's to 80's. Longest run of NSVT about 8 beats. Ectopy seems to have calmed down a little right now.    Went by to check on patient this morning. He is resting comfortably. No recurrent chest pain on Ntiro patch. No shortness of breath. He notes some chest fluttering with arrhythmia but no lightheadedness or dizziness.   General: 54 y.o. male resting comfortably in no acute distress. HEENT: Normocephalic and atraumatic. Sclera clear.  Neck: Supple. No carotid bruits. No JVD. Heart: RRR. Distinct S1 and S2. No murmurs, gallops, or rubs. Radial and distal pedal pulses 2+ and equal bilaterally. Lungs: No increased work of breathing. Clear to ausculation bilaterally. No wheezes, rhonchi, or rales.  Abdomen: Soft, non-distended, and non-tender to palpation.  Extremities: No lower extremity edema.    Skin: Warm and dry. Neuro: No focal deficits. Psych: Normal affect. Responds appropriately.  Plan: NSTEMI/ NSVT/ AIVR - Continue IV Heparin and IV Amiodarone.-Post-cath anticipate weaning off of amiodarone and the beta-blocker if PCI done - Magnesium 1.8 this morning. Will supplement. Goal >2.0. - Potassium 3.8 last night . Will supplement. Goal >4.0. - Echo has been ordered. - Suspect patient will need left heart catheterization today. The patient  understands that risks include but are not limited to stroke (1 in 1000), death (1 in 71), kidney failure [usually temporary] (1 in 500), bleeding (1 in 200), allergic reaction [possibly serious] (1 in 200), and agrees to proceed.   Hypertension - BP still elevated but improved some from presentation. Diastolic BP still elevated.  - Started on Captopril 12.5mg  daily on admission. Will continue for now but will likely consolidate after cath. - Consider adding low dose Coreg 3.125mg  twice daily -> once off amiodarone  Hyperlipidemia - Lipid panel this morning: Total Cholesterol 323, Triglycerides 177, HDL 47, LDL 241.  - Will increase home Lipitor to 80 mg daily - Depending on cath results, may need to consider PCSK9 inhibitor as outpatient.  Darreld Mclean, PA-C 10/02/2019 7:15 AM   ATTENDING ATTESTATION  I have seen, examined and evaluated the patient this AM in the CVICU along with Sande Rives, PA.  After reviewing all the available data and chart, we discussed the patients laboratory, study & physical findings as well as symptoms in detail. I agree with her findings, examination as well as impression recommendations as per our discussion.    Attending adjustments noted in italics.   Taeshawn seems to be relatively comfortable this morning.  He says that his pain/discomfort in his chest started going away with the medicines they gave him when he got into his emergency room bed last night.  He had pain that initially went away while waiting in the ER waiting room and then it returned getting worse.  He has not had any that symptoms since. When I asked  him about any palpitation sensations would be a RVR and NSVT last night, he indicated that he just felt a nervous twitch over the night and thought maybe that he was just anxious because of having a heart attack.  He denies any lightheadedness or dizziness.  No nausea or vomiting.  EKG is very concerning with wide QRS complex and  intermittent AI VR would not stay VT. I agree with the admitting fellow that this is very concerning for possible reperfusion arrhythmia of an occluded vessel which would go along with him having significant reduction in pain.  I still think he probably has high-grade disease which could likely be LAD versus multivessel.  With him not having active ongoing chest pain, I think we can wait until second case, but he needs to go urgently upon completion of the first cases and not wait till later this morning.  Interesting that he is on captopril.  I do agree that we would probably want to switch to a longer acting ACE inhibitor (not the best option with pending cath), will also likely consider beta-blocker and wean off amiodarone pending findings.  He is does not seem to have had any further arrhythmia since starting amiodarone.   Performing MD:  [facility]  Procedure:  Left Heart Catheterization with Coronary Angiography & possible Percutaneous Coronary Intervention.  The procedure with Risks/Benefits/Alternatives and Indications was reviewed with the patient.  All questions were answered.    Risks / Complications include, but not limited to: Death, MI, CVA/TIA, VF/VT (with defibrillation), Bradycardia (need for temporary pacer placement), contrast induced nephropathy, bleeding / bruising / hematoma / pseudoaneurysm, vascular or coronary injury (with possible emergent CT or Vascular Surgery), adverse medication reactions, infection.  Additional risks involving the use of radiation with the possibility of radiation burns and cancer were explained in detail.  The patient voices understanding and agree to proceed.      Glenetta Hew, MD      Glenetta Hew, M.D., M.S. Interventional Cardiologist   Pager # [phone] Phone # [phone] [address] [place], [place] [address]

## 2019-10-02 NOTE — Progress Notes (Signed)
ANTICOAGULATION CONSULT NOTE - Initial Consult  Pharmacy Consult for Heparin Indication: chest pain/ACS  No Known Allergies  Patient Measurements:    Wt 93.8 kg kg Ht 72 in IBW: 77.6 kg Heparin Dosing Weight: 94 kg  Vital Signs: Temp: 98.7 F (37.1 C) (07/20 2355) Temp Source: Oral (07/20 2355) BP: 169/95 (07/20 2355) Pulse Rate: 60 (07/20 2355)  Labs: Recent Labs    10/01/19 2232  HGB 16.3  HCT 48.3  PLT 218  CREATININE 0.91  TROPONINIHS 185*    CrCl cannot be calculated (Unknown ideal weight.).   Medical History: Past Medical History:  Diagnosis Date  . Hyperlipidemia     Medications:  See electronic med rec  Assessment: 54 y.o. M presents with CP. To begin heparin for ACS. No AC PTA. CBC ok on admission.  Goal of Therapy:  Heparin level 0.3-0.7 units/ml Monitor platelets by anticoagulation protocol: Yes   Plan:  Heparin IV bolus 4000 units Heparin gtt at 1300 units/hr Will f/u heparin level in 6 hours Daily heparin level and CBC  Sherlon Handing, PharmD, BCPS Please see amion for complete clinical pharmacist phone list 10/02/2019,12:23 AM

## 2019-10-02 NOTE — Progress Notes (Signed)
  Echocardiogram 2D Echocardiogram has been performed.  Edward Pierce 10/02/2019, 8:57 AM

## 2019-10-02 NOTE — ED Notes (Signed)
Cardiology at bedside.

## 2019-10-02 NOTE — ED Provider Notes (Addendum)
[facility] EMERGENCY DEPARTMENT Provider Note   CSN: [id number] Arrival date & time: 10/01/19  2218   History Chief Complaint  Patient presents with  . Chest Pain    Edward Pierce is a 54 y.o. male.  The history is provided by the patient.  Chest Pain He has history of hypertension, hyperlipidemia and comes in because of an episode of chest discomfort.  He was mowing his lawn at about 7:30 PM when he noted an indigestion feeling in the lower sternal area and upper abdomen.  There is no radiation of discomfort.  He rated it at 5/10.  There was some associated dyspnea and nausea.  There was diaphoresis, but he had been working outside in heat.  Discomfort has subsided to it is now down to 3/10.  Nothing made it better, nothing made it worse.  He did try taking an antacid which made him belch and there was minimal, temporary improvement.  He had had similar complaints once before, also while mowing his lawn.  However, on that occasion, he was on a riding mower.  He is a former smoker.  There is no history of diabetes.  There is no family history of premature coronary atherosclerosis.  Past Medical History:  Diagnosis Date  . Hyperlipidemia     Patient Active Problem List   Diagnosis Date Noted  . Colon cancer screening 07/18/2016  . Elevated blood pressure reading without diagnosis of hypertension 03/02/2016  . Vitamin D deficiency 01/23/2016  . Excessive drinking of alcohol at times 01/23/2016  . Hypertriglyceridemia, familial 01/19/2016  . Hyperlipemia, mixed 01/19/2016  . Low HDL (under 40) 01/19/2016  . Overweight (BMI 25.0-29.9) 12/29/2015  . Periodic heart flutter 12/29/2015    Past Surgical History:  Procedure Laterality Date  . GUM SURGERY  1979   gum graft  . HERNIA REPAIR     right side       Family History  Problem Relation Age of Onset  . Cancer Mother        breast  . Diabetes Mother   . Hypertension Mother   . Heart disease Mother     . Breast cancer Mother   . Hyperlipidemia Father   . COPD Father   . Heart attack Paternal Grandfather     Social History   Tobacco Use  . Smoking status: Former Smoker    Packs/day: 0.25    Years: 13.00    Pack years: 3.25    Types: Cigarettes    Quit date: 02/12/1996    Years since quitting: 23.6  . Smokeless tobacco: Never Used  Substance Use Topics  . Alcohol use: Yes    Alcohol/week: 3.0 standard drinks    Types: 1 Glasses of wine, 2 Cans of beer per week  . Drug use: No    Home Medications Prior to Admission medications   Medication Sig Start Date End Date Taking? Authorizing Provider  atorvastatin (LIPITOR) 40 MG tablet TAKE 1 TABLET (40 MG TOTAL) BY MOUTH AT BEDTIME. PATIENT NEEDS OFFICE VISIT FOR FURTHER REFILLS 04/06/18   Mellody Dance, DO  cholecalciferol (VITAMIN D) 1000 units tablet Take 5,000 Units by mouth daily.    [provider]  Multiple Vitamin (MULTIVITAMIN) tablet Take 1 tablet by mouth once a week.    [provider]  Omega-3 Fatty Acids (FISH OIL) 500 MG CAPS Take 2 capsules by mouth daily.     [provider]    Allergies    Patient has  no known allergies.  Review of Systems   Review of Systems  Cardiovascular: Positive for chest pain.  All other systems reviewed and are negative.   Physical Exam Updated Vital Signs BP (!) 169/95   Pulse 60   Temp 98.7 F (37.1 C) (Oral)   Resp 18   SpO2 98%   Physical Exam Vitals and nursing note reviewed.   53 year old male, resting comfortably and in no acute distress. Vital signs are significant for elevated blood pressure. Oxygen saturation is 98%, which is normal. Head is normocephalic and atraumatic. PERRLA, EOMI. Oropharynx is clear. Neck is nontender and supple without adenopathy or JVD. Back is nontender and there is no CVA tenderness. Lungs are clear without rales, wheezes, or rhonchi. Chest is nontender. Heart has regular rate and rhythm without  murmur. Abdomen is soft, flat, nontender without masses or hepatosplenomegaly and peristalsis is normoactive. Extremities have no cyanosis or edema, full range of motion is present. Skin is warm and dry without rash. Neurologic: Mental status is normal, cranial nerves are intact, there are no motor or sensory deficits.  ED Results / Procedures / Treatments   Labs (all labs ordered are listed, but only abnormal results are displayed) Labs Reviewed  BASIC METABOLIC PANEL - Abnormal; Notable for the following components:      Result Value   Glucose, Bld 125 (*)    All other components within normal limits  TROPONIN I (HIGH SENSITIVITY) - Abnormal; Notable for the following components:   Troponin I (High Sensitivity) 185 (*)    All other components within normal limits  CBC  TROPONIN I (HIGH SENSITIVITY)    EKG EKG Interpretation  Date/Time:  Tuesday October 01 2019 22:18:16 EDT Ventricular Rate:  67 PR Interval:  144 QRS Duration: 74 QT Interval:  372 QTC Calculation: 393 R Axis:   86 Text Interpretation: Normal sinus rhythm Anterior infarct , age undetermined Abnormal ECG Nonspecific T wave abnormality Inferior leads No old tracing to compare Confirmed by Delora Fuel ([id number]) on 10/02/2019 12:13:01 AM   Radiology DG Chest 2 View  Result Date: 10/01/2019 CLINICAL DATA:  Chest pain EXAM: CHEST - 2 VIEW COMPARISON:  None. FINDINGS: The heart size and mediastinal contours are within normal limits. Both lungs are clear. Mild degenerative changes of the spine. IMPRESSION: No active cardiopulmonary disease. Electronically Signed   By: Donavan Foil M.D.   On: 10/01/2019 22:45    Procedures Procedures  CRITICAL CARE Performed by: Delora Fuel Total critical care time: 85 minutes Critical care time was exclusive of separately billable procedures and treating other patients. Critical care was necessary to treat or prevent imminent or life-threatening deterioration. Critical care was  time spent personally by me on the following activities: development of treatment plan with patient and/or surrogate as well as nursing, discussions with consultants, evaluation of patient's response to treatment, examination of patient, obtaining history from patient or surrogate, ordering and performing treatments and interventions, ordering and review of laboratory studies, ordering and review of radiographic studies, pulse oximetry and re-evaluation of patient's condition.  Medications Ordered in ED Medications  sodium chloride flush (NS) 0.9 % injection 3 mL (has no administration in time range)    ED Course  I have reviewed the triage vital signs and the nursing notes.  Pertinent labs & imaging results that were available during my care of the patient were reviewed by me and considered in my medical decision making (see chart for details).  MDM Rules/Calculators/A&P Chest discomfort  worrisome for coronary disease.  ECG shows some minor T wave flattening in the inferior leads.  Initial troponin is moderately elevated at 185, consistent with non-STEMI.  Chest x-ray shows no acute process.  He is given aspirin and started on heparin and is also given a trial of nitroglycerin.  Old records are reviewed, and he has no relevant past visits.  12:43 AM Following nitroglycerin, he is completely pain-free.  Nitroglycerin ointment is applied.  Case is discussed with Dr. Alcario Drought of [facility], who agrees admit the patient.  Case also discussed with Dr. Vickki Muff of cardiology service who agrees to see the patient in consultation.  Final Clinical Impression(s) / ED Diagnoses Final diagnoses:  Non-STEMI (non-ST elevated myocardial infarction) ([place])  Elevated blood pressure reading with diagnosis of hypertension    Rx / DC Orders ED Discharge Orders    None       Delora Fuel, MD 03/22/30 0119  Second troponin has increased to 665.  ECG was noted to develop new right bundle branch block.   I have talked with the cardiology fellow, Dr. Vickki Muff, to notify him of this change in ECG.  ECG is still not showing STEMI.   EKG Interpretation  Date/Time:  Wednesday October 02 2019 01:41:29 EDT Ventricular Rate:  62 PR Interval:  144 QRS Duration: 77 QT Interval:  373 QTC Calculation: 379 R Axis:   87 Text Interpretation: Sinus rhythm Probable anteroseptal infarct, old Abnormal T, consider ischemia, lateral leads When compared with ECG of 10/01/2019, T wave inversion Lateral leads is now present Confirmed by Delora Fuel ([id number]) on 10/02/2019 1:46:22 AM       EKG Interpretation  Date/Time:  Wednesday October 02 2019 01:51:36 EDT Ventricular Rate:  100 PR Interval:  144 QRS Duration: 137 QT Interval:  400 QTC Calculation: 516 R Axis:   170 Text Interpretation: Sinus tachycardia Sinus pause Right bundle branch block Lateral infarct, recent Probable anteroseptal infarct, recent Minimal ST elevation, inferior leads Baseline wander in lead(s) V2 When compared with ECG of EARLIER SAME DATE Right bundle branch block is now present Confirmed by Delora Fuel ([id number]) on 10/02/2019 2:12:02 AM       EKG Interpretation  Date/Time:  Wednesday October 02 2019 01:52:34 EDT Ventricular Rate:  99 PR Interval:  144 QRS Duration: 134 QT Interval:  397 QTC Calculation: 510 R Axis:   171 Text Interpretation: Sinus rhythm Ventricular premature complex Prolonged PR interval Right bundle branch block Anterolateral infarct, age indeterminate Minimal ST elevation, inferior leads When compared with ECG of EARLIER SAME DATE Premature ventricular complexes are now present Confirmed by Delora Fuel ([id number]) on 10/02/2019 2:13:14 AM       EKG Interpretation  Date/Time:  Wednesday October 02 2019 01:52:58 EDT Ventricular Rate:  102 PR Interval:  144 QRS Duration: 135 QT Interval:  397 QTC Calculation: 518 R Axis:   172 Text Interpretation: Junctional tachycardia Right bundle branch block Anterolateral infarct, age  indeterminate Minimal ST elevation, inferior leads When compared with ECG of EARLIER SAME DATE Premature ventricular complexes are no longer present Confirmed by Delora Fuel ([id number]) on 10/02/2019 2:14:25 AM       EKG Interpretation  Date/Time:  Wednesday October 02 2019 02:01:49 EDT Ventricular Rate:  118 PR Interval:  144 QRS Duration: 71 QT Interval:  338 QTC Calculation: 421 R Axis:   96 Text Interpretation: Sinus tachycardia Paired ventricular premature complexes Probable anteroseptal infarct, recent When compared with ECG of EARLIER SAME DATE Premature ventricular complexes are  now present Right bundle branch block is intermittent Confirmed by Delora Fuel ([id number]) on 10/02/2019 1:84:85 AM            Delora Fuel, MD 92/76/39 [phone]

## 2019-10-02 NOTE — ED Notes (Signed)
RN notice pt is having EKG rhythm changes. Pt was initially NSR, now appears to be in a BBB. Pt is not having CP, but is complaining of a fluttering feeling. MD Roxanne Mins has called cardiology, and MD Alcario Drought has been made aware of the pt change.

## 2019-10-02 NOTE — ED Notes (Signed)
Cardiology paged to Dr. Roxanne Mins

## 2019-10-02 NOTE — H&P (Addendum)
CARDIOLOGY ADMISSION NOTE  Patient ID: Edward Pierce MRN: [id number] DOB/AGE: Jul 22, 1965 54 y.o.  Admit date: 10/01/2019 Primary Cardiologist None Chief Complaint  NSTEMI Requesting ED   HPI:   Edward Pierce is a 54 year old gentleman with HLD, hypertriglyceridemia, HTN (not previously on antihypertensives), and remote tobacco use history (quit over 20 years ago) who presented to the ED for evaluation of chest comfort and elevated blood pressures.  Patient reports that he was in his usual state of health until the evening of presentation around 7:30 PM.  He states that he was pushing his mower when he suddenly developed chest discomfort. He states that the sensation was not really no pain or pressure, but describes it more as indigestion and a sensation of needing to belch.  Reports that he had a similar episode several months ago while he was on his riding mower that subsided after about 15 minutes.  He attempted that again this evening, but found that even after extended period of time that his symptoms remained.  He hecked his blood pressure at this time and  found that his blood pressures were significantly elevated. At the time, patient states that he was diaphoretic but attributed this to being in bed outside where he was working.  Patient denies any other accompanying symptoms such as shortness of breath/dyspnea on exertion, lightheadedness/dizziness, or nausea/vomiting.  Denies radiation of his chest discomfort to his neck or arms. Together, the chest discomfort and elevated blood pressures prompted him to come to the ED for further evaluation.   In the ED, initial vital signs showed the patient was afebrile with a blood pressure of 159/105, heart rate of 70, satting 90% on room air.  Initial ECG showed normal sinus rhythm without dynamic ST changes.  Troponin of 665.  ECGs were believed by the ED to show intermittent RBBB.  However on closer personal inspection, his ECGs appeared to be slow  NSVT/AIVR with occasional PVCs from a different focus.  Evaluation of the patient, he was chest pain-free after nitroglycerin or nitroglycerin patch.  He has no other complaints at this time other than occasional chest fluttering.  Labs in the ED for remarkable for a high-sensitivity troponin of 185 initially ---> 665.   BMP, CBC, and A1c are within normal limits.  He was started on a heparin gtt and started on captopril for his hypertension. He also received a bolus and drip of amiodarone for his VT.     Past Medical History:  Diagnosis Date  . Hyperlipidemia     Past Surgical History:  Procedure Laterality Date  . GUM SURGERY  1979   gum graft  . HERNIA REPAIR     right side    No Known Allergies (Not in a hospital admission)  Family History  Problem Relation Age of Onset  . Cancer Mother        breast  . Diabetes Mother   . Hypertension Mother   . Heart disease Mother        CAD  . Breast cancer Mother   . Hyperlipidemia Father   . COPD Father   . Heart disease Father        CAD  . Heart attack Paternal Grandfather     Social History   Socioeconomic History  . Marital status: Married    Spouse name: Not on file  . Number of children: Not on file  . Years of education: Not on file  . Highest education level: Not  on file  Occupational History  . Not on file  Tobacco Use  . Smoking status: Former Smoker    Packs/day: 0.25    Years: 13.00    Pack years: 3.25    Types: Cigarettes    Quit date: 02/12/1996    Years since quitting: 23.6  . Smokeless tobacco: Never Used  Substance and Sexual Activity  . Alcohol use: Yes    Alcohol/week: 3.0 standard drinks    Types: 1 Glasses of wine, 2 Cans of beer per week  . Drug use: No  . Sexual activity: Yes    Birth control/protection: None  Other Topics Concern  . Not on file  Social History Narrative  . Not on file   Social Determinants of Health   Financial Resource Strain:   . Difficulty of Paying Living  Expenses:   Food Insecurity:   . Worried About Charity fundraiser in the Last Year:   . Arboriculturist in the Last Year:   Transportation Needs:   . Film/video editor (Medical):   Marland Kitchen Lack of Transportation (Non-Medical):   Physical Activity:   . Days of Exercise per Week:   . Minutes of Exercise per Session:   Stress:   . Feeling of Stress :   Social Connections:   . Frequency of Communication with Friends and Family:   . Frequency of Social Gatherings with Friends and Family:   . Attends Religious Services:   . Active Member of Clubs or Organizations:   . Attends Archivist Meetings:   Marland Kitchen Marital Status:   Intimate Partner Violence:   . Fear of Current or Ex-Partner:   . Emotionally Abused:   Marland Kitchen Physically Abused:   . Sexually Abused:      Review of Systems: [y] = yes, [ ]  = no       General: Weight gain [ ] ; Weight loss [ ] ; Anorexia [ ] ; Fatigue [ ] ; Fever [ ] ; Chills [ ] ; Weakness [ ]     Cardiac: As reported in HPI  Pulmonary: Cough [ ] ; Wheezing[ ] ; Hemoptysis[ ] ; Sputum [ ] ; Snoring [ ]     GI: Vomiting[ ] ; Dysphagia[ ] ; Melena[ ] ; Hematochezia [ ] ; Heartburn[ ] ; Abdominal pain [ ] ; Constipation [ ] ; Diarrhea [ ] ; BRBPR [ ]     GU: Hematuria[ ] ; Dysuria [ ] ; Nocturia[ ]   Vascular: Pain in legs with walking [ ] ; Pain in feet with lying flat [ ] ; Non-healing sores [ ] ; Stroke [ ] ; TIA [ ] ; Slurred speech [ ] ;    Neuro: Headaches[ ] ; Vertigo[ ] ; Seizures[ ] ; Paresthesias[ ] ;Blurred vision [ ] ; Diplopia [ ] ; Vision changes [ ]     Ortho/Skin: Arthritis [ ] ; Joint pain [ ] ; Muscle pain [ ] ; Joint swelling [ ] ; Back Pain [ ] ; Rash [ ]     Psych: Depression[ ] ; Anxiety[ ]     Heme: Bleeding problems [ ] ; Clotting disorders [ ] ; Anemia [ ]     Endocrine: Diabetes [ ] ; Thyroid dysfunction[ ]   Physical Exam: Blood pressure (!) 164/98, pulse 96, temperature 98.7 F (37.1 C), temperature source Oral, resp. rate 16, SpO2 96 %.   GENERAL: Patient is afebrile,  Vital signs reviewed, Well appearing, Patient appears comfortable, Alert and lucid. EYES: Normal inspection. HEENT:  normocephalic, atraumatic , normal ENT inspection. ORAL:  Moist NECK:  supple , normal inspection. No JVD. CARD:  regular rate and rhythm, with frequent ectopy, heart sounds normal with no m/r/g appreciated. RESP:  no respiratory distress, breath sounds normal. ABD: soft, nontender to palpation, normoactive bowel sounds EXT: distal pulses intact; no lower extremity edema, lukewarm to the touch SKIN: color normal, no rash, warm, dry  NEURO: No focal deficits noted PSYCH: mood/affect normal, awake & alert  Labs: Lab Results  Component Value Date   BUN 10 10/01/2019   Lab Results  Component Value Date   CREATININE 0.91 10/01/2019   Lab Results  Component Value Date   NA 136 10/01/2019   K 3.8 10/01/2019   CL 101 10/01/2019   CO2 25 10/01/2019   No results found for: TROPONINI Lab Results  Component Value Date   WBC 9.6 10/01/2019   HGB 16.3 10/01/2019   HCT 48.3 10/01/2019   MCV 88.3 10/01/2019   PLT 218 10/01/2019   Lab Results  Component Value Date   CHOL 176 08/29/2017   HDL 39 (L) 08/29/2017   LDLCALC 87 08/29/2017   TRIG 249 (H) 08/29/2017   CHOLHDL 4.5 08/29/2017   Lab Results  Component Value Date   ALT 31 08/29/2017   AST 30 08/29/2017   ALKPHOS 78 08/29/2017   BILITOT 0.7 08/29/2017      Radiology:   CXR: No active cardiopulmonary disease. EKG: Initial ECG was normal sinus rhythm without dynamic ST changes and a normal axis.  Subsequent ECGs show VT vs AIVR with a rate in the low 100s, coming from multiple foci.     ASSESSMENT AND PLAN:  Mr. Mccleery is a 54 year old gentleman with HLD, hypertriglyceridemia,  HTN (not previously on antihypertensives), and remote tobacco use history (quit over 20 years ago) who presented to the ED for evaluation of chest comfort and elevated blood pressures, he was subsequently found to have an NSTEMI.   While initial ECG showed no dynamic ST changes, subsequent ECG showed frequent ventricular ectopy and nonsustained VT.  Fortunately, patient is chest pain-free at present and largely asymptomatic from a rhythm perspective.   # NSTEMI # NSVT vs AIVR # HTN # HLD # Hypertriglyceridemia  - Admit to ICU for closer monitoring - Plan for coronary angiogram in AM - TTE ordered to evaluate systolic function - Continue heparin gtt protocol for ACS - Trend troponin and ECGs - Continue to watch on telemetry - Start captopril TID can uptitrate as needed  - Plan on consolidating to lisinopril closer to discharge - Hold off on starting beta blocker in the acute setting given sinus bradycardia - Bolus and start amiodarone infusion for VT  - Suspect ischemic VT given clinical presentation and can hopefully stop pending outcome of upcoming LHC  - I am somewhat concerned about the affected territory myocardium given how slow his VT is and its multifocality - Check lipid panel  - Continue current dose of amiodarone 40mg  daily; will need to assess adherence with this regimen -Aspirin 81 mg daily   Signed: Clois Dupes 10/02/2019, 3:05 AM

## 2019-10-02 NOTE — H&P (View-Only) (Signed)
Patient admitted earlier this morning with NSTEMI after presenting with chest discomfort pushing mowing his yard. Initial EKG showed normal sinus rhythm with mild T wave inversion in lateral leads. High-sensitivity troponin elevated at 185 >> 665. Repeat pending. In the ED, he was also noted to have frequent runs accelerated idioventricular rhythm as well as some non-sustained VT. He was started on IV Heparin and IV Amiodarone with bolus. Echo was ordered.   He has continued to have runs of AIVR and NSVT overnight. Longest run of AIVR seems to be 37 seconds. He will have lungs runs of AIVR and then a few normal beats and then runs of AIVR. Rates in the 60's to 80's. Longest run of NSVT about 8 beats. Ectopy seems to have calmed down a little right now.    Went by to check on patient this morning. He is resting comfortably. No recurrent chest pain on Ntiro patch. No shortness of breath. He notes some chest fluttering with arrhythmia but no lightheadedness or dizziness.   General: 54 y.o. male resting comfortably in no acute distress. HEENT: Normocephalic and atraumatic. Sclera clear.  Neck: Supple. No carotid bruits. No JVD. Heart: RRR. Distinct S1 and S2. No murmurs, gallops, or rubs. Radial and distal pedal pulses 2+ and equal bilaterally. Lungs: No increased work of breathing. Clear to ausculation bilaterally. No wheezes, rhonchi, or rales.  Abdomen: Soft, non-distended, and non-tender to palpation.  Extremities: No lower extremity edema.    Skin: Warm and dry. Neuro: No focal deficits. Psych: Normal affect. Responds appropriately.  Plan: NSTEMI/ NSVT/ AIVR - Continue IV Heparin and IV Amiodarone.-Post-cath anticipate weaning off of amiodarone and the beta-blocker if PCI done - Magnesium 1.8 this morning. Will supplement. Goal >2.0. - Potassium 3.8 last night . Will supplement. Goal >4.0. - Echo has been ordered. - Suspect patient will need left heart catheterization today. The patient  understands that risks include but are not limited to stroke (1 in 1000), death (1 in 38), kidney failure [usually temporary] (1 in 500), bleeding (1 in 200), allergic reaction [possibly serious] (1 in 200), and agrees to proceed.   Hypertension - BP still elevated but improved some from presentation. Diastolic BP still elevated.  - Started on Captopril 12.5mg  daily on admission. Will continue for now but will likely consolidate after cath. - Consider adding low dose Coreg 3.125mg  twice daily -> once off amiodarone  Hyperlipidemia - Lipid panel this morning: Total Cholesterol 323, Triglycerides 177, HDL 47, LDL 241.  - Will increase home Lipitor to 80 mg daily - Depending on cath results, may need to consider PCSK9 inhibitor as outpatient.  Darreld Mclean, PA-C 10/02/2019 7:15 AM   ATTENDING ATTESTATION  I have seen, examined and evaluated the patient this AM in the CVICU along with Sande Rives, PA.  After reviewing all the available data and chart, we discussed the patients laboratory, study & physical findings as well as symptoms in detail. I agree with her findings, examination as well as impression recommendations as per our discussion.    Attending adjustments noted in italics.   Rameses seems to be relatively comfortable this morning.  He says that his pain/discomfort in his chest started going away with the medicines they gave him when he got into his emergency room bed last night.  He had pain that initially went away while waiting in the ER waiting room and then it returned getting worse.  He has not had any that symptoms since. When I asked  him about any palpitation sensations would be a RVR and NSVT last night, he indicated that he just felt a nervous twitch over the night and thought maybe that he was just anxious because of having a heart attack.  He denies any lightheadedness or dizziness.  No nausea or vomiting.  EKG is very concerning with wide QRS complex and  intermittent AI VR would not stay VT. I agree with the admitting fellow that this is very concerning for possible reperfusion arrhythmia of an occluded vessel which would go along with him having significant reduction in pain.  I still think he probably has high-grade disease which could likely be LAD versus multivessel.  With him not having active ongoing chest pain, I think we can wait until second case, but he needs to go urgently upon completion of the first cases and not wait till later this morning.  Interesting that he is on captopril.  I do agree that we would probably want to switch to a longer acting ACE inhibitor (not the best option with pending cath), will also likely consider beta-blocker and wean off amiodarone pending findings.  He is does not seem to have had any further arrhythmia since starting amiodarone.   Performing MD:  [facility]  Procedure:  Left Heart Catheterization with Coronary Angiography & possible Percutaneous Coronary Intervention.  The procedure with Risks/Benefits/Alternatives and Indications was reviewed with the patient.  All questions were answered.    Risks / Complications include, but not limited to: Death, MI, CVA/TIA, VF/VT (with defibrillation), Bradycardia (need for temporary pacer placement), contrast induced nephropathy, bleeding / bruising / hematoma / pseudoaneurysm, vascular or coronary injury (with possible emergent CT or Vascular Surgery), adverse medication reactions, infection.  Additional risks involving the use of radiation with the possibility of radiation burns and cancer were explained in detail.  The patient voices understanding and agree to proceed.      Glenetta Hew, MD      Glenetta Hew, M.D., M.S. Interventional Cardiologist   Pager # [phone] Phone # [phone] [address] [place], [place] [address]

## 2019-10-02 NOTE — TOC Progression Note (Signed)
Transition of Care [facility]) - Progression Note    Patient Details  Name: Edward Pierce MRN: [id number] Date of Birth: 1965/08/29  [facility]) CM/SW [place], RN Phone Number: 10/02/2019, 4:05 PM  Clinical Narrative:    Edward Pierce with [facility] Pod with Life Vest and Echo results sent to [phone].        Expected Discharge Plan and Services                                                 Social Determinants of Health (SDOH) Interventions    Readmission Risk Interventions No flowsheet data found.

## 2019-10-02 NOTE — Progress Notes (Signed)
Pt going in and out of RBBB, no CP with this, just "fluttering" sensation.  Cards has been called.

## 2019-10-02 NOTE — Progress Notes (Signed)
Spoke again with cardiology, he is actually concerned that it isnt RBBB that patient going in and out of, but is actually a "slow VT"!  Cards is going to take over as primary he has informed me.

## 2019-10-02 NOTE — Care Management (Signed)
1336 10-02-19 Case Manager received consult for possible LifeVest. Information has been submitted for LifeVest to [place]. Awaiting for insurance authorization. No further needs from this Case Manager at this time. Bethena Roys, RN,BSN Case Manager

## 2019-10-03 ENCOUNTER — Telehealth: Payer: Self-pay | Admitting: *Deleted

## 2019-10-03 ENCOUNTER — Encounter (HOSPITAL_COMMUNITY): Payer: Self-pay | Admitting: Cardiology

## 2019-10-03 DIAGNOSIS — I5021 Acute systolic (congestive) heart failure: Secondary | ICD-10-CM

## 2019-10-03 DIAGNOSIS — I5020 Unspecified systolic (congestive) heart failure: Secondary | ICD-10-CM | POA: Diagnosis present

## 2019-10-03 DIAGNOSIS — I25119 Atherosclerotic heart disease of native coronary artery with unspecified angina pectoris: Secondary | ICD-10-CM

## 2019-10-03 DIAGNOSIS — Z955 Presence of coronary angioplasty implant and graft: Secondary | ICD-10-CM

## 2019-10-03 DIAGNOSIS — Z9189 Other specified personal risk factors, not elsewhere classified: Secondary | ICD-10-CM

## 2019-10-03 DIAGNOSIS — I25111 Atherosclerotic heart disease of native coronary artery with angina pectoris with documented spasm: Secondary | ICD-10-CM

## 2019-10-03 DIAGNOSIS — I2510 Atherosclerotic heart disease of native coronary artery without angina pectoris: Secondary | ICD-10-CM | POA: Diagnosis present

## 2019-10-03 DIAGNOSIS — E782 Mixed hyperlipidemia: Secondary | ICD-10-CM

## 2019-10-03 DIAGNOSIS — I255 Ischemic cardiomyopathy: Secondary | ICD-10-CM

## 2019-10-03 DIAGNOSIS — I472 Ventricular tachycardia, unspecified: Secondary | ICD-10-CM

## 2019-10-03 DIAGNOSIS — R030 Elevated blood-pressure reading, without diagnosis of hypertension: Secondary | ICD-10-CM

## 2019-10-03 HISTORY — DX: Ischemic cardiomyopathy: I25.5

## 2019-10-03 HISTORY — DX: Ventricular tachycardia, unspecified: I47.20

## 2019-10-03 HISTORY — DX: Atherosclerotic heart disease of native coronary artery with unspecified angina pectoris: I25.119

## 2019-10-03 HISTORY — DX: Ventricular tachycardia: I47.2

## 2019-10-03 LAB — BASIC METABOLIC PANEL
Anion gap: 9 (ref 5–15)
BUN: 7 mg/dL (ref 6–20)
CO2: 22 mmol/L (ref 22–32)
Calcium: 8.6 mg/dL — ABNORMAL LOW (ref 8.9–10.3)
Chloride: 106 mmol/L (ref 98–111)
Creatinine, Ser: 1.04 mg/dL (ref 0.61–1.24)
GFR calc Af Amer: >60 mL/min (ref >60)
GFR calc non Af Amer: >60 mL/min (ref >60)
Glucose, Bld: 171 mg/dL — ABNORMAL HIGH (ref 70–99)
Potassium: 3.8 mmol/L (ref 3.5–5.1)
Sodium: 137 mmol/L (ref 135–145)

## 2019-10-03 LAB — CBC
HCT: 42.5 % (ref 39.0–52.0)
Hemoglobin: 14 g/dL (ref 13.0–17.0)
MCH: 29.3 pg (ref 26.0–34.0)
MCHC: 32.9 g/dL (ref 30.0–36.0)
MCV: 88.9 fL (ref 80.0–100.0)
Platelets: 185 10*3/uL (ref 150–400)
RBC: 4.78 MIL/uL (ref 4.22–5.81)
RDW: 13.1 % (ref 11.5–15.5)
WBC: 5.9 10*3/uL (ref 4.0–10.5)
nRBC: 0 % (ref 0.0–0.2)

## 2019-10-03 MED ORDER — POTASSIUM CHLORIDE CRYS ER 20 MEQ PO TBCR
20.0000 meq | EXTENDED_RELEASE_TABLET | Freq: Once | ORAL | Status: AC
  Administered 2019-10-03: 20 meq via ORAL
  Filled 2019-10-03: qty 1

## 2019-10-03 MED ORDER — LOSARTAN POTASSIUM 25 MG PO TABS
25.0000 mg | ORAL_TABLET | Freq: Every day | ORAL | Status: DC
  Administered 2019-10-04 – 2019-10-05 (×2): 25 mg via ORAL
  Filled 2019-10-03 (×2): qty 1

## 2019-10-03 MED ORDER — CARVEDILOL 3.125 MG PO TABS
3.1250 mg | ORAL_TABLET | Freq: Two times a day (BID) | ORAL | Status: DC
  Administered 2019-10-03 – 2019-10-05 (×5): 3.125 mg via ORAL
  Filled 2019-10-03 (×5): qty 1

## 2019-10-03 NOTE — Telephone Encounter (Signed)
Danielle from [employer] called and stated insurance has denied life vest Will forward to Dr Ellyn Hack Danielle's phone number is [phone]

## 2019-10-03 NOTE — Plan of Care (Signed)
  Problem: Education: Goal: Knowledge of General Education information will improve Description: Including pain rating scale, medication(s)/side effects and non-pharmacologic comfort measures Outcome: Progressing   Problem: Health Behavior/Discharge Planning: Goal: Ability to manage health-related needs will improve Outcome: Progressing   Problem: Clinical Measurements: Goal: Will remain free from infection Outcome: Progressing Goal: Diagnostic test results will improve Outcome: Progressing Goal: Cardiovascular complication will be avoided Outcome: Progressing   Problem: Activity: Goal: Risk for activity intolerance will decrease Outcome: Progressing   Problem: Nutrition: Goal: Adequate nutrition will be maintained Outcome: Progressing   Problem: Coping: Goal: Level of anxiety will decrease Outcome: Progressing   Problem: Elimination: Goal: Will not experience complications related to bowel motility Outcome: Progressing Goal: Will not experience complications related to urinary retention Outcome: Progressing   Problem: Pain Managment: Goal: General experience of comfort will improve Outcome: Progressing   Problem: Safety: Goal: Ability to remain free from injury will improve Outcome: Progressing   Problem: Skin Integrity: Goal: Risk for impaired skin integrity will decrease Outcome: Progressing   Problem: Activity: Goal: Ability to tolerate increased activity will improve Outcome: Progressing   Problem: Cardiac: Goal: Ability to achieve and maintain adequate cardiopulmonary perfusion will improve Outcome: Progressing Goal: Vascular access site(s) Level 0-1 will be maintained Outcome: Progressing

## 2019-10-03 NOTE — Progress Notes (Signed)
Progress Note  Patient Name: Edward Pierce Date of Encounter: 10/03/2019  [facility] Cardiologist: Glenetta Hew, MD   Subjective   Feeling pretty good.  No chest pain.  Just a little tired  Inpatient Medications    Scheduled Meds:  aspirin EC  81 mg Oral Daily   atorvastatin  80 mg Oral QHS   captopril  12.5 mg Oral TID   Chlorhexidine Gluconate Cloth  6 each Topical Daily   mouth rinse  15 mL Mouth Rinse BID   nitroGLYCERIN  1 inch Topical Q6H   sodium chloride flush  3 mL Intravenous Q12H   ticagrelor  90 mg Oral BID   Continuous Infusions:  sodium chloride     amiodarone 30 mg/hr (10/03/19 0700)   PRN Meds: sodium chloride, acetaminophen, morphine injection, nitroGLYCERIN, ondansetron (ZOFRAN) IV, sodium chloride flush   Vital Signs    Vitals:   10/03/19 0500 10/03/19 0600 10/03/19 0700 10/03/19 0742  BP: 95/66 103/67 95/67   Pulse: 71 65 71   Resp: _0 Temp:    (!) 97.5 F (36.4 C)  TempSrc:    Axillary  SpO2: 97% 97% 98%   Weight:  92.9 kg    Height:        Intake/Output Summary (Last 24 hours) at 10/03/2019 0834 Last data filed at 10/03/2019 0700 Gross per 24 hour  Intake 1471.84 ml  Output 2475 ml  Net -1003.16 ml   Last 3 Weights 10/03/2019 10/02/2019 10/31/2017  Weight (lbs) 204 lb 12.9 oz 206 lb 12.7 oz 206 lb  Weight (kg) 92.9 kg 93.8 kg 93.441 kg      Telemetry    Sinus rhythm.  No further VT or any RVR- Personally Reviewed  ECG    NSR-64.  Rightward axis (90 degrees).  Septal MI, recent with evolutionary changes of anterior STEMI now present--Q waves with subtle ST elevation-biphasic ST-T with T wave inversions in V2 through V5 with stable T wave inversions in V6.- Personally Reviewed  Physical Exam   General appearance: alert, cooperative, appears stated age and mild distress Neck: no carotid bruit and no JVD Lungs: clear to auscultation bilaterally, normal percussion bilaterally and Nonlabored, good air  movement Heart: regular rate and rhythm, S1, S2 normal, no murmur, click, rub or gallop, normal apical impulse and Cannot exclude soft S4, but distant Abdomen: soft, non-tender; bowel sounds normal; no masses,  no organomegaly Extremities: extremities normal, atraumatic, no cyanosis or edema and Radial cath site with no hematoma Pulses: 2+ and symmetric Neurologic: Alert and oriented X 3, normal strength and tone. Normal symmetric reflexes. Normal coordination and gait   Labs    High Sensitivity Troponin:   Recent Labs  Lab 10/01/19 2232 10/02/19 0016 10/02/19 0551  TROPONINIHS 185* 665* 26,933*      Chemistry Recent Labs  Lab 10/01/19 2232 10/03/19 0322  NA 136 137  K 3.8 3.8  CL 101 106  CO2 25 22  GLUCOSE 125* 171*  BUN 10 7  CREATININE 0.91 1.04  CALCIUM 10.0 8.6*  GFRNONAA >60 >60  GFRAA >60 >60  ANIONGAP 10 9     Hematology Recent Labs  Lab 10/01/19 2232 10/02/19 1830 10/03/19 0322  WBC 9.6 6.4 5.9  RBC 5.47 4.89 4.78  HGB 16.3 14.3 14.0  HCT 48.3 42.4 42.5  MCV 88.3 86.7 88.9  MCH 29.8 29.2 29.3  MCHC 33.7 33.7 32.9  RDW 12.6 13.0 13.1  PLT 218 212 185  Lab Results  Component Value Date   HGBA1C 5.6 10/01/2019   Lab Results  Component Value Date   CHOL 323 (H) 10/02/2019   HDL 47 10/02/2019   LDLCALC 241 (H) 10/02/2019   TRIG 177 (H) 10/02/2019   CHOLHDL 6.9 10/02/2019     BNPNo results for input(s): BNP, PROBNP in the last 168 hours.   DDimer No results for input(s): DDIMER in the last 168 hours.   Radiology    DG Chest 2 View  Result Date: 10/01/2019 CLINICAL DATA:  Chest pain EXAM: CHEST - 2 VIEW COMPARISON:  None. FINDINGS: The heart size and mediastinal contours are within normal limits. Both lungs are clear. Mild degenerative changes of the spine. IMPRESSION: No active cardiopulmonary disease. Electronically Signed   By: Donavan Foil M.D.   On: 10/01/2019 22:45   CARDIAC CATHETERIZATION  Result Date: 10/02/2019  Mid LAD  lesion is 99% stenosed.  A drug-eluting stent was successfully placed using a SYNERGY XD 3.0X20.  Post intervention, there is a 0% residual stenosis.  Edward Pierce is a 54 y.o. male  [id number] LOCATION:  FACILITY: [facility] PHYSICIAN: Quay Burow, M.D. Jan 03, 1966 DATE OF PROCEDURE:  10/02/2019 DATE OF DISCHARGE: CARDIAC CATHETERIZATION / PCI DES LAD History obtained from chart review.  Edward Pierce  is a 54 year old thin appearing married Caucasian male without prior cardiac history.  Does have a family history of heart disease and hyperlipidemia not on statin therapy.  He developed chest pain yesterday was brought to the emergency room early yesterday evening.  His EKG showed no acute changes although he did have bundle branch block on and off throughout the night as well as runs of AI VR/nonsustained ventricular tachycardia.  He is placed on IV heparin and nitroglycerin.  His enzymes rose to greater than 27,000.  Because of his arrhythmias and ongoing mild chest pain he was brought to the cardiac catheterization laboratory earlier this morning for angiography and potential intervention. PROCEDURE DESCRIPTION: The patient was brought to the second floor [facility]iac cath lab in the postabsorptive state. He was premedicated with IV Versed and fentanyl. His right wrist was prepped and shaved in usual sterile fashion. Xylocaine 1% was used for local anesthesia. A 6 French sheath was inserted into the right radial artery using standard Seldinger technique. The patient received 5000 units  of heparin intravenously.  5 [place] TIG catheter and pigtail catheters were used for selective coronary angiography and obtain left heart pressures.  Isovue dye was used for the entirety of the case.  Retrograde aorta, ventricular and pullback pressures were recorded.  Radial cocktail was administered via the SideArm sheath. Patient had ruptured plaque in the mid LAD with heavy thrombus burden.  The decision was made to proceed with  PCI drug-eluting stenting with aspiration thrombectomy.  Patient received an additional 6 sessions of heparin with an ACT of 356.  He received Brilinta 180 mg as well as Aggrastat bolus and infusion. Using a 6 [place] XB LAD 3.5 cm guide catheter along with a 0.14 [place] guidewire and a Pronto P4 aspiration thrombectomy device multiple passes was performed across the thrombotic lesion in the mid LAD ultimately improving angiographic appearance and aspirating significant thrombus burden.  Following this direct stenting was performed with a 3 mm x 20 mm long Synergy drug-eluting stent deployed at 14 atm.  This was postdilated with a 3.25 x 15 mm long noncompliant balloon at 16 atm (3.3 mm) resulting reduction of the total occlusion to 0% residual.  There was TIMI I 2 flow prior to intervention and TIMI-3 flow post intervention.  The patient tolerated the procedure well.  The guidewire and catheter were removed.  The sheath was removed and a TR band was placed on the right wrist to achieve patent hemostasis.   Successful mid LAD aspiration thrombectomy followed by PCI and drug-eluting stenting of a subtotally occluded highly thrombotic mid LAD stenosis.  He had no other significant CAD.  Visual inspection of his 2D echo revealed an EF in the 25 to 30% range with anteroapical akinesia.  He will need dual antiplatelet therapy uninterrupted for 12 months as well as 18 hours of IV Aggrastat.  He would benefit from being on a LifeVest for 3 months given his LAD infarct, severe LV dysfunction and arrhythmias overnight.  Otherwise, he will need guideline directed optimal medical therapy including high-dose statin, beta-blocker, ACE inhibitor as well.  He left the lab in stable condition. Quay Burow. MD, [facility] 10/02/2019 11:27 AM   ECHOCARDIOGRAM COMPLETE  Result Date: 10/02/2019    ECHOCARDIOGRAM REPORT   Patient Name:   DREVION OFFORD [facility] Date of Exam: 10/02/2019 Medical Rec #:  [id number]     Height:       71.0 in  Accession #:    [id number]    Weight:       206.8 lb Date of Birth:  Feb 01, 1966      BSA:          2.139 m Patient Age:    54 years      BP:           143/88 mmHg Patient Gender: M             HR:           69 bpm. Exam Location:  Inpatient Procedure: 2D Echo, Cardiac Doppler and Color Doppler Indications:    NSTEMI  History:        Patient has no prior history of Echocardiogram examinations.                 Acute MI, Signs/Symptoms:Chest Pain; Risk Factors:Hypertension,                 Dyslipidemia and Current Smoker.  Sonographer:    Dustin Flock Referring Phys: [id number] [place]  1. Left ventricular ejection fraction, by estimation, is 35%. The left ventricle has moderately decreased function. The left ventricle demonstrates regional wall motion abnormalities with mid to apical anteroseptal and inferoseptal akinesis. The apical inferior wall, apical anterior wall, and the true apex are akinetic. No LV thrombus. Left ventricular diastolic parameters are consistent with Grade I diastolic dysfunction (impaired relaxation).  2. Right ventricular systolic function is normal. The right ventricular size is normal. Tricuspid regurgitation signal is inadequate for assessing PA pressure.  3. The mitral valve is normal in structure. Trivial mitral valve regurgitation. No evidence of mitral stenosis.  4. The aortic valve is tricuspid. Aortic valve regurgitation is not visualized. No aortic stenosis is present.  5. The inferior vena cava is normal in size with greater than 50% respiratory variability, suggesting right atrial pressure of 3 mmHg. FINDINGS  Left Ventricle: Left ventricular ejection fraction, by estimation, is 35%. The left ventricle has moderately decreased function. The left ventricle demonstrates regional wall motion abnormalities. The left ventricular internal cavity size was normal in size. There is no left ventricular hypertrophy. Left ventricular diastolic parameters are consistent with  Grade I diastolic dysfunction (impaired relaxation). Right Ventricle: The  right ventricular size is normal. No increase in right ventricular wall thickness. Right ventricular systolic function is normal. Tricuspid regurgitation signal is inadequate for assessing PA pressure. Left Atrium: Left atrial size was normal in size. Right Atrium: Right atrial size was normal in size. Pericardium: There is no evidence of pericardial effusion. Mitral Valve: The mitral valve is normal in structure. Trivial mitral valve regurgitation. No evidence of mitral valve stenosis. Tricuspid Valve: The tricuspid valve is normal in structure. Tricuspid valve regurgitation is not demonstrated. Aortic Valve: The aortic valve is tricuspid. Aortic valve regurgitation is not visualized. No aortic stenosis is present. Pulmonic Valve: The pulmonic valve was normal in structure. Pulmonic valve regurgitation is not visualized. Aorta: The aortic root is normal in size and structure. Venous: The inferior vena cava is normal in size with greater than 50% respiratory variability, suggesting right atrial pressure of 3 mmHg. IAS/Shunts: No atrial level shunt detected by color flow Doppler.  LEFT VENTRICLE PLAX 2D LVIDd:         5.00 cm      Diastology LVIDs:         3.30 cm      LV e' lateral:   6.85 cm/s LV PW:         1.10 cm      LV E/e' lateral: 7.9 LV IVS:        1.10 cm      LV e' medial:    5.77 cm/s LVOT diam:     2.20 cm      LV E/e' medial:  9.4 LV SV:         67 LV SV Index:   31 LVOT Area:     3.80 cm  LV Volumes (MOD) LV vol d, MOD A4C: 180.0 ml LV vol s, MOD A4C: 79.8 ml LV SV MOD A4C:     180.0 ml RIGHT VENTRICLE RV Basal diam:  3.10 cm RV S prime:     7.83 cm/s TAPSE (M-mode): 2.0 cm LEFT ATRIUM             Index       RIGHT ATRIUM           Index LA diam:        3.90 cm 1.82 cm/m  RA Area:     11.60 cm LA Vol (A2C):   44.5 ml 20.81 ml/m RA Volume:   25.40 ml  11.88 ml/m LA Vol (A4C):   37.8 ml 17.67 ml/m LA Biplane Vol: 41.9 ml  19.59 ml/m  AORTIC VALVE LVOT Vmax:   86.80 cm/s LVOT Vmean:  58.200 cm/s LVOT VTI:    0.175 m  AORTA Ao Root diam: 3.40 cm MITRAL VALVE MV Area (PHT): 4.31 cm    SHUNTS MV Decel Time: 176 msec    Systemic VTI:  0.18 m MV E velocity: 54.40 cm/s  Systemic Diam: 2.20 cm MV A velocity: 69.00 cm/s MV E/A ratio:  0.79 Loralie Champagne MD Electronically signed by Loralie Champagne MD Signature Date/Time: 10/02/2019/3:43:21 PM    Final     Cardiac Studies-summary    Cardiac Cath-PCI 10/02/2019: Culprit lesion mid LAD 99% heavily thrombotic lesion--> aspiration thrombectomy, DES PCI Synergy 3.0 x 23.3 mm).  Otherwise essentially normal coronaries with RI, LCx and RCA free of significant disease-RCA has mild proximal 20%..  Relatively normal LVEDP 7 mmHg.   TTE 10/02/2019: (Post anterior MI) EF roughly 35%.  Mid-apical anteroseptal and inferoseptal akinesis.  Apical inferior apical anterior and apical  akinesis.  GR 1 DD.  Otherwise normal valves, normal RV and atriae  Patient Profile     54 y.o. male   Assessment & Plan    Principal Problem:   NSTEMI (non-ST elevated myocardial infarction) ([place]) Active Problems:   Coronary artery disease involving native coronary artery of native heart with angina pectoris with documented spasm (HCC)   Presence of drug coated stent in LAD coronary artery   Ischemic cardiomyopathy   Hyperlipemia, mixed   Elevated blood pressure reading without diagnosis of hypertension   Sustained ventricular tachycardia (HCC)   Acute systolic heart failure (HCC)   At risk for sudden cardiac death  Principal Problem:   NSTEMI (non-ST elevated myocardial infarction) ([place]) - > did not have true ST elevation, however EKG now shows signs of evolutionary changes from anterior STEMI;   Coronary artery disease involving native coronary artery of native heart with angina pectoris with documented spasm ([place])   Presence of drug coated stent in LAD coronary artery. Heavily thrombotic mid LAD  lesion successfully treated with thrombectomy and DES PCI restoring TIMI-3 flow.  I suspect that this had initially been occluded with improved flow after heparin and aspirin.  This would explain AI VR noted upon arrival to the CCU.  On aspirin Brilinta.  High-dose high intensity statin  Will initiate low-dose carvedilol  Has been on captopril at home-we will hold for now and allow to initiate carvedilol; would then attempt to initiate ARB with hopes to potentially convert to [facility]    Ischemic cardiomyopathy /  Acute systolic heart failure ([place])  Significant reduced EF on echo not unexpectedly with anterior wall motion normality.  Hopefully this is hibernating myocardium some will recover.  Has room to start low-dose medicines, but relatively hypotensive for the course of the evening: We will start carvedilol 3.125 mg twice daily and stop captopril (allow for ACE inhibitor washout) and attempt to start low-dose ARB tomorrow.  Seems euvolemic, likely does not need diuretic at this time, but could consider spironolactone.    Hyperlipemia, mixed: Very poorly controlled, may very well need to consider PCSK9 inhibitor in the outpatient setting.  Initiate high-dose high intensity statin    Elevated blood pressure reading without diagnosis of hypertension  Not very hypertensive on arrival, was on captopril -> with plans to convert ARB, hold capital day and start low-dose carvedilol    Sustained ventricular tachycardia (HCC)/AI VR  Likely ischemic arrhythmia.  Is not sure if the V. tach was truly V. tach versus fast AI VR. ->  Ischemia now resolved  DC amiodarone and initiate beta-blocker  With presentation having V. tach and now low EF (EF 25%) and anterior distribution, is at high risk for sudden cardiac death.  Order placed for LifeVest.   Dispo: Ambulate in hallway today.  Anticipate transfer to telemetry today and likely discharge in 1 to 2 days.  Would like to have time to titrate  medications given his significant MI.   For questions or updates, please contact [facility] Please consult [email or web address] for contact info under        Signed, Glenetta Hew, MD  10/03/2019, 8:34 AM

## 2019-10-03 NOTE — Telephone Encounter (Signed)
I am aware.  We are working on it.  Thank you  Glenetta Hew, MD

## 2019-10-03 NOTE — Progress Notes (Signed)
CARDIAC REHAB PHASE I   PRE:  Rate/Rhythm: 60 SR  BP:  Sitting: 114/70      SaO2: 96 RA  MODE:  Ambulation: >1200 ft   POST:  Rate/Rhythm: 84 SR  BP:  Sitting: 112/69    SaO2: 99 RA  Pt ambulated >1200 in hallway independently with steady gait. Pt denies CP, SOB, or dizziness. Did note pt rubbing side, pt continues to deny pain. MI and stent education completed with pt. Pt educated on importance of ASA, Brilinta, statin, and NTG. Pt given MI book along with heart healthy diet. Reviewed site care, restrictions and exercise guidelines. Encouraged pt to weigh daily. Will f/iu tomorrow to address any questions or concerns. Will refer to CRP II [place]. Pt is interested in participating in Virtual Cardiac and Pulmonary Rehab. Pt advised that Virtual Cardiac and Pulmonary Rehab is provided at no cost to the patient.  Checklist:  1. Pt has smart device  ie smartphone and/or ipad for downloading an app  Yes 2. Reliable internet/wifi service    Yes 3. Understands how to use their smartphone and navigate within an app.  Yes  Pt verbalized understanding and is in agreement.  [place], RN BSN 10/03/2019 2:40 PM

## 2019-10-04 MED ORDER — TICAGRELOR 90 MG PO TABS: 90.0000 mg | ORAL_TABLET | Freq: Two times a day (BID) | ORAL | 11 refills | Status: DC

## 2019-10-04 MED ORDER — ASPIRIN 81 MG PO TBEC: 81.0000 mg | DELAYED_RELEASE_TABLET | Freq: Every day | ORAL | 11 refills | Status: DC

## 2019-10-04 MED ORDER — CARVEDILOL 3.125 MG PO TABS: 3.1250 mg | ORAL_TABLET | Freq: Two times a day (BID) | ORAL | 2 refills | Status: DC

## 2019-10-04 MED ORDER — ATORVASTATIN CALCIUM 80 MG PO TABS: 80.0000 mg | ORAL_TABLET | Freq: Every day | ORAL | 2 refills | Status: DC

## 2019-10-04 MED ORDER — NITROGLYCERIN 0.4 MG SL SUBL: 0.4000 mg | SUBLINGUAL_TABLET | SUBLINGUAL | 2 refills | Status: DC | PRN

## 2019-10-04 MED ORDER — LOSARTAN POTASSIUM 25 MG PO TABS: 25.0000 mg | ORAL_TABLET | Freq: Every day | ORAL | 2 refills | Status: DC

## 2019-10-04 MED FILL — LOSARTAN POTASSIUM 25 MG TA: 25 | 30 days supply | Qty: 30 | Fill #0

## 2019-10-04 MED FILL — CARVEDILOL 3.125 MG TABLET: 3.125 | 30 days supply | Qty: 60 | Fill #0

## 2019-10-04 MED FILL — NITROGLYCERIN 0.4 MG TAB SL: 0.4 | 7 days supply | Qty: 25 | Fill #0

## 2019-10-04 MED FILL — ATORVASTATIN CALCIUM 80 MG: 80 | 30 days supply | Qty: 30 | Fill #0

## 2019-10-04 MED FILL — BRILINTA 90 MG TABLET: 90 | 30 days supply | Qty: 60 | Fill #0

## 2019-10-04 NOTE — Progress Notes (Signed)
   Patient likely going to be discharged tomorrow morning. I went ahead and sent medications to [facility] per patient's request since Dr. Ellyn Hack did not suspect that any of current medications would change. Patient also needs LifeVest at discharge. [employer] rep, will deliver this tomorrow. I will notify weekend team of this so they are aware.   Darreld Mclean, PA-C 10/04/2019 7:30 PM

## 2019-10-04 NOTE — Progress Notes (Signed)
Progress Note  Patient Name: Edward Pierce Date of Encounter: 10/04/2019  [facility] Cardiologist: Edward Hew, MD   Subjective   Overall much improved today.  Has not had any more chest pain.  No arrhythmia.  Despite having relatively low blood pressures reported, he has not had any lightheadedness or dizziness.  Lowest pressures were during sleep hours.  LifeVest rep is not working with the patient.  Initial responsive insurance company is denial.  Plan now is to work through the patient assistance program provided by [employer] -> would hope to have this approved and have the device placed today for potential discharge tomorrow.  Inpatient Medications    Scheduled Meds: . aspirin EC  81 mg Oral Daily  . atorvastatin  80 mg Oral QHS  . carvedilol  3.125 mg Oral BID WC  . Chlorhexidine Gluconate Cloth  6 each Topical Daily  . losartan  25 mg Oral Daily  . mouth rinse  15 mL Mouth Rinse BID  . sodium chloride flush  3 mL Intravenous Q12H  . ticagrelor  90 mg Oral BID   Continuous Infusions: . sodium chloride     PRN Meds: sodium chloride, acetaminophen, morphine injection, nitroGLYCERIN, ondansetron (ZOFRAN) IV, sodium chloride flush   Vital Signs    Vitals:   10/03/19 1900 10/03/19 2300 10/04/19 0300 10/04/19 0729  BP:      Pulse:      Resp:      Temp: (!) 97.5 F (36.4 C) 97.9 F (36.6 C) 98.1 F (36.7 C) (!) 96.9 F (36.1 C)  TempSrc: Oral Oral Oral Axillary  SpO2:      Weight:      Height:        Intake/Output Summary (Last 24 hours) at 10/04/2019 0811 Last data filed at 10/03/2019 1200 Gross per 24 hour  Intake 261.01 ml  Output 450 ml  Net -188.99 ml   Last 3 Weights 10/03/2019 10/02/2019 10/31/2017  Weight (lbs) 204 lb 12.9 oz 206 lb 12.7 oz 206 lb  Weight (kg) 92.9 kg 93.8 kg 93.441 kg      Telemetry    Sinus rhythm occasional PVCs- Personally Reviewed  ECG    No new EKG.- Personally Reviewed  Physical Exam   General appearance: alert,  cooperative, appears stated age, no distress and Resting comfortably. Neck: no carotid bruit and no JVD Lungs: clear to auscultation bilaterally, normal percussion bilaterally and Good air movement, nonlabored Heart: regular rate and rhythm, S1, S2 normal, no murmur, click, rub or gallop, normal apical impulse and S4 not heard today Abdomen: soft, non-tender; bowel sounds normal; no masses,  no organomegaly Extremities: extremities normal, atraumatic, no cyanosis or edema and Radial cath site no hematoma or bruising Pulses: 2+ and symmetric Neurologic: Grossly normal   Labs    High Sensitivity Troponin:   Recent Labs  Lab 10/01/19 2232 10/02/19 0016 10/02/19 0551  TROPONINIHS 185* 665* 26,933*      Chemistry Recent Labs  Lab 10/01/19 2232 10/03/19 0322  NA 136 137  K 3.8 3.8  CL 101 106  CO2 25 22  GLUCOSE 125* 171*  BUN 10 7  CREATININE 0.91 1.04  CALCIUM 10.0 8.6*  GFRNONAA >60 >60  GFRAA >60 >60  ANIONGAP 10 9     Hematology Recent Labs  Lab 10/01/19 2232 10/02/19 1830 10/03/19 0322  WBC 9.6 6.4 5.9  RBC 5.47 4.89 4.78  HGB 16.3 14.3 14.0  HCT 48.3 42.4 42.5  MCV 88.3 86.7 88.9  MCH 29.8 29.2 29.3  MCHC 33.7 33.7 32.9  RDW 12.6 13.0 13.1  PLT 218 212 185   Lab Results  Component Value Date   HGBA1C 5.6 10/01/2019   Lab Results  Component Value Date   CHOL 323 (H) 10/02/2019   HDL 47 10/02/2019   LDLCALC 241 (H) 10/02/2019   TRIG 177 (H) 10/02/2019   CHOLHDL 6.9 10/02/2019     BNPNo results for input(s): BNP, PROBNP in the last 168 hours.   DDimer No results for input(s): DDIMER in the last 168 hours.   Radiology    CARDIAC CATHETERIZATION  Result Date: 10/02/2019  Mid LAD lesion is 99% stenosed.  A drug-eluting stent was successfully placed using a SYNERGY XD 3.0X20.  Post intervention, there is a 0% residual stenosis.  Edward Pierce is a 54 y.o. male  [id number] LOCATION:  FACILITY: [facility] PHYSICIAN: Edward Pierce, M.D. [birth year] DATE  OF PROCEDURE:  10/02/2019 DATE OF DISCHARGE: CARDIAC CATHETERIZATION / PCI DES LAD History obtained from chart review.  Edward Pierce  is a 54 year old thin appearing married Caucasian male without prior cardiac history.  Does have a family history of heart disease and hyperlipidemia not on statin therapy.  He developed chest pain yesterday was brought to the emergency room early yesterday evening.  His EKG showed no acute changes although he did have bundle branch block on and off throughout the night as well as runs of AI VR/nonsustained ventricular tachycardia.  He is placed on IV heparin and nitroglycerin.  His enzymes rose to greater than 27,000.  Because of his arrhythmias and ongoing mild chest pain he was brought to the cardiac catheterization laboratory earlier this morning for angiography and potential intervention. PROCEDURE DESCRIPTION: The patient was brought to the second floor [facility] cath lab in the postabsorptive state. He was premedicated with IV Versed and fentanyl. His right wrist was prepped and shaved in usual sterile fashion. Xylocaine 1% was used for local anesthesia. A 6 French sheath was inserted into the right radial artery using standard Seldinger technique. The patient received 5000 units  of heparin intravenously.  5 [place] TIG catheter and pigtail catheters were used for selective coronary angiography and obtain left heart pressures.  Isovue dye was used for the entirety of the case.  Retrograde aorta, ventricular and pullback pressures were recorded.  Radial cocktail was administered via the SideArm sheath. Patient had ruptured plaque in the mid LAD with heavy thrombus burden.  The decision was made to proceed with PCI drug-eluting stenting with aspiration thrombectomy.  Patient received an additional 6 sessions of heparin with an ACT of 356.  He received Brilinta 180 mg as well as Aggrastat bolus and infusion. Using a 6 [place] XB LAD 3.5 cm guide catheter along with a 0.14  [place] guidewire and a Pronto P4 aspiration thrombectomy device multiple passes was performed across the thrombotic lesion in the mid LAD ultimately improving angiographic appearance and aspirating significant thrombus burden.  Following this direct stenting was performed with a 3 mm x 20 mm long Synergy drug-eluting stent deployed at 14 atm.  This was postdilated with a 3.25 x 15 mm long noncompliant balloon at 16 atm (3.3 mm) resulting reduction of the total occlusion to 0% residual.  There was TIMI I 2 flow prior to intervention and TIMI-3 flow post intervention.  The patient tolerated the procedure well.  The guidewire and catheter were removed.  The sheath was removed and a TR band was placed on the  right wrist to achieve patent hemostasis.   Successful mid LAD aspiration thrombectomy followed by PCI and drug-eluting stenting of a subtotally occluded highly thrombotic mid LAD stenosis.  He had no other significant CAD.  Visual inspection of his 2D echo revealed an EF in the 25 to 30% range with anteroapical akinesia.  He will need dual antiplatelet therapy uninterrupted for 12 months as well as 18 hours of IV Aggrastat.  He would benefit from being on a LifeVest for 3 months given his LAD infarct, severe LV dysfunction and arrhythmias overnight.  Otherwise, he will need guideline directed optimal medical therapy including high-dose statin, beta-blocker, ACE inhibitor as well.  He left the lab in stable condition. Edward Pierce. MD, [facility] 10/02/2019 11:27 AM   ECHOCARDIOGRAM COMPLETE  Result Date: 10/02/2019    ECHOCARDIOGRAM REPORT   Patient Name:   Edward Pierce [facility] Date of Exam: 10/02/2019 Medical Rec #:  [id number]     Height:       71.0 in Accession #:    [id number]    Weight:       206.8 lb Date of Birth:  17-May-1965      BSA:          2.139 m Patient Age:    76 years      BP:           143/88 mmHg Patient Gender: M             HR:           69 bpm. Exam Location:  Inpatient Procedure: 2D Echo, Cardiac  Doppler and Color Doppler Indications:    NSTEMI  History:        Patient has no prior history of Echocardiogram examinations.                 Acute MI, Signs/Symptoms:Chest Pain; Risk Factors:Hypertension,                 Dyslipidemia and Current Smoker.  Sonographer:    Dustin Flock Referring Phys: [id number] [place]  1. Left ventricular ejection fraction, by estimation, is 35%. The left ventricle has moderately decreased function. The left ventricle demonstrates regional wall motion abnormalities with mid to apical anteroseptal and inferoseptal akinesis. The apical inferior wall, apical anterior wall, and the true apex are akinetic. No LV thrombus. Left ventricular diastolic parameters are consistent with Grade I diastolic dysfunction (impaired relaxation).  2. Right ventricular systolic function is normal. The right ventricular size is normal. Tricuspid regurgitation signal is inadequate for assessing PA pressure.  3. The mitral valve is normal in structure. Trivial mitral valve regurgitation. No evidence of mitral stenosis.  4. The aortic valve is tricuspid. Aortic valve regurgitation is not visualized. No aortic stenosis is present.  5. The inferior vena cava is normal in size with greater than 50% respiratory variability, suggesting right atrial pressure of 3 mmHg. FINDINGS  Left Ventricle: Left ventricular ejection fraction, by estimation, is 35%. The left ventricle has moderately decreased function. The left ventricle demonstrates regional wall motion abnormalities. The left ventricular internal cavity size was normal in size. There is no left ventricular hypertrophy. Left ventricular diastolic parameters are consistent with Grade I diastolic dysfunction (impaired relaxation). Right Ventricle: The right ventricular size is normal. No increase in right ventricular wall thickness. Right ventricular systolic function is normal. Tricuspid regurgitation signal is inadequate for assessing PA  pressure. Left Atrium: Left atrial size was normal in size. Right Atrium: Right atrial size  was normal in size. Pericardium: There is no evidence of pericardial effusion. Mitral Valve: The mitral valve is normal in structure. Trivial mitral valve regurgitation. No evidence of mitral valve stenosis. Tricuspid Valve: The tricuspid valve is normal in structure. Tricuspid valve regurgitation is not demonstrated. Aortic Valve: The aortic valve is tricuspid. Aortic valve regurgitation is not visualized. No aortic stenosis is present. Pulmonic Valve: The pulmonic valve was normal in structure. Pulmonic valve regurgitation is not visualized. Aorta: The aortic root is normal in size and structure. Venous: The inferior vena cava is normal in size with greater than 50% respiratory variability, suggesting right atrial pressure of 3 mmHg. IAS/Shunts: No atrial level shunt detected by color flow Doppler.  LEFT VENTRICLE PLAX 2D LVIDd:         5.00 cm      Diastology LVIDs:         3.30 cm      LV e' lateral:   6.85 cm/s LV PW:         1.10 cm      LV E/e' lateral: 7.9 LV IVS:        1.10 cm      LV e' medial:    5.77 cm/s LVOT diam:     2.20 cm      LV E/e' medial:  9.4 LV SV:         67 LV SV Index:   31 LVOT Area:     3.80 cm  LV Volumes (MOD) LV vol d, MOD A4C: 180.0 ml LV vol s, MOD A4C: 79.8 ml LV SV MOD A4C:     180.0 ml RIGHT VENTRICLE RV Basal diam:  3.10 cm RV S prime:     7.83 cm/s TAPSE (M-mode): 2.0 cm LEFT ATRIUM             Index       RIGHT ATRIUM           Index LA diam:        3.90 cm 1.82 cm/m  RA Area:     11.60 cm LA Vol (A2C):   44.5 ml 20.81 ml/m RA Volume:   25.40 ml  11.88 ml/m LA Vol (A4C):   37.8 ml 17.67 ml/m LA Biplane Vol: 41.9 ml 19.59 ml/m  AORTIC VALVE LVOT Vmax:   86.80 cm/s LVOT Vmean:  58.200 cm/s LVOT VTI:    0.175 m  AORTA Ao Root diam: 3.40 cm MITRAL VALVE MV Area (PHT): 4.31 cm    SHUNTS MV Decel Time: 176 msec    Systemic VTI:  0.18 m MV E velocity: 54.40 cm/s  Systemic Diam: 2.20  cm MV A velocity: 69.00 cm/s MV E/A ratio:  0.79 Loralie Champagne MD Electronically signed by Loralie Champagne MD Signature Date/Time: 10/02/2019/3:43:21 PM    Final     Cardiac Studies-summary    Cardiac Cath-PCI 10/02/2019: Culprit lesion mid LAD 99% heavily thrombotic lesion--> aspiration thrombectomy, DES PCI Synergy 3.0 x 23. 3.3 mm).  Otherwise essentially normal coronaries with RI, LCx and RCA free of significant disease-RCA has mild proximal 20%..  Relatively normal LVEDP 7 mmHg.   TTE 10/02/2019: (Post anterior MI) EF roughly 35%.  Mid-apical anteroseptal and inferoseptal akinesis.  Apical inferior apical anterior and apical akinesis.  GR 1 DD.  Otherwise normal valves, normal RV and atriae  Patient Profile     54 y.o. male admitted overnight 7/20-21 with chest pain-abnormal EKG but did not meet ST elevation criteria.  Had IVCD  noted.  Initial slowed troponin elevation, however overnight had intermittent runs of NSVT and AIVR.  Was initially placed on IV amiodarone to stabilize and then was taken to the Cath Lab where he was found to have 95-99% thrombotic stenosis in the mid LAD --> treated with aspiration thrombectomy and DES PCI. Initial EF estimated 25 to 30%, with echo showing EF up to 35%.  Anterior wall motion abnormality.  Assessment & Plan    Principal Problem:   NSTEMI (non-ST elevated myocardial infarction) ([place]) Active Problems:   Coronary artery disease involving native coronary artery of native heart with angina pectoris with documented spasm (HCC)   Presence of drug coated stent in LAD coronary artery   Ischemic cardiomyopathy   Hyperlipidemia with target LDL less than 70   Elevated blood pressure reading without diagnosis of hypertension   Sustained ventricular tachycardia (HCC)   Acute systolic heart failure (HCC)   At risk for sudden cardiac death  Principal Problem:   NSTEMI (non-ST elevated myocardial infarction) ([place]) - > did not have true ST elevation, however EKG  now shows signs of evolutionary changes from anterior STEMI;   Coronary artery disease involving native coronary artery of native heart with angina pectoris with documented spasm (HCC)   Presence of drug coated stent in LAD coronary artery. ->  Synergy 3 oh by 23 mm stent postdilated to 3.3 mm. Heavily thrombotic mid LAD lesion successfully treated with thrombectomy and DES PCI restoring TIMI-3 flow.  I suspect that this had initially been occluded with improved flow after heparin and aspirin.  This would explain AI VR noted upon arrival to the CCU.  DAPT-ASA/Brilinta  High-dose atorvastatin  Low-dose carvedilol initiated yesterday.  ACE inhibitor held on arrival to allow for initiation of carvedilol.  Low-dose losartan ordered for today.  (BP would not allow for initiation of Entresto)    Ischemic cardiomyopathy /  Acute systolic heart failure ([place]): EF 35% with anterior wall motion abnormality  Hope is that the portion of this region is hibernating myocardium.  Would hope for recovery of function.  Euvolemic on exam.--Not requiring diuretic.  Had borderline hypertension prior to arrival, but pressures currently are mostly in the high 90s to low 100s.->  Seems to have tolerated low-dose carvedilol although pressures overnight were relatively low (as low as 73S systolic) we will continue current dose of carvedilol.  ACE inhibitor held with plan to initiate losartan this morning.  Would like to follow at least 1 a day to ensure that he does not get hypotensive and is able to ambulate without lightheadedness or dizziness.  Depending on how he tolerates losartan, would consider adding spironolactone 12.5 mg versus converting to [place] on discharge.    Hyperlipemia, mixed: Very poorly controlled,  Started on high-dose statin-May need to consider PCSK9 ambulatory outpatient setting.    Elevated blood pressure reading without diagnosis of hypertension  No longer hypertensive.  In fact is  borderline hypotensive.  Having a hard time titrating CAD/ICM meds.    Sustained ventricular tachycardia (HCC)/AI VR  Likely ischemic arrhythmia.  Is not sure if the V. tach was truly V. tach versus fast AI VR. ->  Ischemia now resolved  DC amiodarone and initiate beta-blocker  LifeVest ordered--high risk for sudden cardiac death with EF of 35% and anterior wall motion abnormality after MI --> initial response from insurance company was denial.  Currently working on other options to obtain device.   Dispo: Has ambulated in the hallway.  Has transfer orders  to telemetry.  Would like to monitor today to see his blood pressure response to addition of losartan.  Are also awaiting decision on LifeVest.  Anticipate discharge tomorrow.  We will establish outpatient follow-up in preparation for discharge tomorrow.   For questions or updates, please contact [place] Please consult [email or web address] for contact info under        Signed, Edward Hew, MD  10/04/2019, 8:11 AM

## 2019-10-04 NOTE — Progress Notes (Signed)
CARDIAC REHAB PHASE I    Pt ambulated >1000 ft in hallway independently with steady gait. Pt denies CP, SOB, or dizziness. Reviewed importance of medication and lifevest compliance. Pts biggest concern is cost. Encouraged continued ambulation with emphasis on not overdoing it. Referred to CRP II [place] Rufina Falco, RN BSN 10/04/2019 2:07 PM

## 2019-10-05 DIAGNOSIS — E785 Hyperlipidemia, unspecified: Secondary | ICD-10-CM

## 2019-10-05 NOTE — Progress Notes (Signed)
Progress Note  Patient Name: Edward Pierce Date of Encounter: 10/05/2019  [facility] Cardiologist: Glenetta Hew, MD   Subjective   No issues overnight - LifeVest being placed this morning. Labs stable today. BP improved.  Inpatient Medications    Scheduled Meds: . aspirin EC  81 mg Oral Daily  . atorvastatin  80 mg Oral QHS  . carvedilol  3.125 mg Oral BID WC  . Chlorhexidine Gluconate Cloth  6 each Topical Daily  . losartan  25 mg Oral Daily  . mouth rinse  15 mL Mouth Rinse BID  . sodium chloride flush  3 mL Intravenous Q12H  . ticagrelor  90 mg Oral BID   Continuous Infusions: . sodium chloride     PRN Meds: sodium chloride, acetaminophen, morphine injection, nitroGLYCERIN, ondansetron (ZOFRAN) IV, sodium chloride flush   Vital Signs    Vitals:   10/04/19 1637 10/04/19 1940 10/05/19 0456 10/05/19 0822  BP: 113/83 106/74 113/73 123/74  Pulse: 64 66 66 82  Resp: 18 18 18 16   Temp: 98 F (36.7 C) 98 F (36.7 C) 97.9 F (36.6 C) 97.9 F (36.6 C)  TempSrc: Oral Oral Oral Oral  SpO2: 100% 98% 98% 99%  Weight:   91.7 kg   Height:        Intake/Output Summary (Last 24 hours) at 10/05/2019 4132 Last data filed at 10/04/2019 2145 Gross per 24 hour  Intake 240 ml  Output --  Net 240 ml   Last 3 Weights 10/05/2019 10/03/2019 10/02/2019  Weight (lbs) 202 lb 3.2 oz 204 lb 12.9 oz 206 lb 12.7 oz  Weight (kg) 91.717 kg 92.9 kg 93.8 kg      Telemetry    Sinus rhythm- Personally Reviewed  ECG    N/A  Physical Exam   General appearance: alert, cooperative, appears stated age, no distress and Resting comfortably. Neck: no carotid bruit and no JVD Lungs: clear to auscultation bilaterally, normal percussion bilaterally and Good air movement, nonlabored Heart: regular rate and rhythm, S1, S2 normal, no murmur, click, rub or gallop, normal apical impulse and S4 not heard today Abdomen: soft, non-tender; bowel sounds normal; no masses,  no  organomegaly Extremities: extremities normal, atraumatic, no cyanosis or edema and Radial cath site no hematoma or bruising Pulses: 2+ and symmetric Neurologic: Grossly normal   Labs    High Sensitivity Troponin:   Recent Labs  Lab 10/01/19 2232 10/02/19 0016 10/02/19 0551  TROPONINIHS 185* 665* 26,933*      Chemistry Recent Labs  Lab 10/01/19 2232 10/03/19 0322  NA 136 137  K 3.8 3.8  CL 101 106  CO2 25 22  GLUCOSE 125* 171*  BUN 10 7  CREATININE 0.91 1.04  CALCIUM 10.0 8.6*  GFRNONAA >60 >60  GFRAA >60 >60  ANIONGAP 10 9     Hematology Recent Labs  Lab 10/01/19 2232 10/02/19 1830 10/03/19 0322  WBC 9.6 6.4 5.9  RBC 5.47 4.89 4.78  HGB 16.3 14.3 14.0  HCT 48.3 42.4 42.5  MCV 88.3 86.7 88.9  MCH 29.8 29.2 29.3  MCHC 33.7 33.7 32.9  RDW 12.6 13.0 13.1  PLT 218 212 185   Lab Results  Component Value Date   HGBA1C 5.6 10/01/2019   Lab Results  Component Value Date   CHOL 323 (H) 10/02/2019   HDL 47 10/02/2019   LDLCALC 241 (H) 10/02/2019   TRIG 177 (H) 10/02/2019   CHOLHDL 6.9 10/02/2019     BNPNo results for input(s):  BNP, PROBNP in the last 168 hours.   DDimer No results for input(s): DDIMER in the last 168 hours.   Radiology    No results found.  Cardiac Studies-summary    Cardiac Cath-PCI 10/02/2019: Culprit lesion mid LAD 99% heavily thrombotic lesion--> aspiration thrombectomy, DES PCI Synergy 3.0 x 23. 3.3 mm).  Otherwise essentially normal coronaries with RI, LCx and RCA free of significant disease-RCA has mild proximal 20%..  Relatively normal LVEDP 7 mmHg.   TTE 10/02/2019: (Post anterior MI) EF roughly 35%.  Mid-apical anteroseptal and inferoseptal akinesis.  Apical inferior apical anterior and apical akinesis.  GR 1 DD.  Otherwise normal valves, normal RV and atriae  Patient Profile     54 y.o. male admitted overnight 7/20-21 with chest pain-abnormal EKG but did not meet ST elevation criteria.  Had IVCD noted.  Initial slowed  troponin elevation, however overnight had intermittent runs of NSVT and AIVR.  Was initially placed on IV amiodarone to stabilize and then was taken to the Cath Lab where he was found to have 95-99% thrombotic stenosis in the mid LAD --> treated with aspiration thrombectomy and DES PCI. Initial EF estimated 25 to 30%, with echo showing EF up to 35%.  Anterior wall motion abnormality.  Assessment & Plan    Principal Problem:   NSTEMI (non-ST elevated myocardial infarction) ([place]) Active Problems:   Hyperlipidemia with target LDL less than 70   Elevated blood pressure reading without diagnosis of hypertension   Coronary artery disease involving native coronary artery of native heart with angina pectoris with documented spasm (HCC)   Presence of drug coated stent in LAD coronary artery   Sustained ventricular tachycardia (HCC)   Ischemic cardiomyopathy   Acute systolic heart failure (HCC)   At risk for sudden cardiac death  Principal Problem:   NSTEMI (non-ST elevated myocardial infarction) ([place]) - > did not have true ST elevation, however EKG now shows signs of evolutionary changes from anterior STEMI;   Coronary artery disease involving native coronary artery of native heart with angina pectoris with documented spasm ([place])   Presence of drug coated stent in LAD coronary artery. ->  Synergy 3 oh by 23 mm stent postdilated to 3.3 mm. Heavily thrombotic mid LAD lesion successfully treated with thrombectomy and DES PCI restoring TIMI-3 flow.  I suspect that this had initially been occluded with improved flow after heparin and aspirin.  This would explain AI VR noted upon arrival to the CCU.  DAPT-ASA/Brilinta  High-dose atorvastatin  Low-dose carvedilol initiated yesterday.  ACE inhibitor held on arrival to allow for initiation of carvedilol.  Low-dose losartan ordered for today.  (BP would not allow for initiation of Entresto)    Ischemic cardiomyopathy /  Acute systolic heart failure ([place]):  EF 35% with anterior wall motion abnormality  Hope is that the portion of this region is hibernating myocardium.  Would hope for recovery of function.  Euvolemic on exam.--Not requiring diuretic.  Had borderline hypertension prior to arrival, but pressures currently are mostly in the high 90s to low 100s.->  Seems to have tolerated low-dose carvedilol although pressures overnight were relatively low (as low as 89F systolic) we will continue current dose of carvedilol.  ACE inhibitor held with plan to initiate losartan this morning.  Would like to follow at least 1 a day to ensure that he does not get hypotensive and is able to ambulate without lightheadedness or dizziness.  BP stable, but will not likely anticipate additional medication  Hyperlipemia, mixed: Very poorly controlled,  Started on high-dose statin-May need to consider PCSK9 ambulatory outpatient setting.    Elevated blood pressure reading without diagnosis of hypertension  No longer hypertensive.  In fact is borderline hypotensive.  Having a hard time titrating CAD/ICM meds.    Sustained ventricular tachycardia (HCC)/AI VR  Likely ischemic arrhythmia.  Is not sure if the V. tach was truly V. tach versus fast AI VR. ->  Ischemia now resolved  DC amiodarone and initiate beta-blocker  LifeVest placed today   Seen and examined as above. [place] for d/c home today. Follow-up with Dr. Ellyn Hack on 8/9.    For questions or updates, please contact [place] Please consult [email or web address] for contact info under   Pixie Casino, MD, FACC, [place] Director of the Advanced Lipid Disorders &  Cardiovascular Risk Reduction Clinic Diplomate of the American Board of Clinical Lipidology Attending Cardiologist  Direct Dial: [phone]  Fax: [phone]  Website:  [email or web address]  Pixie Casino, MD  10/05/2019, 8:42 AM

## 2019-10-05 NOTE — Plan of Care (Signed)
Pt d/c to home  with self care, d/c education printed and verbal completed with pt.

## 2019-10-05 NOTE — Discharge Summary (Addendum)
Discharge Summary    Patient ID: Edward Pierce MRN: [id number]; DOB: 01/07/1966  Admit date: 10/01/2019 Discharge date: 10/05/2019  Primary Care Provider: Lorrene Reid, PA-C  Primary Cardiologist: Glenetta Hew, MD  Primary Electrophysiologist:  None   Discharge Diagnoses    Principal Problem:   NSTEMI (non-ST elevated myocardial infarction) [facility]) Active Problems:   Hyperlipidemia with target LDL less than 70   Elevated blood pressure reading without diagnosis of hypertension   Coronary artery disease involving native coronary artery of native heart with angina pectoris with documented spasm ([place])   Presence of drug coated stent in LAD coronary artery   Sustained ventricular tachycardia ([place])   Ischemic cardiomyopathy   Acute systolic heart failure ([place])   At risk for sudden cardiac death   Allergies No Known Allergies  Diagnostic Studies/Procedures     Cardiac Cath-PCI 10/02/2019: Culprit lesion mid LAD 99% heavily thrombotic lesion--> aspiration thrombectomy, DES PCI Synergy 3.0 x 23. 3.3 mm).  Otherwise essentially normal coronaries with RI, LCx and RCA free of significant disease-RCA has mild proximal 20%..  Relatively normal LVEDP 7 mmHg.   TTE 10/02/2019: (Post anterior MI) EF roughly 35%.  Mid-apical anteroseptal and inferoseptal akinesis.  Apical inferior apical anterior and apical akinesis.  GR 1 DD.  Otherwise normal valves, normal RV and atriae  _____________   History of Present Illness     Edward Pierce is a 54 y.o. male with HLD, hypertriglyceridemia, HTN (not previously on antihypertensives), and remote tobacco use history (quit over 20 years ago). He presented to the ED 07/21 for evaluation of chest comfort and elevated blood pressures.  Hospital Course     Consultants: none   His cardiac enzymes were elevated indicating a non-STEMI.  He was also noted to have accelerated idioventricular rhythm and as well as some nonsustained VT.  He was started on IV  amiodarone.  He was taken to the Cath Lab on 10/02/2019.  He had a thrombotic LAD that was treated with aspiration thrombectomy and DES.  He tolerated the procedure well.  Lipid profile is below, his Lipitor was increased to 80 mg daily.  He was started on low-dose carvedilol.  His home captopril was held to allow for initiation of a beta-blocker.  He was started on low-dose losartan with the plan to change to [facility] as an outpatient.  After treatment of his LAD lesion, his ventricular arrhythmia improved.  He will not be discharged on amiodarone.  His EF was 35% by echocardiogram.  He is on beta-blocker and ARB, blood pressure will not allow him to also be started on spironolactone.  Volume status stable, does not need a diuretic at this time. He was fitted for a LifeVest prior to d/c. His meds were sent to the [facility] so he could have them with him at d/c.   Dr Debara Pickett saw him 07/24 and cleared him for discharge, in improved condition.   Did the patient have an acute coronary syndrome (MI, NSTEMI, STEMI, etc) this admission?:  Yes                               AHA/ACC Clinical Performance & Quality Measures: 1. Aspirin prescribed? - Yes 2. ADP Receptor Inhibitor (Plavix/Clopidogrel, Brilinta/Ticagrelor or Effient/Prasugrel) prescribed (includes medically managed patients)? - Yes 3. Beta Blocker prescribed? - Yes 4. High Intensity Statin (Lipitor 40-41m or Crestor 20-460m prescribed? - Yes 5. EF assessed during THIS hospitalization? -  Yes 6. For EF <40%, was ACEI/ARB prescribed? - Yes 7. For EF <40%, Aldosterone Antagonist (Spironolactone or Eplerenone) prescribed? - No - Reason:  BP too low 8. Cardiac Rehab Phase II ordered (including medically managed patients)? - Yes   _____________  Discharge Vitals Blood pressure 123/74, pulse 82, temperature 97.9 F (36.6 C), temperature source Oral, resp. rate 16, height _0  (1.803 m), weight 91.7 kg, SpO2 99 %.  Filed Weights    10/02/19 0421 10/03/19 0600 10/05/19 0456  Weight: 93.8 kg 92.9 kg 91.7 kg    Labs & Radiologic Studies    CBC Recent Labs    10/02/19 1830 10/03/19 0322  WBC 6.4 5.9  HGB 14.3 14.0  HCT 42.4 42.5  MCV 86.7 88.9  PLT 212 017   Basic Metabolic Panel Recent Labs    10/03/19 0322  NA 137  K 3.8  CL 106  CO2 22  GLUCOSE 171*  BUN 7  CREATININE 1.04  CALCIUM 8.6*   Liver Function Tests Lab Results  Component Value Date   ALT 31 08/29/2017   AST 30 08/29/2017   ALKPHOS 78 08/29/2017   BILITOT 0.7 08/29/2017    High Sensitivity Troponin:   Recent Labs  Lab 10/01/19 2232 10/02/19 0016 10/02/19 0551  TROPONINIHS 185* 665* 26,933*    Hemoglobin A1C Lab Results  Component Value Date   HGBA1C 5.6 10/01/2019    Fasting Lipid Panel Lab Results  Component Value Date   CHOL 323 (H) 10/02/2019   HDL 47 10/02/2019   LDLCALC 241 (H) 10/02/2019   TRIG 177 (H) 10/02/2019   CHOLHDL 6.9 10/02/2019    Thyroid Function Tests Lab Results  Component Value Date   TSH 1.807 10/02/2019   _____________  DG Chest 2 View  Result Date: 10/01/2019 CLINICAL DATA:  Chest pain EXAM: CHEST - 2 VIEW COMPARISON:  None. FINDINGS: The heart size and mediastinal contours are within normal limits. Both lungs are clear. Mild degenerative changes of the spine. IMPRESSION: No active cardiopulmonary disease. Electronically Signed   By: Donavan Foil M.D.   On: 10/01/2019 22:45   CARDIAC CATHETERIZATION  Result Date: 10/02/2019  Mid LAD lesion is 99% stenosed.  A drug-eluting stent was successfully placed using a SYNERGY XD 3.0X20.  Post intervention, there is a 0% residual stenosis.  Edward Pierce is a 54 y.o. male  [id number] LOCATION:  FACILITY: [facility] PHYSICIAN: Quay Burow, M.D. March 12, 1966 DATE OF PROCEDURE:  10/02/2019 DATE OF DISCHARGE: CARDIAC CATHETERIZATION / PCI DES LAD History obtained from chart review.  Mr. Wrede  is a 54 year old thin appearing married Caucasian male without  prior cardiac history.  Does have a family history of heart disease and hyperlipidemia not on statin therapy.  He developed chest pain yesterday was brought to the emergency room early yesterday evening.  His EKG showed no acute changes although he did have bundle branch block on and off throughout the night as well as runs of AI VR/nonsustained ventricular tachycardia.  He is placed on IV heparin and nitroglycerin.  His enzymes rose to greater than 27,000.  Because of his arrhythmias and ongoing mild chest pain he was brought to the cardiac catheterization laboratory earlier this morning for angiography and potential intervention. PROCEDURE DESCRIPTION: The patient was brought to the second floor [facility] Cardiac cath lab in the postabsorptive state. He was premedicated with IV Versed and fentanyl. His right wrist was prepped and shaved in usual sterile fashion. Xylocaine 1% was used for local anesthesia.  A 6 French sheath was inserted into the right radial artery using standard Seldinger technique. The patient received 5000 units  of heparin intravenously.  5 [place] TIG catheter and pigtail catheters were used for selective coronary angiography and obtain left heart pressures.  Isovue dye was used for the entirety of the case.  Retrograde aorta, ventricular and pullback pressures were recorded.  Radial cocktail was administered via the SideArm sheath. Patient had ruptured plaque in the mid LAD with heavy thrombus burden.  The decision was made to proceed with PCI drug-eluting stenting with aspiration thrombectomy.  Patient received an additional 6 sessions of heparin with an ACT of 356.  He received Brilinta 180 mg as well as Aggrastat bolus and infusion. Using a 6 [place] XB LAD 3.5 cm guide catheter along with a 0.14 [place] guidewire and a Pronto P4 aspiration thrombectomy device multiple passes was performed across the thrombotic lesion in the mid LAD ultimately improving angiographic appearance and  aspirating significant thrombus burden.  Following this direct stenting was performed with a 3 mm x 20 mm long Synergy drug-eluting stent deployed at 14 atm.  This was postdilated with a 3.25 x 15 mm long noncompliant balloon at 16 atm (3.3 mm) resulting reduction of the total occlusion to 0% residual.  There was TIMI I 2 flow prior to intervention and TIMI-3 flow post intervention.  The patient tolerated the procedure well.  The guidewire and catheter were removed.  The sheath was removed and a TR band was placed on the right wrist to achieve patent hemostasis.   Successful mid LAD aspiration thrombectomy followed by PCI and drug-eluting stenting of a subtotally occluded highly thrombotic mid LAD stenosis.  He had no other significant CAD.  Visual inspection of his 2D echo revealed an EF in the 25 to 30% range with anteroapical akinesia.  He will need dual antiplatelet therapy uninterrupted for 12 months as well as 18 hours of IV Aggrastat.  He would benefit from being on a LifeVest for 3 months given his LAD infarct, severe LV dysfunction and arrhythmias overnight.  Otherwise, he will need guideline directed optimal medical therapy including high-dose statin, beta-blocker, ACE inhibitor as well.  He left the lab in stable condition. Quay Burow. MD, [facility] 10/02/2019 11:27 AM   ECHOCARDIOGRAM COMPLETE  Result Date: 10/02/2019    ECHOCARDIOGRAM REPORT   Patient Name:   JOSEMANUEL EAKINS [facility] Date of Exam: 10/02/2019 Medical Rec #:  [id number]     Height:       71.0 in Accession #:    [id number]    Weight:       206.8 lb Date of Birth:  07-15-1965      BSA:          2.139 m Patient Age:    54 years      BP:           143/88 mmHg Patient Gender: M             HR:           69 bpm. Exam Location:  Inpatient Procedure: 2D Echo, Cardiac Doppler and Color Doppler Indications:    NSTEMI  History:        Patient has no prior history of Echocardiogram examinations.                 Acute MI, Signs/Symptoms:Chest Pain; Risk  Factors:Hypertension,                 Dyslipidemia  and Current Smoker.  Sonographer:    Dustin Flock Referring Phys: [id number] [place]  1. Left ventricular ejection fraction, by estimation, is 35%. The left ventricle has moderately decreased function. The left ventricle demonstrates regional wall motion abnormalities with mid to apical anteroseptal and inferoseptal akinesis. The apical inferior wall, apical anterior wall, and the true apex are akinetic. No LV thrombus. Left ventricular diastolic parameters are consistent with Grade I diastolic dysfunction (impaired relaxation).  2. Right ventricular systolic function is normal. The right ventricular size is normal. Tricuspid regurgitation signal is inadequate for assessing PA pressure.  3. The mitral valve is normal in structure. Trivial mitral valve regurgitation. No evidence of mitral stenosis.  4. The aortic valve is tricuspid. Aortic valve regurgitation is not visualized. No aortic stenosis is present.  5. The inferior vena cava is normal in size with greater than 50% respiratory variability, suggesting right atrial pressure of 3 mmHg. FINDINGS  Left Ventricle: Left ventricular ejection fraction, by estimation, is 35%. The left ventricle has moderately decreased function. The left ventricle demonstrates regional wall motion abnormalities. The left ventricular internal cavity size was normal in size. There is no left ventricular hypertrophy. Left ventricular diastolic parameters are consistent with Grade I diastolic dysfunction (impaired relaxation). Right Ventricle: The right ventricular size is normal. No increase in right ventricular wall thickness. Right ventricular systolic function is normal. Tricuspid regurgitation signal is inadequate for assessing PA pressure. Left Atrium: Left atrial size was normal in size. Right Atrium: Right atrial size was normal in size. Pericardium: There is no evidence of pericardial effusion. Mitral Valve:  The mitral valve is normal in structure. Trivial mitral valve regurgitation. No evidence of mitral valve stenosis. Tricuspid Valve: The tricuspid valve is normal in structure. Tricuspid valve regurgitation is not demonstrated. Aortic Valve: The aortic valve is tricuspid. Aortic valve regurgitation is not visualized. No aortic stenosis is present. Pulmonic Valve: The pulmonic valve was normal in structure. Pulmonic valve regurgitation is not visualized. Aorta: The aortic root is normal in size and structure. Venous: The inferior vena cava is normal in size with greater than 50% respiratory variability, suggesting right atrial pressure of 3 mmHg. IAS/Shunts: No atrial level shunt detected by color flow Doppler.  LEFT VENTRICLE PLAX 2D LVIDd:         5.00 cm      Diastology LVIDs:         3.30 cm      LV e' lateral:   6.85 cm/s LV PW:         1.10 cm      LV E/e' lateral: 7.9 LV IVS:        1.10 cm      LV e' medial:    5.77 cm/s LVOT diam:     2.20 cm      LV E/e' medial:  9.4 LV SV:         67 LV SV Index:   31 LVOT Area:     3.80 cm  LV Volumes (MOD) LV vol d, MOD A4C: 180.0 ml LV vol s, MOD A4C: 79.8 ml LV SV MOD A4C:     180.0 ml RIGHT VENTRICLE RV Basal diam:  3.10 cm RV S prime:     7.83 cm/s TAPSE (M-mode): 2.0 cm LEFT ATRIUM             Index       RIGHT ATRIUM           Index LA diam:  3.90 cm 1.82 cm/m  RA Area:     11.60 cm LA Vol (A2C):   44.5 ml 20.81 ml/m RA Volume:   25.40 ml  11.88 ml/m LA Vol (A4C):   37.8 ml 17.67 ml/m LA Biplane Vol: 41.9 ml 19.59 ml/m  AORTIC VALVE LVOT Vmax:   86.80 cm/s LVOT Vmean:  58.200 cm/s LVOT VTI:    0.175 m  AORTA Ao Root diam: 3.40 cm MITRAL VALVE MV Area (PHT): 4.31 cm    SHUNTS MV Decel Time: 176 msec    Systemic VTI:  0.18 m MV E velocity: 54.40 cm/s  Systemic Diam: 2.20 cm MV A velocity: 69.00 cm/s MV E/A ratio:  0.79 Loralie Champagne MD Electronically signed by Loralie Champagne MD Signature Date/Time: 10/02/2019/3:43:21 PM    Final    Disposition   Pt is  being discharged home today in improved condition.  Follow-up Plans & Appointments     Discharge Instructions    Amb Referral to Cardiac Rehabilitation   Complete by: As directed    Diagnosis:  Coronary Stents NSTEMI     After initial evaluation and assessments completed: Virtual Based Care may be provided alone or in conjunction with Phase 2 Cardiac Rehab based on patient barriers.: Yes      Discharge Medications   Allergies as of 10/05/2019   No Known Allergies     Medication List    STOP taking these medications   ibuprofen 200 MG tablet Commonly known as: ADVIL     TAKE these medications   aspirin 81 MG EC tablet Take 1 tablet (81 mg total) by mouth daily. Swallow whole.   atorvastatin 80 MG tablet Commonly known as: LIPITOR Take 1 tablet (80 mg total) by mouth at bedtime. What changed:   medication strength  how much to take  additional instructions   carvedilol 3.125 MG tablet Commonly known as: COREG Take 1 tablet (3.125 mg total) by mouth 2 (two) times daily with a meal.   loratadine 10 MG tablet Commonly known as: CLARITIN Take 10 mg by mouth daily as needed for allergies.   losartan 25 MG tablet Commonly known as: COZAAR Take 1 tablet (25 mg total) by mouth daily.   nitroGLYCERIN 0.4 MG SL tablet Commonly known as: NITROSTAT Place 1 tablet (0.4 mg total) under the tongue every 5 (five) minutes as needed for chest pain (CP or SOB).   ticagrelor 90 MG Tabs tablet Commonly known as: BRILINTA Take 1 tablet (90 mg total) by mouth 2 (two) times daily.          Outstanding Labs/Studies   None  Duration of Discharge Encounter   Greater than 30 minutes including physician time.  Signed, Rosaria Ferries, PA-C 10/05/2019, 10:23 AM

## 2019-10-09 ENCOUNTER — Telehealth (HOSPITAL_COMMUNITY): Payer: Self-pay

## 2019-10-09 NOTE — Telephone Encounter (Signed)
Called patient to see if he is interested in the Cardiac Rehab Program. Patient expressed interest. Explained scheduling process and went over insurance, patient verbalized understanding. Will contact patient for scheduling once f/u has been completed.  °

## 2019-10-09 NOTE — Telephone Encounter (Signed)
Pt insurance is active and benefits verified through [place] $0.00, DED $4,000.00/$2,068.88 met, out of pocket $6,850.00/$2,087.48 met, co-insurance 20%. No pre-authorization required. Passport, 10/09/19 @ 2:47PM, YBO#[id number]  Will contact patient to see if he is interested in the Cardiac Rehab Program. If interested, patient will need to complete follow up appt. Once completed, patient will be contacted for scheduling upon review by the RN Navigator.

## 2019-10-11 ENCOUNTER — Telehealth: Payer: Self-pay | Admitting: Cardiology

## 2019-10-11 NOTE — Telephone Encounter (Signed)
FMLA paperwork was left in Dr.Hardings mailbox on 10/11/2019.

## 2019-10-14 ENCOUNTER — Telehealth: Payer: Self-pay | Admitting: Cardiology

## 2019-10-14 NOTE — Telephone Encounter (Signed)
FORWARD TO DR [facility] AND NURSE

## 2019-10-14 NOTE — Telephone Encounter (Signed)
RECEIVED FORM . IN PROCESS OF FILLING OUT INFORMATION

## 2019-10-14 NOTE — Telephone Encounter (Signed)
Din from [employer] is calling to confirm if the office received disability forms for this patient for his claim. She states it was sent on 7/29 to fax number [phone].  Din provided the claim ID # for reference for callback - [id number]. She is also requesting to be provided with the ICD-10 code and the procedure code. Please advise.

## 2019-10-16 NOTE — Telephone Encounter (Signed)
[employer] called to check on the status of the paperwork. Mentioned to [facility] what Ivin Booty Documented 10/14/19.

## 2019-10-21 ENCOUNTER — Other Ambulatory Visit: Payer: Self-pay

## 2019-10-21 ENCOUNTER — Ambulatory Visit (INDEPENDENT_AMBULATORY_CARE_PROVIDER_SITE_OTHER): Payer: BC Managed Care – PPO | Admitting: Cardiology

## 2019-10-21 VITALS — BP 104/60 | HR 57 | Ht 72.0 in | Wt 202.2 lb

## 2019-10-21 DIAGNOSIS — E785 Hyperlipidemia, unspecified: Secondary | ICD-10-CM | POA: Diagnosis not present

## 2019-10-21 DIAGNOSIS — I25119 Atherosclerotic heart disease of native coronary artery with unspecified angina pectoris: Secondary | ICD-10-CM

## 2019-10-21 DIAGNOSIS — E663 Overweight: Secondary | ICD-10-CM

## 2019-10-21 DIAGNOSIS — Z955 Presence of coronary angioplasty implant and graft: Secondary | ICD-10-CM | POA: Diagnosis not present

## 2019-10-21 DIAGNOSIS — I252 Old myocardial infarction: Secondary | ICD-10-CM

## 2019-10-21 DIAGNOSIS — Z9189 Other specified personal risk factors, not elsewhere classified: Secondary | ICD-10-CM

## 2019-10-21 DIAGNOSIS — I5041 Acute combined systolic (congestive) and diastolic (congestive) heart failure: Secondary | ICD-10-CM

## 2019-10-21 DIAGNOSIS — I472 Ventricular tachycardia, unspecified: Secondary | ICD-10-CM

## 2019-10-21 DIAGNOSIS — I255 Ischemic cardiomyopathy: Secondary | ICD-10-CM

## 2019-10-21 NOTE — Patient Instructions (Signed)
Medication Instructions:  *no changes   *If you need a refill on your cardiac medications before your next appointment, please call your pharmacy*   Lab Work: Not needed    Testing/Procedures: Will be schedule at [facility]  [address]- late Oct @ 10/ 21/21  Your physician has requested that you have an echocardiogram. Echocardiography is a painless test that uses sound waves to create images of your heart. It provides your doctor with information about the size and shape of your heart and how well your heart's chambers and valves are working. This procedure takes approximately one hour. There are no restrictions for this procedure.     Follow-Up: At [facility], you and your health needs are our priority.  As part of our continuing mission to provide you with exceptional heart care, we have created designated Provider Care Teams.  These Care Teams include your primary Cardiologist (physician) and Advanced Practice Providers (APPs -  Physician Assistants and Nurse Practitioners) who all work together to provide you with the care you need, when you need it.  We recommend signing up for the patient portal called "MyChart".  Sign up information is provided on this After Visit Summary.  MyChart is used to connect with patients for Virtual Visits (Telemedicine).  Patients are able to view lab/test results, encounter notes, upcoming appointments, etc.  Non-urgent messages can be sent to your provider as well.   To learn more about what you can do with MyChart, go to [email or web address].    Your next appointment:   3 month(s) Nov 2021  The format for your next appointment:   In Person  Provider:   Glenetta Hew, MD   Other Instructions   may start cardiac rehab next Wednesday - Oct 30, 2019 May return to work Sept 3, 2021

## 2019-10-21 NOTE — Progress Notes (Signed)
Primary Care Provider: Lorrene Reid, PA-C Cardiologist: Glenetta Hew, MD Electrophysiologist: None  Clinic Note: Chief Complaint  Patient presents with  . Hospitalization Follow-up    Acute Ant NSTEMI - Vtack/AIVR  . Coronary Artery Disease    no more angina  . Cardiomyopathy    Ischemic - no CHF Sx; No LifeVest shocks    HPI:   Plan Edward Pierce is a 54 y.o. male with a prior history of hypertension, hyperlipidemia/hyper triglyceridemia and remote tobacco abuse who presents today for ~2-week hospital follow-up after admission with acute anterior non-STEMI (aborted STEMI) complicated by sustained VT.  This is his first f/u appt.  Recent Hospitalizations: 7/20-24/2021 (Anterior NSTEMI / Sustained VT -> Thrombotic Subtotal LAD -> thrombectomy (large red clot) - DES PCI, Severely reduced LVEF --> d/c with LifeVest; BP did not tolerate more than low dose Toprol & 25 mg Losartan. (no Entresto).  Darryl Lent presented in the evening of July 20 for evaluation of chest discomfort and elevated blood pressure.  He did subsequently ruled in for non-ST elevation MI with significantly climbing troponin levels.  Overnight in the ICU, he had bouts of AI VR and nonsustained VT treated with IV amiodarone.  He was "chest pain-free" throughout the night, but was still uncomfortable in the morning of July 21 -->   taken first case to the Cath Lab where he was found to have 99% thrombotic occlusion of the LAD treated with aspiration thrombectomy and DES. -->    Amiodarone disc converted to low-dose beta-blocker; started on low-dose losartan with consideration of Entresto in the outpatient.  Reviewed  CV studies:    The following studies were reviewed today: (if available, images/films reviewed: From Epic Chart or Care Everywhere)  Cardiac Cath-PCI 10/02/2019:Culprit lesion mid LAD 99% heavily thrombotic lesion-->aspiration thrombectomy, DES PCI Synergy 3.0 x 23. 3.3 mm). Otherwise essentially  normal coronaries with RI, LCx and RCA free of significant disease-RCA has mild proximal 20%.. Relatively normal LVEDP 7 mmHg.   TTE 10/02/2019: (Post anterior MI) EF roughly 35%. Mid-apical anteroseptal and inferoseptal akinesis. Apical inferior apical anterior and apical akinesis. GR 1 DD. Otherwise normal valves, normal RV and atriae  Interval History:   Edward Pierce is here today for his first hospital follow-up stating that he is 20 pretty well.  He has been up to walking up to 20 minutes at a time 2 times a day.  He has not had any chest pain or pressure associated with it.  With decreased exercise and change in diet, he has lost about 12 pounds since discharge.  Feels a whole lot better.  Energy is gradually improving.  Not as fast as he would like for it to improve.  He does not really describe it as fatigue, just a little bit more than expected exercise intolerance.  One time he felt his heart rate speed up just a little bit for a few seconds but that is about it, no prolonged rapid regular heartbeats or palpitations.  He has not had any heart failure or angina symptoms.  CV Review of Symptoms (Summary) Cardiovascular ROS: no chest pain or dyspnea on exertion positive for - A little bit of fatigue, a few short bursts of fast heart rate but nothing prolonged.  1 episode of niggling pinprick chest discomfort negative for - edema, irregular heartbeat, orthopnea, paroxysmal nocturnal dyspnea, shortness of breath or Syncope/near syncope, TIA/amaurosis fugax, claudication: Melena, hematochezia, hematuria or epistaxis.  The patient does not have symptoms concerning  for COVID-19 infection (fever, chills, cough, or new shortness of breath).  The patient is practicing social distancing & Masking.   He had been undecided about whether or not he should get the Covid vaccine; was not sure what to do now that he has had his heart attack.  REVIEWED OF SYSTEMS   Review of Systems   Constitutional: Positive for weight loss (12 pounds). Negative for malaise/fatigue (Just a little bit tired).  HENT: Negative for congestion and nosebleeds.   Respiratory: Negative for cough and shortness of breath.   Gastrointestinal: Negative for blood in stool and melena.  Genitourinary: Negative for hematuria.  Musculoskeletal: Positive for joint pain. Negative for falls and myalgias.  Neurological: Negative for dizziness, focal weakness and headaches.  Endo/Heme/Allergies: Does not bruise/bleed easily.  Psychiatric/Behavioral: Negative.    I have reviewed and (if needed) personally updated the patient's problem list, medications, allergies, past medical and surgical history, social and family history.   PAST MEDICAL HISTORY   Past Medical History:  Diagnosis Date  . Coronary artery disease involving native coronary artery of native heart with angina pectoris ([place]) 10/03/2019   Cardiac Cath-PCI 10/02/2019:Culprit lesion mid LAD 99% heavily thrombotic lesion-->aspiration thrombectomy, DES PCI Synergy 3.0 x 23. 3.3 mm). Otherwise essentially normal coronaries with RI, LCx and RCA free of significant disease-RCA has mild proximal 20%.. Relatively normal LVEDP 7 mmHg.  Marland Kitchen History of acute anterior wall MI (did not meet full STEMI criteria) 10/02/2019   associated with AIVR & Sustained VT - ? reperfusion / ischemic arrhythmias  . Hyperlipidemia with target LDL less than 70 01/19/2016  . Hypertriglyceridemia, familial 01/19/2016  . Ischemic cardiomyopathy 10/03/2019   TTE 10/02/2019: (Post anterior MI) EF roughly 35%. Mid-apical anteroseptal and inferoseptal akinesis. Apical inferior apical anterior and apical akinesis. GR 1 DD.   Marland Kitchen Sustained ventricular tachycardia ([place]) 10/03/2019   Admitted for ACS-troponin elevation, EKG with borderline anterior STEMI findings.  Had AIVR and sustained VT during stabilization overnight.    PAST SURGICAL HISTORY   Past Surgical History:  Procedure  Laterality Date  . CORONARY STENT INTERVENTION N/A 10/02/2019   Procedure: CORONARY STENT INTERVENTION;  Surgeon: Lorretta Harp, MD;  Location: [place] [facility]; Culprit lesion mid LAD 99% heavily thrombotic lesion-->aspiration thrombectomy, DES PCI Synergy 3.0 x 23. 3.3 mm).   . CORONARY THROMBECTOMY N/A 10/02/2019   Procedure: Coronary Thrombectomy;  Surgeon: Lorretta Harp, MD;  Location: [place] [facility];  Service: Cardiovascular;; Heavily thrombotic 99% mLAD subTotal occlusion - Aspiration Thrombectomy of Large Red Thrombus  . GUM SURGERY  1979   gum graft  . HERNIA REPAIR     right side  . LEFT HEART CATH AND CORONARY ANGIOGRAPHY N/A 10/02/2019   Procedure: LEFT HEART CATH AND CORONARY ANGIOGRAPHY;  Surgeon: Lorretta Harp, MD;  Location: [place] [facility];  Service: Cardiovascular;; Culprit lesion mid LAD 99% heavily thrombotic lesion-->aspiration thrombectomy, DES PCI. Otherwise essentially normal coronaries with RI, LCx and RCA free of significant disease-RCA has mild proximal 20%.. Relatively normal LVEDP 7 mmHg.  Marland Kitchen TRANSTHORACIC ECHOCARDIOGRAM  10/02/2019    (Post anterior MI) EF roughly 35%. Mid-apical anteroseptal and inferoseptal akinesis. Apical inferior apical anterior and apical akinesis. GR 1 DD. Otherwise normal valves, normal RV and atriae    MEDICATIONS/ALLERGIES   Current Meds  Medication Sig  . aspirin EC 81 MG EC tablet Take 1 tablet (81 mg total) by mouth daily. Swallow whole.  Marland Kitchen atorvastatin (LIPITOR) 80 MG tablet Take 1  tablet (80 mg total) by mouth at bedtime.  . carvedilol (COREG) 3.125 MG tablet Take 1 tablet (3.125 mg total) by mouth 2 (two) times daily with a meal.  . loratadine (CLARITIN) 10 MG tablet Take 10 mg by mouth daily as needed for allergies.  Marland Kitchen losartan (COZAAR) 25 MG tablet Take 1 tablet (25 mg total) by mouth daily.  . nitroGLYCERIN (NITROSTAT) 0.4 MG SL tablet Place 1 tablet (0.4 mg total) under the tongue every 5 (five)  minutes as needed for chest pain (CP or SOB).  . ticagrelor (BRILINTA) 90 MG TABS tablet Take 1 tablet (90 mg total) by mouth 2 (two) times daily.    No Known Allergies  SOCIAL HISTORY/FAMILY HISTORY   Social History   Tobacco Use  . Smoking status: Former Smoker    Packs/day: 0.25    Years: 13.00    Pack years: 3.25    Types: Cigarettes    Quit date: 02/12/1996    Years since quitting: 23.7  . Smokeless tobacco: Never Used  Substance Use Topics  . Alcohol use: Yes    Alcohol/week: 3.0 standard drinks    Types: 1 Glasses of wine, 2 Cans of beer per week  . Drug use: No   Social History   Social History Narrative  . Not on file   Family History  Problem Relation Age of Onset  . Cancer Mother        breast  . Diabetes Mother   . Hypertension Mother   . Heart disease Mother        CAD  . Breast cancer Mother   . Hyperlipidemia Father   . COPD Father   . Heart disease Father        CAD  . Heart attack Paternal Grandfather     OBJCTIVE -PE, EKG, labs   Wt Readings from Last 3 Encounters:  10/21/19 202 lb 3.2 oz (91.7 kg)  10/05/19 202 lb 3.2 oz (91.7 kg)  10/31/17 206 lb (93.4 kg)    Physical Exam: BP 104/60   Pulse (!) 57   Ht 6' (1.829 m)   Wt 202 lb 3.2 oz (91.7 kg)   BMI 27.42 kg/m  Physical Exam Vitals reviewed.  Constitutional:      General: He is not in acute distress.    Appearance: Normal appearance. He is normal weight. He is not ill-appearing.     Comments: Well-groomed.  Looks healthy  HENT:     Head: Normocephalic and atraumatic.  Neck:     Comments: No JVD or carotid bruit Cardiovascular:     Rate and Rhythm: Regular rhythm.     Pulses: Normal pulses.     Heart sounds: Normal heart sounds. No murmur heard.  No friction rub. No gallop.      Comments: Nondisplaced PMI. Pulmonary:     Effort: Pulmonary effort is normal. No respiratory distress.     Breath sounds: Normal breath sounds.  Chest:     Chest wall: No tenderness.   Musculoskeletal:        General: No swelling. Normal range of motion.     Cervical back: Normal range of motion and neck supple.  Neurological:     General: No focal deficit present.     Mental Status: He is alert and oriented to person, place, and time.  Psychiatric:        Mood and Affect: Mood normal.        Behavior: Behavior normal.  Thought Content: Thought content normal.        Judgment: Judgment normal.     Adult ECG Report  Rate: 57;  Rhythm: sinus bradycardia and Anteroseptal MI, age undetermined.  T wave inversions with ST segment changes consistent with evolutionary changes of anterior MI.;   Narrative Interpretation: Stable EKG.  Recent Labs:    Lab Results  Component Value Date   CHOL 323 (H) 10/02/2019   HDL 47 10/02/2019   LDLCALC 241 (H) 10/02/2019   TRIG 177 (H) 10/02/2019   CHOLHDL 6.9 10/02/2019   Lab Results  Component Value Date   CREATININE 1.04 10/03/2019   BUN 7 10/03/2019   NA 137 10/03/2019   K 3.8 10/03/2019   CL 106 10/03/2019   CO2 22 10/03/2019   Lab Results  Component Value Date   TSH 1.807 10/02/2019    ASSESSMENT/PLAN    Problem List Items Addressed This Visit    Coronary artery disease involving native coronary artery of native heart with angina pectoris ([place]) (Chronic)    Essentially single-vessel disease now with PCI to the LAD.  No further angina, and is active as he is with no heart failure symptoms, I suspect his EF may very well be improving.  Plan:  1 year of DAPT: Aspirin and Brilinta  Atorvastatin -> with potential additional lipid control  Beta-blocker (unable to titrate further because of heart rate 57)  ARB-if pressure tolerates, would consider converting to Entresto if EF remains down, but currently blood pressure would not tolerate.      Relevant Orders   EKG 12-Lead (Completed)   ECHOCARDIOGRAM COMPLETE   Presence of drug coated stent in LAD coronary artery - Primary (Chronic)    DAPT for minimum  1 year.  Would probably continue Brilinta at reduced maintenance dose of 60 mg twice daily for least 1 more year and then potentially lifelong.      Relevant Orders   EKG 12-Lead (Completed)   ECHOCARDIOGRAM COMPLETE   Ischemic cardiomyopathy (Chronic)    Hopefully his EF will improve.  Echo showed EF of 30 to 35%.  Unable to titrate beyond carvedilol 3.125 twice daily and losartan 25 mg daily. Euvolemic and therefore does not require diuretic, although we could consider spironolactone.  Plan is to recheck echo in end of October to determine if EF is improved.      Relevant Orders   EKG 12-Lead (Completed)   ECHOCARDIOGRAM COMPLETE   Acute combined systolic and diastolic heart failure (HCC) (Chronic)    Interestingly, his presentation was initially MI followed by arrhythmia with some reperfusion changes.  Despite having a significantly reduced EF and elevated LVEDP on the LV gram and hemodynamics, he did not really have much in the way of any heart failure symptoms in the hospital, and has not had any heart failure symptoms.  Gentle diuresis in the hospital but otherwise auto diuresis after reperfusion.  He was started on low-dose beta-blocker that was not able to be titrated beyond Coreg 3.125 mg twice daily. Only able to tolerate minimal dose losartan 25 mg daily. He was euvolemic on exam and therefore was not discharged on diuretic.      Relevant Orders   EKG 12-Lead (Completed)   ECHOCARDIOGRAM COMPLETE   History of acute anterior wall MI (did not meet full STEMI criteria) (Chronic)    He took a pretty big hit from his anterior MI based on troponin elevation and echocardiogram findings.  Fortunately, he seems to have recovered symptomatically  quite a bit.  He does have low blood pressures that limit titration of Guideline Directed Medical Therapy.  I have suggested that he stay out of work until early September.  He works for [employer] and was supposed to have some work trip scheduled.   No think it safe for him to fly until we reevaluate his EF.  He is already canceled his trip to [place].  Cardiac Rehab referral placed.      Hyperlipidemia with target LDL less than 70 (Chronic)    Very poorly controlled lipids as of July.  I suspect he is in a probably need much more than high-dose atorvastatin.  We will reassess when he comes in for his follow-up echocardiogram. He had not been on statin because of concern for interaction with alcohol consumption.  Suspect he will need PCSK9 inhibitor      Overweight (BMI 25.0-29.9) (Chronic)    He lost 12 pounds since discharge and is therefore out of the "obesity "range.  Continue to encourage exercise and adjusted diet.      Sustained ventricular tachycardia (HCC)    Thankfully, it does not sound like he has had any further arrhythmias.   LifeVest in place. On beta-blocker.      Relevant Orders   EKG 12-Lead (Completed)   ECHOCARDIOGRAM COMPLETE   At risk for sudden cardiac death    Presentation was as an acute anterior MI although did not meet STEMI criteria, he still had bouts of sustained VT and AI VR in conjunction with significantly reduced EF on echo post PCI.  Currently has LifeVest in place. Blood pressure limits titration of medication but he remains on carvedilol and losartan.  Plan to recheck an echocardiogram end of October to reassess EF and determine if he needs referral for ICD (if EF remains less than 35%.)      Relevant Orders   EKG 12-Lead (Completed)   ECHOCARDIOGRAM COMPLETE       COVID-19 Education: The signs and symptoms of COVID-19 were discussed with the patient and how to seek care for testing (follow up with PCP or arrange E-visit).   The importance of social distancing and COVID-19 vaccination was discussed today. -->  Recommended vaccine  I spent a total of 66minutes with the patient. >  50% of the time was spent in direct patient consultation.  -> Multiple questions including the  question about COVID-19 vaccine.  We reviewed cardiac cath Images/report as well as echo results.  I discussed the usual natural history this presentation.  We discussed cardiac rehab. Additional time spent with chart review  / charting (studies, outside notes, etc): 15 --> Cath films reviewed and echo reviewed.  DC summary reviewed and medical/surgical history updated Total Time: 48 min   Current medicines are reviewed at length with the patient today.  (+/- concerns) he had questions about COVID-19 vaccine, and also going back to work.  Notice: This dictation was prepared with Dragon dictation along with smaller phrase technology. Any transcriptional errors that result from this process are unintentional and may not be corrected upon review.  Patient Instructions / Medication Changes & Studies & Tests Ordered   Patient Instructions  Medication Instructions:  *no changes   *If you need a refill on your cardiac medications before your next appointment, please call your pharmacy*   Lab Work: Not needed    Testing/Procedures: Will be schedule at [facility]  [address]- late Oct @ 10/ 21/21  Your physician has requested that  you have an echocardiogram. Echocardiography is a painless test that uses sound waves to create images of your heart. It provides your doctor with information about the size and shape of your heart and how well your heart's chambers and valves are working. This procedure takes approximately one hour. There are no restrictions for this procedure.     Follow-Up: At [facility], you and your health needs are our priority.  As part of our continuing mission to provide you with exceptional heart care, we have created designated Provider Care Teams.  These Care Teams include your primary Cardiologist (physician) and Advanced Practice Providers (APPs -  Physician Assistants and Nurse Practitioners) who all work together to provide you with the care you need,  when you need it.  We recommend signing up for the patient portal called "MyChart".  Sign up information is provided on this After Visit Summary.  MyChart is used to connect with patients for Virtual Visits (Telemedicine).  Patients are able to view lab/test results, encounter notes, upcoming appointments, etc.  Non-urgent messages can be sent to your provider as well.   To learn more about what you can do with MyChart, go to [email or web address].    Your next appointment:   3 month(s) Nov 2021  The format for your next appointment:   In Person  Provider:   Glenetta Hew, MD   Other Instructions   may start cardiac rehab next Wednesday - Oct 30, 2019 May return to work Sept 3, 2021     Studies Ordered:   Orders Placed This Encounter  Procedures  . EKG 12-Lead  . ECHOCARDIOGRAM COMPLETE     Glenetta Hew, M.D., M.S. Interventional Cardiologist   Pager # [phone] Phone # [phone] [address] [place], [place] [address]   Thank you for choosing Heartcare at [facility]!!

## 2019-10-23 ENCOUNTER — Encounter: Payer: Self-pay | Admitting: Cardiology

## 2019-10-23 NOTE — Assessment & Plan Note (Signed)
He lost 12 pounds since discharge and is therefore out of the "obesity "range.  Continue to encourage exercise and adjusted diet.

## 2019-10-23 NOTE — Assessment & Plan Note (Signed)
DAPT for minimum 1 year.  Would probably continue Brilinta at reduced maintenance dose of 60 mg twice daily for least 1 more year and then potentially lifelong.

## 2019-10-23 NOTE — Telephone Encounter (Signed)
Received a call from [facility] with [employer].She wanted to verify dates of recent hospital admission.Advised patient admitted on 10/01/19 discharged 10/05/19.Cardiac cath with PCI and echo done on 10/02/19.Patient had a post hospital follow up with Dr.[name] 10/21/19. Next appointment in 3 months.

## 2019-10-23 NOTE — Assessment & Plan Note (Signed)
He took a pretty big hit from his anterior MI based on troponin elevation and echocardiogram findings.  Fortunately, he seems to have recovered symptomatically quite a bit.  He does have low blood pressures that limit titration of Guideline Directed Medical Therapy.  I have suggested that he stay out of work until early September.  He works for [employer] and was supposed to have some work trip scheduled.  No think it safe for him to fly until we reevaluate his EF.  He is already canceled his trip to [place].  Cardiac Rehab referral placed.

## 2019-10-23 NOTE — Assessment & Plan Note (Signed)
Interestingly, his presentation was initially MI followed by arrhythmia with some reperfusion changes.  Despite having a significantly reduced EF and elevated LVEDP on the LV gram and hemodynamics, he did not really have much in the way of any heart failure symptoms in the hospital, and has not had any heart failure symptoms.  Gentle diuresis in the hospital but otherwise auto diuresis after reperfusion.  He was started on low-dose beta-blocker that was not able to be titrated beyond Coreg 3.125 mg twice daily. Only able to tolerate minimal dose losartan 25 mg daily. He was euvolemic on exam and therefore was not discharged on diuretic.

## 2019-10-23 NOTE — Assessment & Plan Note (Signed)
Essentially single-vessel disease now with PCI to the LAD.  No further angina, and is active as he is with no heart failure symptoms, I suspect his EF may very well be improving.  Plan:  1 year of DAPT: Aspirin and Brilinta  Atorvastatin -> with potential additional lipid control  Beta-blocker (unable to titrate further because of heart rate 57)  ARB-if pressure tolerates, would consider converting to Entresto if EF remains down, but currently blood pressure would not tolerate.

## 2019-10-23 NOTE — Assessment & Plan Note (Signed)
Presentation was as an acute anterior MI although did not meet STEMI criteria, he still had bouts of sustained VT and AI VR in conjunction with significantly reduced EF on echo post PCI.  Currently has LifeVest in place. Blood pressure limits titration of medication but he remains on carvedilol and losartan.  Plan to recheck an echocardiogram end of October to reassess EF and determine if he needs referral for ICD (if EF remains less than 35%.)

## 2019-10-23 NOTE — Assessment & Plan Note (Signed)
>>  ASSESSMENT AND PLAN FOR HYPERLIPIDEMIA WITH TARGET LDL LESS THAN 70 WRITTEN ON 10/23/2019  2:50 AM BY [name], Piedad Climes, MD  Very poorly controlled lipids as of July.  I suspect he is in a probably need much more than high-dose atorvastatin.  We will reassess when he comes in for his follow-up echocardiogram. He had not been on statin because of concern for interaction with alcohol consumption.  Suspect he will need PCSK9 inhibitor

## 2019-10-23 NOTE — Assessment & Plan Note (Signed)
Hopefully his EF will improve.  Echo showed EF of 30 to 35%.  Unable to titrate beyond carvedilol 3.125 twice daily and losartan 25 mg daily. Euvolemic and therefore does not require diuretic, although we could consider spironolactone.  Plan is to recheck echo in end of October to determine if EF is improved.

## 2019-10-23 NOTE — Assessment & Plan Note (Signed)
Thankfully, it does not sound like he has had any further arrhythmias.   LifeVest in place. On beta-blocker.

## 2019-10-23 NOTE — Assessment & Plan Note (Signed)
Very poorly controlled lipids as of July.  I suspect he is in a probably need much more than high-dose atorvastatin.  We will reassess when he comes in for his follow-up echocardiogram. He had not been on statin because of concern for interaction with alcohol consumption.  Suspect he will need PCSK9 inhibitor

## 2019-10-28 ENCOUNTER — Telehealth (HOSPITAL_COMMUNITY): Payer: Self-pay

## 2019-10-28 NOTE — Telephone Encounter (Signed)
Pt called and stated he is interested in CR. Patient will come in for orientation on 11/28/19 @ 10AM and will attend the 9AM exercise class.  Mailed letter

## 2019-11-05 DIAGNOSIS — I513 Intracardiac thrombosis, not elsewhere classified: Secondary | ICD-10-CM | POA: Diagnosis not present

## 2019-11-05 DIAGNOSIS — I214 Non-ST elevation (NSTEMI) myocardial infarction: Secondary | ICD-10-CM | POA: Diagnosis not present

## 2019-11-05 DIAGNOSIS — I447 Left bundle-branch block, unspecified: Secondary | ICD-10-CM | POA: Diagnosis not present

## 2019-11-05 DIAGNOSIS — Z9861 Coronary angioplasty status: Secondary | ICD-10-CM | POA: Diagnosis not present

## 2019-11-13 ENCOUNTER — Telehealth (HOSPITAL_COMMUNITY): Payer: Self-pay | Admitting: Student-PharmD

## 2019-11-13 NOTE — Telephone Encounter (Signed)
Cardiac Rehab Medication Review by a Pharmacist  Does the patient  feel that his/her medications are working for him/her?  yes  Has the patient been experiencing any side effects to the medications prescribed?  no  Does the patient measure his/her own blood pressure or blood glucose at home?  yes - 108/62 this morning (systolics run in 289T-915W)  Does the patient have any problems obtaining medications due to transportation or finances?   no  Understanding of regimen: good Understanding of indications: good Potential of compliance: good  Patient has no complaints, concerns, or questions at this time. He states that he received his first COVID vaccine [employer] yesterday) and is due for the second dose 9/21. Encouraged him to bring his vaccine card so it can be updated in his chart.   Pharmacist Intervention: patient stated he was no longer taking Claritin so I removed it from his list   Christy Gentles 11/13/2019 6:22 PM

## 2019-11-21 ENCOUNTER — Other Ambulatory Visit: Payer: Self-pay | Admitting: Student

## 2019-11-21 ENCOUNTER — Other Ambulatory Visit: Payer: Self-pay

## 2019-11-21 NOTE — Telephone Encounter (Signed)
This is Dr. [name]'s pt. °

## 2019-11-26 ENCOUNTER — Encounter (HOSPITAL_COMMUNITY)
Admission: RE | Admit: 2019-11-26 | Discharge: 2019-11-26 | Disposition: A | Discharge status: Home / Self Care | Payer: BC Managed Care – PPO | Source: Ambulatory Visit | Attending: Cardiology | Admitting: Cardiology

## 2019-11-26 ENCOUNTER — Telehealth (HOSPITAL_COMMUNITY): Payer: Self-pay | Admitting: *Deleted

## 2019-11-26 ENCOUNTER — Telehealth (HOSPITAL_COMMUNITY): Payer: Self-pay

## 2019-11-26 ENCOUNTER — Other Ambulatory Visit: Payer: Self-pay

## 2019-11-26 DIAGNOSIS — I214 Non-ST elevation (NSTEMI) myocardial infarction: Secondary | ICD-10-CM | POA: Insufficient documentation

## 2019-11-26 DIAGNOSIS — Z955 Presence of coronary angioplasty implant and graft: Secondary | ICD-10-CM | POA: Insufficient documentation

## 2019-11-26 NOTE — Telephone Encounter (Signed)
Cardiac Rehab Telephone Note:  Successful telephone encounter to [facility] to confirm Cardiac Rehab orientation appointment for 11/28/19 at 10:00 am. Nursing assessment could not be completed at this time secondary to patient being out of town. He request call back later today after 3:00 pm. Will follow up with patient at that time per his request.   Jenny Lai E. Rollene Rotunda RN, BSN [place]. [facility]  Cardiac and Pulmonary Rehabilitation Phone: [phone] Fax: [phone]

## 2019-11-26 NOTE — Telephone Encounter (Signed)
Cardiac Rehab Note:  Unsuccessful telephone call to [facility]. [facility] per his previous request to complete cardiac rehab pre-orientation telephone health assessment. Unable to leave [employer] compliant VM as "Mailbox has not been set up". Will attempt to contact patient at a later time prior to CR orientation.  Paulmichael Schreck E. Rollene Rotunda RN, BSN [place]. [facility]  Cardiac and Pulmonary Rehabilitation Phone: [phone] Fax: [phone]

## 2019-11-26 NOTE — Telephone Encounter (Signed)
Spoke with the patient. Completed health history via telephone.Barnet Pall, RN,BSN 11/26/2019 4:23 PM

## 2019-11-28 ENCOUNTER — Encounter (HOSPITAL_COMMUNITY)
Admission: RE | Admit: 2019-11-28 | Discharge: 2019-11-28 | Disposition: A | Discharge status: Home / Self Care | Payer: BC Managed Care – PPO | Source: Ambulatory Visit | Attending: Cardiology | Admitting: Cardiology

## 2019-11-28 ENCOUNTER — Other Ambulatory Visit: Payer: Self-pay

## 2019-11-28 ENCOUNTER — Encounter (HOSPITAL_COMMUNITY): Payer: Self-pay

## 2019-11-28 VITALS — BP 102/60 | HR 67 | Ht 72.25 in | Wt 201.7 lb

## 2019-11-28 DIAGNOSIS — Z955 Presence of coronary angioplasty implant and graft: Secondary | ICD-10-CM

## 2019-11-28 DIAGNOSIS — I214 Non-ST elevation (NSTEMI) myocardial infarction: Secondary | ICD-10-CM

## 2019-11-28 NOTE — Progress Notes (Signed)
Cardiac Individual Treatment Plan  Patient Details  Name: Edward Pierce MRN: [id number] Date of Birth: 1965/09/06 Referring Provider:     CARDIAC REHAB PHASE II ORIENTATION from 11/28/2019 in [place]  Referring Provider Glenetta Hew MD      Initial Encounter Date:    CARDIAC REHAB PHASE II ORIENTATION from 11/28/2019 in [place]  Date 11/28/19      Visit Diagnosis: NSTEMI (non-ST elevated myocardial infarction) ([place]) 10/01/19  S/P DES LAD 10/01/19  Patient's Home Medications on Admission:  Current Outpatient Medications:  .  aspirin EC 81 MG EC tablet, Take 1 tablet (81 mg total) by mouth daily. Swallow whole., Disp: 30 tablet, Rfl: 11 .  atorvastatin (LIPITOR) 80 MG tablet, Take 1 tablet (80 mg total) by mouth at bedtime., Disp: 30 tablet, Rfl: 2 .  carvedilol (COREG) 3.125 MG tablet, TAKE 1 TABLET BY MOUTH TWICE A DAY WITH A MEAL, Disp: 60 tablet, Rfl: 11 .  losartan (COZAAR) 25 MG tablet, TAKE 1 TABLET BY MOUTH EVERY DAY, Disp: 30 tablet, Rfl: 11 .  nitroGLYCERIN (NITROSTAT) 0.4 MG SL tablet, Place 1 tablet (0.4 mg total) under the tongue every 5 (five) minutes as needed for chest pain (CP or SOB)., Disp: 25 tablet, Rfl: 2 .  ticagrelor (BRILINTA) 90 MG TABS tablet, Take 1 tablet (90 mg total) by mouth 2 (two) times daily., Disp: 60 tablet, Rfl: 11  Past Medical History: Past Medical History:  Diagnosis Date  . Coronary artery disease involving native coronary artery of native heart with angina pectoris ([place]) 10/03/2019   Cardiac Cath-PCI 10/02/2019:Culprit lesion mid LAD 99% heavily thrombotic lesion-->aspiration thrombectomy, DES PCI Synergy 3.0 x 23. 3.3 mm). Otherwise essentially normal coronaries with RI, LCx and RCA free of significant disease-RCA has mild proximal 20%.. Relatively normal LVEDP 7 mmHg.  Marland Kitchen History of acute anterior wall MI (did not meet full STEMI criteria) 10/02/2019   associated with  AIVR & Sustained VT - ? reperfusion / ischemic arrhythmias  . Hyperlipidemia with target LDL less than 70 01/19/2016  . Hypertriglyceridemia, familial 01/19/2016  . Ischemic cardiomyopathy 10/03/2019   TTE 10/02/2019: (Post anterior MI) EF roughly 35%. Mid-apical anteroseptal and inferoseptal akinesis. Apical inferior apical anterior and apical akinesis. GR 1 DD.   Marland Kitchen Sustained ventricular tachycardia ([place]) 10/03/2019   Admitted for ACS-troponin elevation, EKG with borderline anterior STEMI findings.  Had AIVR and sustained VT during stabilization overnight.    Tobacco Use: Social History   Tobacco Use  Smoking Status Former Smoker  . Packs/day: 0.25  . Years: 13.00  . Pack years: 3.25  . Types: Cigarettes  . Quit date: 02/12/1996  . Years since quitting: 23.8  Smokeless Tobacco Never Used    Labs: Recent Chemical engineer    Labs for ITP Cardiac and Pulmonary Rehab Latest Ref Rng & Units 01/13/2016 07/18/2016 08/29/2017 10/01/2019 10/02/2019   Cholestrol 0 - 200 mg/dL 290(H) 177 176 - 323(H)   LDLCALC 0 - 99 mg/dL 183(H) 106(H) 87 - 241(H)   HDL >40 mg/dL 32(L) 39(L) 39(L) - 47   Trlycerides <150 mg/dL 374(H) 160(H) 249(H) - 177(H)   Hemoglobin A1c 4.8 - 5.6 % - - 5.6 5.6 -      Capillary Blood Glucose: No results found for: GLUCAP   Exercise Target Goals: Exercise Program Goal: Individual exercise prescription set using results from initial 6 min walk test and THRR while considering  patient's activity barriers and safety.  Exercise Prescription Goal: Starting with aerobic activity 30 plus minutes a day, 3 days per week for initial exercise prescription. Provide home exercise prescription and guidelines that participant acknowledges understanding prior to discharge.  Activity Barriers & Risk Stratification:  Activity Barriers & Cardiac Risk Stratification - 11/28/19 1207      Activity Barriers & Cardiac Risk Stratification   Activity Barriers Back Problems;Arthritis      Cardiac Risk Stratification High           6 Minute Walk:  6 Minute Walk    Row Name 11/28/19 1206         6 Minute Walk   Phase Initial     Distance 1800 feet     Walk Time 6 minutes     # of Rest Breaks 0     MPH 3.4     METS 4.7     RPE 12     Perceived Dyspnea  0     VO2 Peak 16.6     Symptoms No     Resting HR 67 bpm     Resting BP 102/60     Resting Oxygen Saturation  98 %     Exercise Oxygen Saturation  during 6 min walk 99 %     Max Ex. HR 99 bpm     Max Ex. BP 130/80     2 Minute Post BP 122/80            Oxygen Initial Assessment:   Oxygen Re-Evaluation:   Oxygen Discharge (Final Oxygen Re-Evaluation):   Initial Exercise Prescription:  Initial Exercise Prescription - 11/28/19 1200      Date of Initial Exercise RX and Referring Provider   Date 11/28/19    Referring Provider Glenetta Hew MD    Expected Discharge Date 01/24/20      Treadmill   MPH 2.8    Grade 0    Minutes 15    METs 3.14      Bike   Level 1.5    Watts 35    Minutes 15    METs 3      Prescription Details   Frequency (times per week) 3x    Duration Progress to 10 minutes continuous walking  at current work load and total walking time to 30-45 min      Intensity   THRR 40-80% of Max Heartrate 66-133    Ratings of Perceived Exertion 11-13    Perceived Dyspnea 0-4      Progression   Progression Continue progressive overload as per policy without signs/symptoms or physical distress.      Resistance Training   Training Prescription Yes    Weight 4lbs    Reps 10-15           Perform Capillary Blood Glucose checks as needed.  Exercise Prescription Changes:   Exercise Comments:   Exercise Goals and Review:   Exercise Goals    Row Name 11/28/19 1207             Exercise Goals   Increase Physical Activity Yes       Intervention Provide advice, education, support and counseling about physical activity/exercise needs.;Develop an individualized  exercise prescription for aerobic and resistive training based on initial evaluation findings, risk stratification, comorbidities and participant's personal goals.       Expected Outcomes Short Term: Attend rehab on a regular basis to increase amount of physical activity.;Long Term: Add in home exercise to make exercise part of  routine and to increase amount of physical activity.;Long Term: Exercising regularly at least 3-5 days a week.       Increase Strength and Stamina Yes       Intervention Provide advice, education, support and counseling about physical activity/exercise needs.;Develop an individualized exercise prescription for aerobic and resistive training based on initial evaluation findings, risk stratification, comorbidities and participant's personal goals.       Expected Outcomes Short Term: Increase workloads from initial exercise prescription for resistance, speed, and METs.;Short Term: Perform resistance training exercises routinely during rehab and add in resistance training at home;Long Term: Improve cardiorespiratory fitness, muscular endurance and strength as measured by increased METs and functional capacity (6MWT)       Able to understand and use rate of perceived exertion (RPE) scale Yes       Intervention Provide education and explanation on how to use RPE scale       Expected Outcomes Short Term: Able to use RPE daily in rehab to express subjective intensity level;Long Term:  Able to use RPE to guide intensity level when exercising independently       Knowledge and understanding of Target Heart Rate Range (THRR) Yes       Intervention Provide education and explanation of THRR including how the numbers were predicted and where they are located for reference       Expected Outcomes Short Term: Able to state/look up THRR;Long Term: Able to use THRR to govern intensity when exercising independently;Short Term: Able to use daily as guideline for intensity in rehab       Able to check  pulse independently Yes       Intervention Provide education and demonstration on how to check pulse in carotid and radial arteries.;Review the importance of being able to check your own pulse for safety during independent exercise       Expected Outcomes Short Term: Able to explain why pulse checking is important during independent exercise;Long Term: Able to check pulse independently and accurately       Understanding of Exercise Prescription Yes       Intervention Provide education, explanation, and written materials on patient's individual exercise prescription       Expected Outcomes Short Term: Able to explain program exercise prescription;Long Term: Able to explain home exercise prescription to exercise independently              Exercise Goals Re-Evaluation :    Discharge Exercise Prescription (Final Exercise Prescription Changes):   Nutrition:  Target Goals: Understanding of nutrition guidelines, daily intake of sodium 1500mg , cholesterol 200mg , calories 30% from fat and 7% or less from saturated fats, daily to have 5 or more servings of fruits and vegetables.  Biometrics:  Pre Biometrics - 11/28/19 1208      Pre Biometrics   Height 6' 0.25" (1.835 m)    Weight 91.5 kg    Waist Circumference 40 inches    Hip Circumference 39.5 inches    Waist to Hip Ratio 1.01 %    BMI (Calculated) 27.17    Triceps Skinfold 12 mm    % Body Fat 25.6 %    Grip Strength 60 kg    Flexibility 15 in    Single Leg Stand 25.62 seconds            Nutrition Therapy Plan and Nutrition Goals:   Nutrition Assessments:   Nutrition Goals Re-Evaluation:   Nutrition Goals Discharge (Final Nutrition Goals Re-Evaluation):   Psychosocial: Target Goals:  Acknowledge presence or absence of significant depression and/or stress, maximize coping skills, provide positive support system. Participant is able to verbalize types and ability to use techniques and skills needed for reducing stress  and depression.  Initial Review & Psychosocial Screening:  Initial Psych Review & Screening - 11/28/19 1348      Initial Review   Current issues with Current Stress Concerns    Source of Stress Concerns Chronic Illness      Family Dynamics   Good Support System? Yes   Franco has his wife and two children for support     Barriers   Psychosocial barriers to participate in program There are no identifiable barriers or psychosocial needs.      Screening Interventions   Interventions Encouraged to exercise    Expected Outcomes Long Term Goal: Stressors or current issues are controlled or eliminated.           Quality of Life Scores:  Quality of Life - 11/28/19 1214      Quality of Life   Select Quality of Life      Quality of Life Scores   Health/Function Pre 22.13 %    Socioeconomic Pre 21.31 %    Psych/Spiritual Pre 23.14 %    Family Pre 24.9 %    GLOBAL Pre 22.54 %          Scores of 19 and below usually indicate a poorer quality of life in these areas.  A difference of  2-3 points is a clinically meaningful difference.  A difference of 2-3 points in the total score of the Quality of Life Index has been associated with significant improvement in overall quality of life, self-image, physical symptoms, and general health in studies assessing change in quality of life.  PHQ-9: Recent Review Flowsheet Data    Depression screen [facility] 2/9 11/28/2019 08/29/2017 02/28/2017 02/28/2017 07/18/2016   Decreased Interest 0 0 0 0 0   Down, Depressed, Hopeless 0 0 0 0 0   PHQ - 2 Score 0 0 0 0 0   Altered sleeping - 0 0 - -   Tired, decreased energy - 0 0 - -   Change in appetite - 0 0 - -   Feeling bad or failure about yourself  - 0 0 - -   Trouble concentrating - 0 0 - -   Moving slowly or fidgety/restless - 0 0 - -   Suicidal thoughts - 0 0 - -   PHQ-9 Score - 0 0 - -   Difficult doing work/chores - Not difficult at all Not difficult at all - -     Interpretation of Total Score    Total Score Depression Severity:  1-4 = Minimal depression, 5-9 = Mild depression, 10-14 = Moderate depression, 15-19 = Moderately severe depression, 20-27 = Severe depression   Psychosocial Evaluation and Intervention:   Psychosocial Re-Evaluation:   Psychosocial Discharge (Final Psychosocial Re-Evaluation):   Vocational Rehabilitation: Provide vocational rehab assistance to qualifying candidates.   Vocational Rehab Evaluation & Intervention:  Vocational Rehab - 11/28/19 1351      Initial Vocational Rehab Evaluation & Intervention   Assessment shows need for Vocational Rehabilitation No   Keyshawn is returning to work and does not need vocational rehab at this time          Education: Education Goals: Education classes will be provided on a weekly basis, covering required topics. Participant will state understanding/return demonstration of topics presented.  Learning Barriers/Preferences:  Learning  Barriers/Preferences - 11/28/19 1216      Learning Barriers/Preferences   Learning Barriers None    Learning Preferences Written Material;Individual Instruction;Group Instruction           Education Topics: Hypertension, Hypertension Reduction -Define heart disease and high blood pressure. Discus how high blood pressure affects the body and ways to reduce high blood pressure.   Exercise and Your Heart -Discuss why it is important to exercise, the FITT principles of exercise, normal and abnormal responses to exercise, and how to exercise safely.   Angina -Discuss definition of angina, causes of angina, treatment of angina, and how to decrease risk of having angina.   Cardiac Medications -Review what the following cardiac medications are used for, how they affect the body, and side effects that may occur when taking the medications.  Medications include Aspirin, Beta blockers, calcium channel blockers, ACE Inhibitors, angiotensin receptor blockers, diuretics, digoxin, and  antihyperlipidemics.   Congestive Heart Failure -Discuss the definition of CHF, how to live with CHF, the signs and symptoms of CHF, and how keep track of weight and sodium intake.   Heart Disease and Intimacy -Discus the effect sexual activity has on the heart, how changes occur during intimacy as we age, and safety during sexual activity.   Smoking Cessation / COPD -Discuss different methods to quit smoking, the health benefits of quitting smoking, and the definition of COPD.   Nutrition I: Fats -Discuss the types of cholesterol, what cholesterol does to the heart, and how cholesterol levels can be controlled.   Nutrition II: Labels -Discuss the different components of food labels and how to read food label   Heart Parts/Heart Disease and PAD -Discuss the anatomy of the heart, the pathway of blood circulation through the heart, and these are affected by heart disease.   Stress I: Signs and Symptoms -Discuss the causes of stress, how stress may lead to anxiety and depression, and ways to limit stress.   Stress II: Relaxation -Discuss different types of relaxation techniques to limit stress.   Warning Signs of Stroke / TIA -Discuss definition of a stroke, what the signs and symptoms are of a stroke, and how to identify when someone is having stroke.   Knowledge Questionnaire Score:  Knowledge Questionnaire Score - 11/28/19 1214      Knowledge Questionnaire Score   Pre Score 21/24           Core Components/Risk Factors/Patient Goals at Admission:  Personal Goals and Risk Factors at Admission - 11/28/19 1351      Core Components/Risk Factors/Patient Goals on Admission    Weight Management Yes;Weight Maintenance    Intervention Weight Management: Develop a combined nutrition and exercise program designed to reach desired caloric intake, while maintaining appropriate intake of nutrient and fiber, sodium and fats, and appropriate energy expenditure required for the  weight goal.;Weight Management: Provide education and appropriate resources to help participant work on and attain dietary goals.    Expected Outcomes Weight Maintenance: Understanding of the daily nutrition guidelines, which includes 25-35% calories from fat, 7% or less cal from saturated fats, less than 200mg  cholesterol, less than 1.5gm of sodium, & 5 or more servings of fruits and vegetables daily;Weight Loss: Understanding of general recommendations for a balanced deficit meal plan, which promotes 1-2 lb weight loss per week and includes a negative energy balance of [phone] kcal/d;Understanding recommendations for meals to include 15-35% energy as protein, 25-35% energy from fat, 35-60% energy from carbohydrates, less than 200mg  of dietary cholesterol,  20-35 gm of total fiber daily;Short Term: Continue to assess and modify interventions until short term weight is achieved    Heart Failure Yes    Intervention Provide a combined exercise and nutrition program that is supplemented with education, support and counseling about heart failure. Directed toward relieving symptoms such as shortness of breath, decreased exercise tolerance, and extremity edema.    Expected Outcomes Improve functional capacity of life;Short term: Attendance in program 2-3 days a week with increased exercise capacity. Reported lower sodium intake. Reported increased fruit and vegetable intake. Reports medication compliance.;Short term: Daily weights obtained and reported for increase. Utilizing diuretic protocols set by physician.;Long term: Adoption of self-care skills and reduction of barriers for early signs and symptoms recognition and intervention leading to self-care maintenance.    Hypertension Yes    Intervention Provide education on lifestyle modifcations including regular physical activity/exercise, weight management, moderate sodium restriction and increased consumption of fresh fruit, vegetables, and low fat dairy, alcohol  moderation, and smoking cessation.;Monitor prescription use compliance.    Expected Outcomes Short Term: Continued assessment and intervention until BP is < 140/14mm HG in hypertensive participants. < 130/81mm HG in hypertensive participants with diabetes, heart failure or chronic kidney disease.;Long Term: Maintenance of blood pressure at goal levels.    Lipids Yes    Intervention Provide education and support for participant on nutrition & aerobic/resistive exercise along with prescribed medications to achieve LDL 70mg , HDL >40mg .    Expected Outcomes Short Term: Participant states understanding of desired cholesterol values and is compliant with medications prescribed. Participant is following exercise prescription and nutrition guidelines.;Long Term: Cholesterol controlled with medications as prescribed, with individualized exercise RX and with personalized nutrition plan. Value goals: LDL < 70mg , HDL > 40 mg.    Stress Yes    Intervention Offer individual and/or small group education and counseling on adjustment to heart disease, stress management and health-related lifestyle change. Teach and support self-help strategies.;Refer participants experiencing significant psychosocial distress to appropriate mental health specialists for further evaluation and treatment. When possible, include family members and significant others in education/counseling sessions.    Expected Outcomes Short Term: Participant demonstrates changes in health-related behavior, relaxation and other stress management skills, ability to obtain effective social support, and compliance with psychotropic medications if prescribed.;Long Term: Emotional wellbeing is indicated by absence of clinically significant psychosocial distress or social isolation.           Core Components/Risk Factors/Patient Goals Review:    Core Components/Risk Factors/Patient Goals at Discharge (Final Review):    ITP Comments:  ITP Comments     Row Name 11/28/19 1154           ITP Comments Dr. Fransico Him, Medical Director              Comments: Lennette Bihari attended orientation on 11/28/2019 to review rules and guidelines for program.  Completed 6 minute walk test, Intitial ITP, and exercise prescription.  VSS. Telemetry-Sinus Rhythm inverted t wave this has been previously documented.  Asymptomatic. Safety measures and social distancing in place per CDC guidelines.Patient was wearing his life vest.[facility] Venetia Maxon, RN,BSN 11/28/2019 2:02 PM

## 2019-12-02 ENCOUNTER — Other Ambulatory Visit: Payer: Self-pay

## 2019-12-02 ENCOUNTER — Encounter (HOSPITAL_COMMUNITY)
Admission: RE | Admit: 2019-12-02 | Discharge: 2019-12-02 | Disposition: A | Discharge status: Home / Self Care | Payer: BC Managed Care – PPO | Source: Ambulatory Visit | Attending: Cardiology | Admitting: Cardiology

## 2019-12-02 DIAGNOSIS — I214 Non-ST elevation (NSTEMI) myocardial infarction: Secondary | ICD-10-CM

## 2019-12-02 DIAGNOSIS — Z955 Presence of coronary angioplasty implant and graft: Secondary | ICD-10-CM

## 2019-12-02 NOTE — Progress Notes (Signed)
Daily Session Note  Patient Details  Name: Edward Pierce MRN: [id number] Date of Birth: 30-Oct-1965 Referring Provider:     CARDIAC REHAB PHASE II ORIENTATION from 11/28/2019 in [place]  Referring Provider Glenetta Hew MD      Encounter Date: 12/02/2019  Check In:  Session Check In - 12/02/19 0910      Check-In   Supervising physician immediately available to respond to emergencies [facility] immediately available    Physician(s) Dr. Lonny Prude    Location [facility]    Staff Present Barnet Pall, RN, Mosie Epstein, MS,ACSM CEP, Exercise Physiologist;Olinty Celesta Aver, MS, ACSM CEP, Exercise Physiologist;Jessica Hassell Done, MS, ACSM-CEP, Exercise Physiologist;Portia Rollene Rotunda, RN, BSN    Virtual Visit No    Medication changes reported     No    Fall or balance concerns reported    No    Tobacco Cessation No Change    Warm-up and Cool-down Performed on first and last piece of equipment    Resistance Training Performed Yes    VAD Patient? No    PAD/SET Patient? No      Pain Assessment   Currently in Pain? No/denies    Multiple Pain Sites No           Capillary Blood Glucose: No results found for this or any previous visit (from the past 24 hour(s)).   Exercise Prescription Changes - 12/02/19 1000      Response to Exercise   Blood Pressure (Admit) 110/70    Blood Pressure (Exercise) 114/72    Blood Pressure (Exit) 120/74    Heart Rate (Admit) 70 bpm    Heart Rate (Exercise) 95 bpm    Heart Rate (Exit) 69 bpm    Rating of Perceived Exertion (Exercise) 13    Perceived Dyspnea (Exercise) 0    Symptoms None    Comments Pt's first day of exercise    Duration Progress to 10 minutes continuous walking  at current work load and total walking time to 30-45 min    Intensity THRR unchanged      Progression   Progression Continue to progress workloads to maintain intensity without signs/symptoms of physical distress.     Average METs 2.82      Resistance Training   Training Prescription Yes    Weight 4lbs    Reps 10-15    Time 10 Minutes      Treadmill   MPH 2.8    Grade 0    Minutes 15    METs 3.14      Bike   Level 1.5    Watts 30    Minutes 15    METs 2.5           Social History   Tobacco Use  Smoking Status Former Smoker  . Packs/day: 0.25  . Years: 13.00  . Pack years: 3.25  . Types: Cigarettes  . Quit date: 02/12/1996  . Years since quitting: 23.8  Smokeless Tobacco Never Used    Goals Met:  Exercise tolerated well No report of cardiac concerns or symptoms Strength training completed today  Goals Unmet:  Not Applicable  Comments: Edward Pierce started cardiac rehab today.  Pt tolerated light exercise without difficulty. VSS, telemetry-Sinus rhythm inverted t wave, asymptomatic.  Medication list reconciled. Pt denies barriers to medicaiton compliance.  PSYCHOSOCIAL ASSESSMENT:  PHQ-0. Pt exhibits positive coping skills, hopeful outlook with supportive family. No psychosocial needs identified at this time, no psychosocial interventions necessary.  Pt enjoys spending time outdoors.   Pt oriented to exercise equipment and routine.    Understanding verbalized.Barnet Pall, RN,BSN 12/02/2019 10:20 AM   Dr. Fransico Him is Medical Director for Cardiac Rehab at [facility].

## 2019-12-04 ENCOUNTER — Other Ambulatory Visit: Payer: Self-pay

## 2019-12-04 ENCOUNTER — Encounter (HOSPITAL_COMMUNITY)
Admission: RE | Admit: 2019-12-04 | Discharge: 2019-12-04 | Disposition: A | Discharge status: Home / Self Care | Payer: BC Managed Care – PPO | Source: Ambulatory Visit | Attending: Cardiology | Admitting: Cardiology

## 2019-12-04 DIAGNOSIS — I214 Non-ST elevation (NSTEMI) myocardial infarction: Secondary | ICD-10-CM | POA: Diagnosis not present

## 2019-12-04 DIAGNOSIS — Z955 Presence of coronary angioplasty implant and graft: Secondary | ICD-10-CM | POA: Diagnosis not present

## 2019-12-06 ENCOUNTER — Other Ambulatory Visit: Payer: Self-pay

## 2019-12-06 ENCOUNTER — Encounter (HOSPITAL_COMMUNITY)
Admission: RE | Admit: 2019-12-06 | Discharge: 2019-12-06 | Disposition: A | Discharge status: Home / Self Care | Payer: BC Managed Care – PPO | Source: Ambulatory Visit | Attending: Cardiology | Admitting: Cardiology

## 2019-12-06 VITALS — Wt 201.0 lb

## 2019-12-06 DIAGNOSIS — I214 Non-ST elevation (NSTEMI) myocardial infarction: Secondary | ICD-10-CM

## 2019-12-06 DIAGNOSIS — Z9861 Coronary angioplasty status: Secondary | ICD-10-CM | POA: Diagnosis not present

## 2019-12-06 DIAGNOSIS — I447 Left bundle-branch block, unspecified: Secondary | ICD-10-CM | POA: Diagnosis not present

## 2019-12-06 DIAGNOSIS — I513 Intracardiac thrombosis, not elsewhere classified: Secondary | ICD-10-CM | POA: Diagnosis not present

## 2019-12-06 DIAGNOSIS — Z955 Presence of coronary angioplasty implant and graft: Secondary | ICD-10-CM | POA: Diagnosis not present

## 2019-12-06 NOTE — Progress Notes (Signed)
Edward Pierce 54 y.o. male Nutrition Note  Visit Diagnosis: NSTEMI (non-ST elevated myocardial infarction) ([place]) 10/01/19  S/P DES LAD 10/01/19  Past Medical History:  Diagnosis Date  . Coronary artery disease involving native coronary artery of native heart with angina pectoris ([place]) 10/03/2019   Cardiac Cath-PCI 10/02/2019:Culprit lesion mid LAD 99% heavily thrombotic lesion-->aspiration thrombectomy, DES PCI Synergy 3.0 x 23. 3.3 mm). Otherwise essentially normal coronaries with RI, LCx and RCA free of significant disease-RCA has mild proximal 20%.. Relatively normal LVEDP 7 mmHg.  Marland Kitchen History of acute anterior wall MI (did not meet full STEMI criteria) 10/02/2019   associated with AIVR & Sustained VT - ? reperfusion / ischemic arrhythmias  . Hyperlipidemia with target LDL less than 70 01/19/2016  . Hypertriglyceridemia, familial 01/19/2016  . Ischemic cardiomyopathy 10/03/2019   TTE 10/02/2019: (Post anterior MI) EF roughly 35%. Mid-apical anteroseptal and inferoseptal akinesis. Apical inferior apical anterior and apical akinesis. GR 1 DD.   Marland Kitchen Sustained ventricular tachycardia ([place]) 10/03/2019   Admitted for ACS-troponin elevation, EKG with borderline anterior STEMI findings.  Had AIVR and sustained VT during stabilization overnight.     Medications reviewed.   Current Outpatient Medications:  .  aspirin EC 81 MG EC tablet, Take 1 tablet (81 mg total) by mouth daily. Swallow whole., Disp: 30 tablet, Rfl: 11 .  atorvastatin (LIPITOR) 80 MG tablet, Take 1 tablet (80 mg total) by mouth at bedtime., Disp: 30 tablet, Rfl: 2 .  carvedilol (COREG) 3.125 MG tablet, TAKE 1 TABLET BY MOUTH TWICE A DAY WITH A MEAL, Disp: 60 tablet, Rfl: 11 .  losartan (COZAAR) 25 MG tablet, TAKE 1 TABLET BY MOUTH EVERY DAY, Disp: 30 tablet, Rfl: 11 .  nitroGLYCERIN (NITROSTAT) 0.4 MG SL tablet, Place 1 tablet (0.4 mg total) under the tongue every 5 (five) minutes as needed for chest pain (CP or SOB)., Disp: 25  tablet, Rfl: 2 .  ticagrelor (BRILINTA) 90 MG TABS tablet, Take 1 tablet (90 mg total) by mouth 2 (two) times daily., Disp: 60 tablet, Rfl: 11   Ht Readings from Last 1 Encounters:  11/28/19 6' 0.25" (1.835 m)     Wt Readings from Last 3 Encounters:  11/28/19 201 lb 11.5 oz (91.5 kg)  10/21/19 202 lb 3.2 oz (91.7 kg)  10/05/19 202 lb 3.2 oz (91.7 kg)     There is no height or weight on file to calculate BMI.   Social History   Tobacco Use  Smoking Status Former Smoker  . Packs/day: 0.25  . Years: 13.00  . Pack years: 3.25  . Types: Cigarettes  . Quit date: 02/12/1996  . Years since quitting: 23.8  Smokeless Tobacco Never Used     Lab Results  Component Value Date   CHOL 323 (H) 10/02/2019   Lab Results  Component Value Date   HDL 47 10/02/2019   Lab Results  Component Value Date   LDLCALC 241 (H) 10/02/2019   Lab Results  Component Value Date   TRIG 177 (H) 10/02/2019     Lab Results  Component Value Date   HGBA1C 5.6 10/01/2019     CBG (last 3)  No results for input(s): GLUCAP in the last 72 hours.   Nutrition Note  Spoke with pt. Nutrition Plan and Nutrition Survey goals reviewed with pt. Pt is following a Heart Healthy diet.   Pt with dx of CHF. Per discussion, pt does not use canned/convenience foods often. Pt does not add salt to food. Pt  does not eat out frequently.  Needs support with label reading to make it easier to navigate.  Limiting sodium to <2000 mg/day. He watches the saturated fats and chooses less red meats, processed meats, and high fat dairy. He states previous to MI, he was eating a generally healthy diet but making less healthy choices with late night snacks and drinks.  He is working to improve. Pt expressed understanding of the information reviewed.  Nutrition Diagnosis ? Inappropriate intake of saturated fats related to food related knowledge deficit as evidenced by pt's pre MI diet recall of late night snacking and white  r[place] and LDL value of 241 mg/dl  Nutrition Intervention ? Pt's individual nutrition plan reviewed with pt. ? Benefits of adopting Heart Healthy diet discussed when Medficts reviewed.   ? Continue client-centered nutrition education by RD, as part of interdisciplinary care.  Goal(s) ? Pt to build a healthy plate including vegetables, fruits, whole grains, and low-fat dairy products in a heart healthy meal plan. ? Pt to eat a variety of non-starchy vegetables. ? Pt to cut down on meats high in saturated fat. ? Pt to eat more beans and peas ? Pt to choose fish more often   Plan:   Will provide client-centered nutrition education as part of interdisciplinary care  Monitor and evaluate progress toward nutrition goal with team.   Michaele Offer, MS, RDN, LDN

## 2019-12-09 ENCOUNTER — Other Ambulatory Visit: Payer: Self-pay

## 2019-12-09 ENCOUNTER — Encounter (HOSPITAL_COMMUNITY)
Admission: RE | Admit: 2019-12-09 | Discharge: 2019-12-09 | Disposition: A | Discharge status: Home / Self Care | Payer: BC Managed Care – PPO | Source: Ambulatory Visit | Attending: Cardiology | Admitting: Cardiology

## 2019-12-09 DIAGNOSIS — Z955 Presence of coronary angioplasty implant and graft: Secondary | ICD-10-CM

## 2019-12-09 DIAGNOSIS — I214 Non-ST elevation (NSTEMI) myocardial infarction: Secondary | ICD-10-CM | POA: Diagnosis not present

## 2019-12-10 NOTE — Progress Notes (Signed)
Cardiac Individual Treatment Plan  Patient Details  Name: Edward Pierce MRN: [id number] Date of Birth: 09/05/1965 Referring Provider:     CARDIAC REHAB PHASE II ORIENTATION from 11/28/2019 in [place]  Referring Provider Glenetta Hew MD      Initial Encounter Date:    CARDIAC REHAB PHASE II ORIENTATION from 11/28/2019 in [place]  Date 11/28/19      Visit Diagnosis: NSTEMI (non-ST elevated myocardial infarction) ([place]) 10/01/19  S/P DES LAD 10/01/19  Patient's Home Medications on Admission:  Current Outpatient Medications:  .  aspirin EC 81 MG EC tablet, Take 1 tablet (81 mg total) by mouth daily. Swallow whole., Disp: 30 tablet, Rfl: 11 .  atorvastatin (LIPITOR) 80 MG tablet, Take 1 tablet (80 mg total) by mouth at bedtime., Disp: 30 tablet, Rfl: 2 .  carvedilol (COREG) 3.125 MG tablet, TAKE 1 TABLET BY MOUTH TWICE A DAY WITH A MEAL, Disp: 60 tablet, Rfl: 11 .  losartan (COZAAR) 25 MG tablet, TAKE 1 TABLET BY MOUTH EVERY DAY, Disp: 30 tablet, Rfl: 11 .  nitroGLYCERIN (NITROSTAT) 0.4 MG SL tablet, Place 1 tablet (0.4 mg total) under the tongue every 5 (five) minutes as needed for chest pain (CP or SOB)., Disp: 25 tablet, Rfl: 2 .  ticagrelor (BRILINTA) 90 MG TABS tablet, Take 1 tablet (90 mg total) by mouth 2 (two) times daily., Disp: 60 tablet, Rfl: 11  Past Medical History: Past Medical History:  Diagnosis Date  . Coronary artery disease involving native coronary artery of native heart with angina pectoris ([place]) 10/03/2019   Cardiac Cath-PCI 10/02/2019:Culprit lesion mid LAD 99% heavily thrombotic lesion-->aspiration thrombectomy, DES PCI Synergy 3.0 x 23. 3.3 mm). Otherwise essentially normal coronaries with RI, LCx and RCA free of significant disease-RCA has mild proximal 20%.. Relatively normal LVEDP 7 mmHg.  Marland Kitchen History of acute anterior wall MI (did not meet full STEMI criteria) 10/02/2019   associated with  AIVR & Sustained VT - ? reperfusion / ischemic arrhythmias  . Hyperlipidemia with target LDL less than 70 01/19/2016  . Hypertriglyceridemia, familial 01/19/2016  . Ischemic cardiomyopathy 10/03/2019   TTE 10/02/2019: (Post anterior MI) EF roughly 35%. Mid-apical anteroseptal and inferoseptal akinesis. Apical inferior apical anterior and apical akinesis. GR 1 DD.   Marland Kitchen Sustained ventricular tachycardia ([place]) 10/03/2019   Admitted for ACS-troponin elevation, EKG with borderline anterior STEMI findings.  Had AIVR and sustained VT during stabilization overnight.    Tobacco Use: Social History   Tobacco Use  Smoking Status Former Smoker  . Packs/day: 0.25  . Years: 13.00  . Pack years: 3.25  . Types: Cigarettes  . Quit date: 02/12/1996  . Years since quitting: 23.8  Smokeless Tobacco Never Used    Labs: Recent Chemical engineer    Labs for ITP Cardiac and Pulmonary Rehab Latest Ref Rng & Units 01/13/2016 07/18/2016 08/29/2017 10/01/2019 10/02/2019   Cholestrol 0 - 200 mg/dL 290(H) 177 176 - 323(H)   LDLCALC 0 - 99 mg/dL 183(H) 106(H) 87 - 241(H)   HDL >40 mg/dL 32(L) 39(L) 39(L) - 47   Trlycerides <150 mg/dL 374(H) 160(H) 249(H) - 177(H)   Hemoglobin A1c 4.8 - 5.6 % - - 5.6 5.6 -      Capillary Blood Glucose: No results found for: GLUCAP   Exercise Target Goals: Exercise Program Goal: Individual exercise prescription set using results from initial 6 min walk test and THRR while considering  patient's activity barriers and safety.  Exercise Prescription Goal: Starting with aerobic activity 30 plus minutes a day, 3 days per week for initial exercise prescription. Provide home exercise prescription and guidelines that participant acknowledges understanding prior to discharge.  Activity Barriers & Risk Stratification:  Activity Barriers & Cardiac Risk Stratification - 11/28/19 1207      Activity Barriers & Cardiac Risk Stratification   Activity Barriers Back Problems;Arthritis      Cardiac Risk Stratification High           6 Minute Walk:  6 Minute Walk    Row Name 11/28/19 1206         6 Minute Walk   Phase Initial     Distance 1800 feet     Walk Time 6 minutes     # of Rest Breaks 0     MPH 3.4     METS 4.7     RPE 12     Perceived Dyspnea  0     VO2 Peak 16.6     Symptoms No     Resting HR 67 bpm     Resting BP 102/60     Resting Oxygen Saturation  98 %     Exercise Oxygen Saturation  during 6 min walk 99 %     Max Ex. HR 99 bpm     Max Ex. BP 130/80     2 Minute Post BP 122/80            Oxygen Initial Assessment:   Oxygen Re-Evaluation:   Oxygen Discharge (Final Oxygen Re-Evaluation):   Initial Exercise Prescription:  Initial Exercise Prescription - 11/28/19 1200      Date of Initial Exercise RX and Referring Provider   Date 11/28/19    Referring Provider Glenetta Hew MD    Expected Discharge Date 01/24/20      Treadmill   MPH 2.8    Grade 0    Minutes 15    METs 3.14      Bike   Level 1.5    Watts 35    Minutes 15    METs 3      Prescription Details   Frequency (times per week) 3x    Duration Progress to 10 minutes continuous walking  at current work load and total walking time to 30-45 min      Intensity   THRR 40-80% of Max Heartrate 66-133    Ratings of Perceived Exertion 11-13    Perceived Dyspnea 0-4      Progression   Progression Continue progressive overload as per policy without signs/symptoms or physical distress.      Resistance Training   Training Prescription Yes    Weight 4lbs    Reps 10-15           Perform Capillary Blood Glucose checks as needed.  Exercise Prescription Changes:   Exercise Prescription Changes    Row Name 12/02/19 1000 12/11/19 0658           Response to Exercise   Blood Pressure (Admit) 110/70 118/80      Blood Pressure (Exercise) 114/72 134/80      Blood Pressure (Exit) 120/74 106/64      Heart Rate (Admit) 70 bpm 76 bpm      Heart Rate (Exercise) 95  bpm 106 bpm      Heart Rate (Exit) 69 bpm 75 bpm      Rating of Perceived Exertion (Exercise) 13 12      Perceived Dyspnea (Exercise) 0  0      Symptoms None None      Comments Pt's first day of exercise None      Duration Progress to 10 minutes continuous walking  at current work load and total walking time to 30-45 min Progress to 30 minutes of  aerobic without signs/symptoms of physical distress      Intensity THRR unchanged THRR unchanged        Progression   Progression Continue to progress workloads to maintain intensity without signs/symptoms of physical distress. Continue to progress workloads to maintain intensity without signs/symptoms of physical distress.      Average METs 2.82 3.75        Resistance Training   Training Prescription Yes No      Weight 4lbs --      Reps 10-15 --      Time 10 Minutes --        Treadmill   MPH 2.8 2.8      Grade 0 2      Minutes 15 15      METs 3.14 3.91        Bike   Level 1.5 2.5      Watts 30 40      Minutes 15 15      METs 2.5 2.6             Exercise Comments:   Exercise Comments    Row Name 12/02/19 1004 12/12/19 0659         Exercise Comments Pt's first day of exercise. Pt oriented to exercise equipment. Pt responded well to workloads. Will continue to monitor and progress pt as tolerated. Pt tolerating exercise prescription and workload increases well. Will follow up with pt regarding home exercise plan. Will continue to monitor.             Exercise Goals and Review:   Exercise Goals    Row Name 11/28/19 1207             Exercise Goals   Increase Physical Activity Yes       Intervention Provide advice, education, support and counseling about physical activity/exercise needs.;Develop an individualized exercise prescription for aerobic and resistive training based on initial evaluation findings, risk stratification, comorbidities and participant's personal goals.       Expected Outcomes Short Term: Attend  rehab on a regular basis to increase amount of physical activity.;Long Term: Add in home exercise to make exercise part of routine and to increase amount of physical activity.;Long Term: Exercising regularly at least 3-5 days a week.       Increase Strength and Stamina Yes       Intervention Provide advice, education, support and counseling about physical activity/exercise needs.;Develop an individualized exercise prescription for aerobic and resistive training based on initial evaluation findings, risk stratification, comorbidities and participant's personal goals.       Expected Outcomes Short Term: Increase workloads from initial exercise prescription for resistance, speed, and METs.;Short Term: Perform resistance training exercises routinely during rehab and add in resistance training at home;Long Term: Improve cardiorespiratory fitness, muscular endurance and strength as measured by increased METs and functional capacity (6MWT)       Able to understand and use rate of perceived exertion (RPE) scale Yes       Intervention Provide education and explanation on how to use RPE scale       Expected Outcomes Short Term: Able to use RPE daily in rehab to express subjective intensity  level;Long Term:  Able to use RPE to guide intensity level when exercising independently       Knowledge and understanding of Target Heart Rate Range (THRR) Yes       Intervention Provide education and explanation of THRR including how the numbers were predicted and where they are located for reference       Expected Outcomes Short Term: Able to state/look up THRR;Long Term: Able to use THRR to govern intensity when exercising independently;Short Term: Able to use daily as guideline for intensity in rehab       Able to check pulse independently Yes       Intervention Provide education and demonstration on how to check pulse in carotid and radial arteries.;Review the importance of being able to check your own pulse for safety  during independent exercise       Expected Outcomes Short Term: Able to explain why pulse checking is important during independent exercise;Long Term: Able to check pulse independently and accurately       Understanding of Exercise Prescription Yes       Intervention Provide education, explanation, and written materials on patient's individual exercise prescription       Expected Outcomes Short Term: Able to explain program exercise prescription;Long Term: Able to explain home exercise prescription to exercise independently              Exercise Goals Re-Evaluation :  Exercise Goals Re-Evaluation    Row Name 12/02/19 1005             Exercise Goal Re-Evaluation   Exercise Goals Review Increase Physical Activity;Knowledge and understanding of Target Heart Rate Range (THRR);Able to understand and use rate of perceived exertion (RPE) scale       Comments Pt's first day of exercise. Pt oriented to equipment. Pt responded well with exercise, was able to exercise for 30 minutes with no difficulty.       Expected Outcomes Pt will continue to increase strength and stamina. Will follow up with home exercise plan.               Discharge Exercise Prescription (Final Exercise Prescription Changes):  Exercise Prescription Changes - 12/11/19 0658      Response to Exercise   Blood Pressure (Admit) 118/80    Blood Pressure (Exercise) 134/80    Blood Pressure (Exit) 106/64    Heart Rate (Admit) 76 bpm    Heart Rate (Exercise) 106 bpm    Heart Rate (Exit) 75 bpm    Rating of Perceived Exertion (Exercise) 12    Perceived Dyspnea (Exercise) 0    Symptoms None    Comments None    Duration Progress to 30 minutes of  aerobic without signs/symptoms of physical distress    Intensity THRR unchanged      Progression   Progression Continue to progress workloads to maintain intensity without signs/symptoms of physical distress.    Average METs 3.75      Resistance Training   Training  Prescription No      Treadmill   MPH 2.8    Grade 2    Minutes 15    METs 3.91      Bike   Level 2.5    Watts 40    Minutes 15    METs 2.6           Nutrition:  Target Goals: Understanding of nutrition guidelines, daily intake of sodium 1500mg , cholesterol 200mg , calories 30% from fat and 7% or less  from saturated fats, daily to have 5 or more servings of fruits and vegetables.  Biometrics:  Pre Biometrics - 11/28/19 1208      Pre Biometrics   Height 6' 0.25" (1.835 m)    Weight 91.5 kg    Waist Circumference 40 inches    Hip Circumference 39.5 inches    Waist to Hip Ratio 1.01 %    BMI (Calculated) 27.17    Triceps Skinfold 12 mm    % Body Fat 25.6 %    Grip Strength 60 kg    Flexibility 15 in    Single Leg Stand 25.62 seconds            Nutrition Therapy Plan and Nutrition Goals:  Nutrition Therapy & Goals - 12/06/19 1328      Nutrition Therapy   Diet Heart healthy/low sodium      Personal Nutrition Goals   Nutrition Goal Pt to build a healthy plate including vegetables, fruits, whole grains, and low-fat dairy products in a heart healthy meal plan.    Personal Goal #2 Pt to eat a variety of non-starchy vegetables.    Personal Goal #3 Pt to cut down on meats high in saturated fat.    Personal Goal #4 Pt to choose fish more often      Intervention Plan   Intervention Prescribe, educate and counsel regarding individualized specific dietary modifications aiming towards targeted core components such as weight, hypertension, lipid management, diabetes, heart failure and other comorbidities.    Expected Outcomes Short Term Goal: Understand basic principles of dietary content, such as calories, fat, sodium, cholesterol and nutrients.           Nutrition Assessments:  Nutrition Assessments - 12/03/19 1008      MEDFICTS Scores   Pre Score 0           Nutrition Goals Re-Evaluation:  Nutrition Goals Re-Evaluation    [place] Name 12/06/19 1328              Goals   Current Weight 201 lb (91.2 kg)              Nutrition Goals Discharge (Final Nutrition Goals Re-Evaluation):  Nutrition Goals Re-Evaluation - 12/06/19 1328      Goals   Current Weight 201 lb (91.2 kg)           Psychosocial: Target Goals: Acknowledge presence or absence of significant depression and/or stress, maximize coping skills, provide positive support system. Participant is able to verbalize types and ability to use techniques and skills needed for reducing stress and depression.  Initial Review & Psychosocial Screening:  Initial Psych Review & Screening - 11/28/19 1348      Initial Review   Current issues with Current Stress Concerns    Source of Stress Concerns Chronic Illness      Family Dynamics   Good Support System? Yes   Chevez has his wife and two children for support     Barriers   Psychosocial barriers to participate in program There are no identifiable barriers or psychosocial needs.      Screening Interventions   Interventions Encouraged to exercise    Expected Outcomes Long Term Goal: Stressors or current issues are controlled or eliminated.           Quality of Life Scores:  Quality of Life - 11/28/19 1214      Quality of Life   Select Quality of Life      Quality of Life Scores  Health/Function Pre 22.13 %    Socioeconomic Pre 21.31 %    Psych/Spiritual Pre 23.14 %    Family Pre 24.9 %    GLOBAL Pre 22.54 %          Scores of 19 and below usually indicate a poorer quality of life in these areas.  A difference of  2-3 points is a clinically meaningful difference.  A difference of 2-3 points in the total score of the Quality of Life Index has been associated with significant improvement in overall quality of life, self-image, physical symptoms, and general health in studies assessing change in quality of life.  PHQ-9: Recent Review Flowsheet Data    Depression screen [facility] 2/9 11/28/2019 08/29/2017 02/28/2017 02/28/2017  07/18/2016   Decreased Interest 0 0 0 0 0   Down, Depressed, Hopeless 0 0 0 0 0   PHQ - 2 Score 0 0 0 0 0   Altered sleeping - 0 0 - -   Tired, decreased energy - 0 0 - -   Change in appetite - 0 0 - -   Feeling bad or failure about yourself  - 0 0 - -   Trouble concentrating - 0 0 - -   Moving slowly or fidgety/restless - 0 0 - -   Suicidal thoughts - 0 0 - -   PHQ-9 Score - 0 0 - -   Difficult doing work/chores - Not difficult at all Not difficult at all - -     Interpretation of Total Score  Total Score Depression Severity:  1-4 = Minimal depression, 5-9 = Mild depression, 10-14 = Moderate depression, 15-19 = Moderately severe depression, 20-27 = Severe depression   Psychosocial Evaluation and Intervention:   Psychosocial Re-Evaluation:  Psychosocial Re-Evaluation    Row Name 12/10/19 1406 12/10/19 1410           Psychosocial Re-Evaluation   Current issues with Current Sleep Concerns (P)  Current Stress Concerns      Comments Aadit continues to have stress concerns due to his recent hospitalization. (P)  Yoon has not voiced any increased stressors or concerns      Interventions -- Stress management education;Encouraged to attend Cardiac Rehabilitation for the exercise      Continue Psychosocial Services  -- No Follow up required      Comments -- Will continue to monitor and offer support as needed        Initial Review   Source of Stress Concerns -- Chronic Illness             Psychosocial Discharge (Final Psychosocial Re-Evaluation):  Psychosocial Re-Evaluation - 12/10/19 1410      Psychosocial Re-Evaluation   Current issues with Current Stress Concerns    Comments Devery has not voiced any increased stressors or concerns    Interventions Stress management education;Encouraged to attend Cardiac Rehabilitation for the exercise    Continue Psychosocial Services  No Follow up required    Comments Will continue to monitor and offer support as needed      Initial  Review   Source of Stress Concerns Chronic Illness           Vocational Rehabilitation: Provide vocational rehab assistance to qualifying candidates.   Vocational Rehab Evaluation & Intervention:  Vocational Rehab - 11/28/19 1351      Initial Vocational Rehab Evaluation & Intervention   Assessment shows need for Vocational Rehabilitation No   Candido is returning to work and does not need vocational rehab  at this time          Education: Education Goals: Education classes will be provided on a weekly basis, covering required topics. Participant will state understanding/return demonstration of topics presented.  Learning Barriers/Preferences:  Learning Barriers/Preferences - 11/28/19 1216      Learning Barriers/Preferences   Learning Barriers None    Learning Preferences Written Material;Individual Instruction;Group Instruction           Education Topics: Hypertension, Hypertension Reduction -Define heart disease and high blood pressure. Discus how high blood pressure affects the body and ways to reduce high blood pressure.   Exercise and Your Heart -Discuss why it is important to exercise, the FITT principles of exercise, normal and abnormal responses to exercise, and how to exercise safely.   Angina -Discuss definition of angina, causes of angina, treatment of angina, and how to decrease risk of having angina.   Cardiac Medications -Review what the following cardiac medications are used for, how they affect the body, and side effects that may occur when taking the medications.  Medications include Aspirin, Beta blockers, calcium channel blockers, ACE Inhibitors, angiotensin receptor blockers, diuretics, digoxin, and antihyperlipidemics.   Congestive Heart Failure -Discuss the definition of CHF, how to live with CHF, the signs and symptoms of CHF, and how keep track of weight and sodium intake.   Heart Disease and Intimacy -Discus the effect sexual activity has  on the heart, how changes occur during intimacy as we age, and safety during sexual activity.   Smoking Cessation / COPD -Discuss different methods to quit smoking, the health benefits of quitting smoking, and the definition of COPD.   Nutrition I: Fats -Discuss the types of cholesterol, what cholesterol does to the heart, and how cholesterol levels can be controlled.   Nutrition II: Labels -Discuss the different components of food labels and how to read food label   Heart Parts/Heart Disease and PAD -Discuss the anatomy of the heart, the pathway of blood circulation through the heart, and these are affected by heart disease.   Stress I: Signs and Symptoms -Discuss the causes of stress, how stress may lead to anxiety and depression, and ways to limit stress.   Stress II: Relaxation -Discuss different types of relaxation techniques to limit stress.   Warning Signs of Stroke / TIA -Discuss definition of a stroke, what the signs and symptoms are of a stroke, and how to identify when someone is having stroke.   Knowledge Questionnaire Score:  Knowledge Questionnaire Score - 11/28/19 1214      Knowledge Questionnaire Score   Pre Score 21/24           Core Components/Risk Factors/Patient Goals at Admission:  Personal Goals and Risk Factors at Admission - 11/28/19 1351      Core Components/Risk Factors/Patient Goals on Admission    Weight Management Yes;Weight Maintenance    Intervention Weight Management: Develop a combined nutrition and exercise program designed to reach desired caloric intake, while maintaining appropriate intake of nutrient and fiber, sodium and fats, and appropriate energy expenditure required for the weight goal.;Weight Management: Provide education and appropriate resources to help participant work on and attain dietary goals.    Expected Outcomes Weight Maintenance: Understanding of the daily nutrition guidelines, which includes 25-35% calories from  fat, 7% or less cal from saturated fats, less than 200mg  cholesterol, less than 1.5gm of sodium, & 5 or more servings of fruits and vegetables daily;Weight Loss: Understanding of general recommendations for a balanced deficit meal plan,  which promotes 1-2 lb weight loss per week and includes a negative energy balance of [phone] kcal/d;Understanding recommendations for meals to include 15-35% energy as protein, 25-35% energy from fat, 35-60% energy from carbohydrates, less than 200mg  of dietary cholesterol, 20-35 gm of total fiber daily;Short Term: Continue to assess and modify interventions until short term weight is achieved    Heart Failure Yes    Intervention Provide a combined exercise and nutrition program that is supplemented with education, support and counseling about heart failure. Directed toward relieving symptoms such as shortness of breath, decreased exercise tolerance, and extremity edema.    Expected Outcomes Improve functional capacity of life;Short term: Attendance in program 2-3 days a week with increased exercise capacity. Reported lower sodium intake. Reported increased fruit and vegetable intake. Reports medication compliance.;Short term: Daily weights obtained and reported for increase. Utilizing diuretic protocols set by physician.;Long term: Adoption of self-care skills and reduction of barriers for early signs and symptoms recognition and intervention leading to self-care maintenance.    Hypertension Yes    Intervention Provide education on lifestyle modifcations including regular physical activity/exercise, weight management, moderate sodium restriction and increased consumption of fresh fruit, vegetables, and low fat dairy, alcohol moderation, and smoking cessation.;Monitor prescription use compliance.    Expected Outcomes Short Term: Continued assessment and intervention until BP is < 140/88mm HG in hypertensive participants. < 130/26mm HG in hypertensive participants with  diabetes, heart failure or chronic kidney disease.;Long Term: Maintenance of blood pressure at goal levels.    Lipids Yes    Intervention Provide education and support for participant on nutrition & aerobic/resistive exercise along with prescribed medications to achieve LDL 70mg , HDL >40mg .    Expected Outcomes Short Term: Participant states understanding of desired cholesterol values and is compliant with medications prescribed. Participant is following exercise prescription and nutrition guidelines.;Long Term: Cholesterol controlled with medications as prescribed, with individualized exercise RX and with personalized nutrition plan. Value goals: LDL < 70mg , HDL > 40 mg.    Stress Yes    Intervention Offer individual and/or small group education and counseling on adjustment to heart disease, stress management and health-related lifestyle change. Teach and support self-help strategies.;Refer participants experiencing significant psychosocial distress to appropriate mental health specialists for further evaluation and treatment. When possible, include family members and significant others in education/counseling sessions.    Expected Outcomes Short Term: Participant demonstrates changes in health-related behavior, relaxation and other stress management skills, ability to obtain effective social support, and compliance with psychotropic medications if prescribed.;Long Term: Emotional wellbeing is indicated by absence of clinically significant psychosocial distress or social isolation.           Core Components/Risk Factors/Patient Goals Review:   Goals and Risk Factor Review    Row Name 12/03/19 0805 12/10/19 1412           Core Components/Risk Factors/Patient Goals Review   Personal Goals Review Weight Management/Obesity;Heart Failure;Lipids;Hypertension;Stress Weight Management/Obesity;Heart Failure;Lipids;Hypertension;Stress      Review Aeon started exercise on 12/03/19. Winn did well with  exercise. Tait is doing well with exercise. Dawood's vital sing have been stable.      Expected Outcomes Dionte will continue to participate in phase 2 cardiac rehab for exercise, nutrition and lifestyle modifications Gaither will continue to participate in phase 2 cardiac rehab for exercise, nutrition and lifestyle modifications             Core Components/Risk Factors/Patient Goals at Discharge (Final Review):   Goals and Risk Factor Review - 12/10/19  1412      Core Components/Risk Factors/Patient Goals Review   Personal Goals Review Weight Management/Obesity;Heart Failure;Lipids;Hypertension;Stress    Review Davidson is doing well with exercise. Reynol's vital sing have been stable.    Expected Outcomes Carmino will continue to participate in phase 2 cardiac rehab for exercise, nutrition and lifestyle modifications           ITP Comments:  ITP Comments    Row Name 11/28/19 1154 12/03/19 0803 12/10/19 1403       ITP Comments Dr. Fransico Him, Medical Director Lennette Bihari started exercise on 12/03/19 and did well with exercise 30 Day ITP Review. Patient has good attendance and participation in phase 2 cardiac rehab            Comments: See ITP Comments.Barnet Pall, RN,BSN 12/12/2019 10:39 AM

## 2019-12-11 ENCOUNTER — Other Ambulatory Visit: Payer: Self-pay

## 2019-12-11 ENCOUNTER — Encounter (HOSPITAL_COMMUNITY)
Admission: RE | Admit: 2019-12-11 | Discharge: 2019-12-11 | Disposition: A | Discharge status: Home / Self Care | Payer: BC Managed Care – PPO | Source: Ambulatory Visit | Attending: Cardiology | Admitting: Cardiology

## 2019-12-11 DIAGNOSIS — I214 Non-ST elevation (NSTEMI) myocardial infarction: Secondary | ICD-10-CM

## 2019-12-11 DIAGNOSIS — Z955 Presence of coronary angioplasty implant and graft: Secondary | ICD-10-CM | POA: Diagnosis not present

## 2019-12-13 ENCOUNTER — Encounter (HOSPITAL_COMMUNITY)
Admission: RE | Admit: 2019-12-13 | Discharge: 2019-12-13 | Disposition: A | Discharge status: Home / Self Care | Payer: BC Managed Care – PPO | Source: Ambulatory Visit | Attending: Cardiology | Admitting: Cardiology

## 2019-12-13 ENCOUNTER — Other Ambulatory Visit: Payer: Self-pay

## 2019-12-13 DIAGNOSIS — I214 Non-ST elevation (NSTEMI) myocardial infarction: Secondary | ICD-10-CM | POA: Diagnosis not present

## 2019-12-13 DIAGNOSIS — Z955 Presence of coronary angioplasty implant and graft: Secondary | ICD-10-CM | POA: Insufficient documentation

## 2019-12-16 ENCOUNTER — Other Ambulatory Visit: Payer: Self-pay

## 2019-12-16 ENCOUNTER — Encounter (HOSPITAL_COMMUNITY)
Admission: RE | Admit: 2019-12-16 | Discharge: 2019-12-16 | Disposition: A | Discharge status: Home / Self Care | Payer: BC Managed Care – PPO | Source: Ambulatory Visit | Attending: Cardiology | Admitting: Cardiology

## 2019-12-16 DIAGNOSIS — I214 Non-ST elevation (NSTEMI) myocardial infarction: Secondary | ICD-10-CM | POA: Diagnosis not present

## 2019-12-16 DIAGNOSIS — Z955 Presence of coronary angioplasty implant and graft: Secondary | ICD-10-CM | POA: Diagnosis not present

## 2019-12-18 ENCOUNTER — Other Ambulatory Visit: Payer: Self-pay

## 2019-12-18 ENCOUNTER — Encounter (HOSPITAL_COMMUNITY)
Admission: RE | Admit: 2019-12-18 | Discharge: 2019-12-18 | Disposition: A | Discharge status: Home / Self Care | Payer: BC Managed Care – PPO | Source: Ambulatory Visit | Attending: Cardiology | Admitting: Cardiology

## 2019-12-18 DIAGNOSIS — I214 Non-ST elevation (NSTEMI) myocardial infarction: Secondary | ICD-10-CM

## 2019-12-18 DIAGNOSIS — Z955 Presence of coronary angioplasty implant and graft: Secondary | ICD-10-CM | POA: Diagnosis not present

## 2019-12-19 ENCOUNTER — Other Ambulatory Visit: Payer: Self-pay | Admitting: Student

## 2019-12-20 ENCOUNTER — Other Ambulatory Visit: Payer: Self-pay

## 2019-12-20 ENCOUNTER — Encounter (HOSPITAL_COMMUNITY)
Admission: RE | Admit: 2019-12-20 | Discharge: 2019-12-20 | Disposition: A | Discharge status: Home / Self Care | Payer: BC Managed Care – PPO | Source: Ambulatory Visit | Attending: Cardiology | Admitting: Cardiology

## 2019-12-20 DIAGNOSIS — Z955 Presence of coronary angioplasty implant and graft: Secondary | ICD-10-CM | POA: Diagnosis not present

## 2019-12-20 DIAGNOSIS — I214 Non-ST elevation (NSTEMI) myocardial infarction: Secondary | ICD-10-CM | POA: Diagnosis not present

## 2019-12-20 NOTE — Progress Notes (Signed)
Reviewed home exercise guidelines with patient including endpoints, temperature precautions, target heart rate and rate of perceived exertion. Patient is currently walking 20 minutes twice/day, 3 days/week  as his mode of home exercise. Patient voices understanding of instructions given.  Sol Passer, MS, ACSM CEP

## 2019-12-23 ENCOUNTER — Other Ambulatory Visit: Payer: Self-pay

## 2019-12-23 ENCOUNTER — Encounter (HOSPITAL_COMMUNITY)
Admission: RE | Admit: 2019-12-23 | Discharge: 2019-12-23 | Disposition: A | Discharge status: Home / Self Care | Payer: BC Managed Care – PPO | Source: Ambulatory Visit | Attending: Cardiology | Admitting: Cardiology

## 2019-12-23 DIAGNOSIS — Z955 Presence of coronary angioplasty implant and graft: Secondary | ICD-10-CM | POA: Diagnosis not present

## 2019-12-23 DIAGNOSIS — I214 Non-ST elevation (NSTEMI) myocardial infarction: Secondary | ICD-10-CM

## 2019-12-25 ENCOUNTER — Other Ambulatory Visit: Payer: Self-pay

## 2019-12-25 ENCOUNTER — Encounter (HOSPITAL_COMMUNITY)
Admission: RE | Admit: 2019-12-25 | Discharge: 2019-12-25 | Disposition: A | Discharge status: Home / Self Care | Payer: BC Managed Care – PPO | Source: Ambulatory Visit | Attending: Cardiology | Admitting: Cardiology

## 2019-12-25 DIAGNOSIS — I214 Non-ST elevation (NSTEMI) myocardial infarction: Secondary | ICD-10-CM

## 2019-12-25 DIAGNOSIS — Z955 Presence of coronary angioplasty implant and graft: Secondary | ICD-10-CM

## 2019-12-27 ENCOUNTER — Other Ambulatory Visit: Payer: Self-pay

## 2019-12-27 ENCOUNTER — Encounter (HOSPITAL_COMMUNITY)
Admission: RE | Admit: 2019-12-27 | Discharge: 2019-12-27 | Disposition: A | Discharge status: Home / Self Care | Payer: BC Managed Care – PPO | Source: Ambulatory Visit | Attending: Cardiology | Admitting: Cardiology

## 2019-12-27 DIAGNOSIS — I214 Non-ST elevation (NSTEMI) myocardial infarction: Secondary | ICD-10-CM

## 2019-12-27 DIAGNOSIS — Z955 Presence of coronary angioplasty implant and graft: Secondary | ICD-10-CM | POA: Diagnosis not present

## 2019-12-27 NOTE — Progress Notes (Signed)
Nutrition Note: Follow up  Spoke with patient today about heart healthy nutrition including reading labels and choosing lower sodium foods. Recommended 1500-2000 mg sodium per day. Encouraged cooking at home most of the time. He states eating a lot of cheese and realizing he should cut back on it. Recommended eating pattern of lean protein at every meal or snack, 2 servings fatty fish per week, 3-5 servings non starchy vegetables per day, 2-3 servings fruit per day, 1 oz nuts per day. Provided handouts and recipes. Collaborated with pt for goal setting. Pt verbalized understanding.   Michaele Offer, MS, RDN, LDN

## 2019-12-30 ENCOUNTER — Encounter (HOSPITAL_COMMUNITY)
Admission: RE | Admit: 2019-12-30 | Discharge: 2019-12-30 | Disposition: A | Discharge status: Home / Self Care | Payer: BC Managed Care – PPO | Source: Ambulatory Visit | Attending: Cardiology | Admitting: Cardiology

## 2019-12-30 ENCOUNTER — Other Ambulatory Visit: Payer: Self-pay

## 2019-12-30 DIAGNOSIS — I214 Non-ST elevation (NSTEMI) myocardial infarction: Secondary | ICD-10-CM

## 2019-12-30 DIAGNOSIS — Z955 Presence of coronary angioplasty implant and graft: Secondary | ICD-10-CM | POA: Diagnosis not present

## 2020-01-01 ENCOUNTER — Other Ambulatory Visit: Payer: Self-pay

## 2020-01-01 ENCOUNTER — Encounter (HOSPITAL_COMMUNITY)
Admission: RE | Admit: 2020-01-01 | Discharge: 2020-01-01 | Disposition: A | Discharge status: Home / Self Care | Payer: BC Managed Care – PPO | Source: Ambulatory Visit | Attending: Cardiology | Admitting: Cardiology

## 2020-01-01 ENCOUNTER — Ambulatory Visit (HOSPITAL_COMMUNITY): Payer: BC Managed Care – PPO | Attending: Cardiology

## 2020-01-01 DIAGNOSIS — I214 Non-ST elevation (NSTEMI) myocardial infarction: Secondary | ICD-10-CM | POA: Diagnosis not present

## 2020-01-01 DIAGNOSIS — Z9189 Other specified personal risk factors, not elsewhere classified: Secondary | ICD-10-CM | POA: Insufficient documentation

## 2020-01-01 DIAGNOSIS — I472 Ventricular tachycardia, unspecified: Secondary | ICD-10-CM

## 2020-01-01 DIAGNOSIS — I25119 Atherosclerotic heart disease of native coronary artery with unspecified angina pectoris: Secondary | ICD-10-CM | POA: Diagnosis not present

## 2020-01-01 DIAGNOSIS — I255 Ischemic cardiomyopathy: Secondary | ICD-10-CM | POA: Insufficient documentation

## 2020-01-01 DIAGNOSIS — Z955 Presence of coronary angioplasty implant and graft: Secondary | ICD-10-CM

## 2020-01-01 DIAGNOSIS — I5041 Acute combined systolic (congestive) and diastolic (congestive) heart failure: Secondary | ICD-10-CM

## 2020-01-01 HISTORY — PX: TRANSTHORACIC ECHOCARDIOGRAM: SHX275

## 2020-01-01 LAB — ECHOCARDIOGRAM COMPLETE
Area-P 1/2: 2.45 cm2
S' Lateral: 2.6 cm

## 2020-01-03 ENCOUNTER — Encounter (HOSPITAL_COMMUNITY)
Admission: RE | Admit: 2020-01-03 | Discharge: 2020-01-03 | Disposition: A | Discharge status: Home / Self Care | Payer: BC Managed Care – PPO | Source: Ambulatory Visit | Attending: Cardiology | Admitting: Cardiology

## 2020-01-03 ENCOUNTER — Other Ambulatory Visit: Payer: Self-pay

## 2020-01-03 DIAGNOSIS — Z955 Presence of coronary angioplasty implant and graft: Secondary | ICD-10-CM

## 2020-01-03 DIAGNOSIS — I214 Non-ST elevation (NSTEMI) myocardial infarction: Secondary | ICD-10-CM

## 2020-01-05 NOTE — Progress Notes (Signed)
Cardiology Office Note   Date:  01/07/2020   ID:  NELSON NOONE, DOB 1966-01-07, MRN [id number]  PCP:  Lorrene Reid, PA-C  Cardiologist:  Glenetta Hew, MD EP: None  Chief Complaint  Patient presents with  . Follow-up    CAD, CHF      History of Present Illness: Edward Pierce is a 54 y.o. male with PMH of CAD s/p NSTEMI with PCI/DES to LAD c/b sustained VT 09/2019, chronic combined CHF/ICM, HTN, and HLD, who presents for routine follow-up.  He was last evaluated by cardiology at an outpatient visit with Dr. Ellyn Hack 10/2019, at which time he was continuing to recover well following his recent STEMI. He was discharged home with a LifeVest given NSTEMI c/b VT in the setting of ICM. Soft blood pressures limited titration of GDMT. He was recommended to repeat an echocardiogram 12/2019 which showed improvement in EF from 35% to 40-45% with ongoing RWMA, mild concentric LVH, indeterminate LV diastolic function, and mild dilation of the aortic root (26mm), with recommendation to consider a limited study with definity to r/o thrombus. His LifeVest was discontinued given improvement in his EF.   He presents today for routine follow-up after his repeat echocardiogram. We discussed his results - he is somewhat disappointed that his pumping function is not back to normal. He continues to work with cardiac rehab. He has no complaints of chest pain, SOB, DOE, dizziness, lightheadedness, syncope, palpitations, LE edema, orthopnea, or PND.    Past Medical History:  Diagnosis Date  . Coronary artery disease involving native coronary artery of native heart with angina pectoris ([place]) 10/03/2019   Cardiac Cath-PCI 10/02/2019:Culprit lesion mid LAD 99% heavily thrombotic lesion-->aspiration thrombectomy, DES PCI Synergy 3.0 x 23. 3.3 mm). Otherwise essentially normal coronaries with RI, LCx and RCA free of significant disease-RCA has mild proximal 20%.. Relatively normal LVEDP 7 mmHg.  Marland Kitchen History of  acute anterior wall MI (did not meet full STEMI criteria) 10/02/2019   associated with AIVR & Sustained VT - ? reperfusion / ischemic arrhythmias  . Hyperlipidemia with target LDL less than 70 01/19/2016  . Hypertriglyceridemia, familial 01/19/2016  . Ischemic cardiomyopathy 10/03/2019   TTE 10/02/2019: (Post anterior MI) EF roughly 35%. Mid-apical anteroseptal and inferoseptal akinesis. Apical inferior apical anterior and apical akinesis. GR 1 DD.   Marland Kitchen Sustained ventricular tachycardia ([place]) 10/03/2019   Admitted for ACS-troponin elevation, EKG with borderline anterior STEMI findings.  Had AIVR and sustained VT during stabilization overnight.    Past Surgical History:  Procedure Laterality Date  . CARDIAC CATHETERIZATION    . CORONARY STENT INTERVENTION N/A 10/02/2019   Procedure: CORONARY STENT INTERVENTION;  Surgeon: Lorretta Harp, MD;  Location: [facility]; Culprit lesion mid LAD 99% heavily thrombotic lesion-->aspiration thrombectomy, DES PCI Synergy 3.0 x 23. 3.3 mm).   . CORONARY THROMBECTOMY N/A 10/02/2019   Procedure: Coronary Thrombectomy;  Surgeon: Lorretta Harp, MD;  Location: [facility];  Service: Cardiovascular;; Heavily thrombotic 99% mLAD subTotal occlusion - Aspiration Thrombectomy of Large Red Thrombus  . GUM SURGERY  1979   gum graft  . HERNIA REPAIR     right side  . LEFT HEART CATH AND CORONARY ANGIOGRAPHY N/A 10/02/2019   Procedure: LEFT HEART CATH AND CORONARY ANGIOGRAPHY;  Surgeon: Lorretta Harp, MD;  Location: [facility];  Service: Cardiovascular;; Culprit lesion mid LAD 99% heavily thrombotic lesion-->aspiration thrombectomy, DES PCI. Otherwise essentially normal coronaries with RI, LCx and RCA free  of significant disease-RCA has mild proximal 20%.. Relatively normal LVEDP 7 mmHg.  Marland Kitchen TRANSTHORACIC ECHOCARDIOGRAM  10/02/2019    (Post anterior MI) EF roughly 35%. Mid-apical anteroseptal and inferoseptal akinesis. Apical inferior  apical anterior and apical akinesis. GR 1 DD. Otherwise normal valves, normal RV and atriae     Current Outpatient Medications  Medication Sig Dispense Refill  . aspirin EC 81 MG EC tablet Take 1 tablet (81 mg total) by mouth daily. Swallow whole. 30 tablet 11  . atorvastatin (LIPITOR) 80 MG tablet TAKE 1 TABLET (80 MG TOTAL) BY MOUTH AT BEDTIME 30 tablet 2  . nitroGLYCERIN (NITROSTAT) 0.4 MG SL tablet Place 1 tablet (0.4 mg total) under the tongue every 5 (five) minutes as needed for chest pain (CP or SOB). 25 tablet 2  . ticagrelor (BRILINTA) 90 MG TABS tablet Take 1 tablet (90 mg total) by mouth 2 (two) times daily. 60 tablet 11  . metoprolol succinate (TOPROL XL) 25 MG 24 hr tablet Take 0.5 tablets (12.5 mg total) by mouth daily. 45 tablet 3  . sacubitril-valsartan (ENTRESTO) 24-26 MG Take 1 tablet by mouth 2 (two) times daily. 60 tablet 11   No current facility-administered medications for this visit.    Allergies:   Patient has no known allergies.    Social History:  The patient  reports that he quit smoking about 23 years ago. His smoking use included cigarettes. He has a 3.25 pack-year smoking history. He has never used smokeless tobacco. He reports current alcohol use of about 3.0 standard drinks of alcohol per week. He reports that he does not use drugs.   Family History:  The patient's family history includes Breast cancer in his mother; COPD in his father; Cancer in his mother; Diabetes in his mother; Heart attack in his paternal grandfather; Heart disease in his father and mother; Hyperlipidemia in his father; Hypertension in his mother.    ROS:  Please see the history of present illness.   Otherwise, review of systems are positive for none.   All other systems are reviewed and negative.    PHYSICAL EXAM: VS:  BP (!) 114/58   Pulse (!) 55   Ht 6' (1.829 m)   Wt 198 lb 3.2 oz (89.9 kg)   BMI 26.88 kg/m  , BMI Body mass index is 26.88 kg/m. GEN: Well nourished, well  developed, in no acute distress HEENT: sclera anicteric Neck: no JVD, carotid bruits, or masses Cardiac: RRR; no murmurs, rubs, or gallops,no edema  Respiratory:  clear to auscultation bilaterally, normal work of breathing GI: soft, nontender, nondistended, + BS MS: no deformity or atrophy Skin: warm and dry, no rash Neuro:  Strength and sensation are intact Psych: euthymic mood, full affect   EKG:  EKG is not ordered today.   Recent Labs: 10/02/2019: Magnesium 1.8; TSH 1.807 10/03/2019: BUN 7; Creatinine, Ser 1.04; Hemoglobin 14.0; Platelets 185; Potassium 3.8; Sodium 137    Lipid Panel    Component Value Date/Time   CHOL 323 (H) 10/02/2019 0321   CHOL 176 08/29/2017 0919   TRIG 177 (H) 10/02/2019 0321   HDL 47 10/02/2019 0321   HDL 39 (L) 08/29/2017 0919   CHOLHDL 6.9 10/02/2019 0321   VLDL 35 10/02/2019 0321   LDLCALC 241 (H) 10/02/2019 0321   LDLCALC 87 08/29/2017 0919      Wt Readings from Last 3 Encounters:  01/07/20 198 lb 3.2 oz (89.9 kg)  12/06/19 201 lb (91.2 kg)  11/28/19 201 lb 11.5  oz (91.5 kg)      Other studies Reviewed: Additional studies/ records that were reviewed today include:   Echocardiogram 01/01/20: 1. Left ventricular ejection fraction, by estimation, is 40 to 45%. The  left ventricle has mildly decreased function. The left ventricle  demonstrates regional wall motion abnormalities (see scoring  diagram/findings for description). There is mild  concentric left ventricular hypertrophy. Left ventricular diastolic  parameters are indeterminate. There is akinesis of the left ventricular,  entire apical segment. There is akinesis of the left ventricular, mid  anteroseptal wall.  2. Right ventricular systolic function is normal. The right ventricular  size is normal.  3. The mitral valve is normal in structure. Trivial mitral valve  regurgitation. No evidence of mitral stenosis.  4. The aortic valve is normal in structure. Aortic valve  regurgitation is  trivial. No aortic stenosis is present.  5. Aortic dilatation noted. There is mild dilatation of the aortic root,  measuring 39 mm.  6. The inferior vena cava is normal in size with greater than 50%  respiratory variability, suggesting right atrial pressure of 3 mmHg.  7. Recommend limited study with definity to rule out apical thromubs.   Left heart catheterization 09/2019:  Mid LAD lesion is 99% stenosed.  A drug-eluting stent was successfully placed using a SYNERGY XD 3.0X20.  Post intervention, there is a 0% residual stenosis.  IMPRESSION: Successful mid LAD aspiration thrombectomy followed by PCI and drug-eluting stenting of a subtotally occluded highly thrombotic mid LAD stenosis.  He had no other significant CAD.  Visual inspection of his 2D echo revealed an EF in the 25 to 30% range with anteroapical akinesia.  He will need dual antiplatelet therapy uninterrupted for 12 months as well as 18 hours of IV Aggrastat.  He would benefit from being on a LifeVest for 3 months given his LAD infarct, severe LV dysfunction and arrhythmias overnight.  Otherwise, he will need guideline directed optimal medical therapy including high-dose statin, beta-blocker, ACE inhibitor as well.  He left the lab in stable condition.  Echocardiogram 09/2019: 1. Left ventricular ejection fraction, by estimation, is 35%. The left  ventricle has moderately decreased function. The left ventricle  demonstrates regional wall motion abnormalities with mid to apical  anteroseptal and inferoseptal akinesis. The apical  inferior wall, apical anterior wall, and the true apex are akinetic. No LV  thrombus. Left ventricular diastolic parameters are consistent with Grade  I diastolic dysfunction (impaired relaxation).  2. Right ventricular systolic function is normal. The right ventricular  size is normal. Tricuspid regurgitation signal is inadequate for assessing  PA pressure.  3. The mitral valve  is normal in structure. Trivial mitral valve  regurgitation. No evidence of mitral stenosis.  4. The aortic valve is tricuspid. Aortic valve regurgitation is not  visualized. No aortic stenosis is present.  5. The inferior vena cava is normal in size with greater than 50%  respiratory variability, suggesting right atrial pressure of 3 mmHg.     ASSESSMENT AND PLAN:  1. CAD s/p PCI/DES to LAD 09/2019: no anginal complaints - Continue aspirin and brilinta - Continue statin - Continue BBlocker  2. ICM/chronic combined CHF: EF 35% at the time of his NSTEMI 09/2019, improved to 40-45% on echo 01/01/20. No volume overload complaints. BP limits titration of GDMT - Will stop carvedilol and start metoprolol succinate 12.5mg  daily - Will stop losartan and transition to entresto - Will check BMET in 1 week for close monitoring - Will arrange a pharmacy  visit for entresto titration in 1 month  3. Sustained VT: occurred in the setting of NSTEMI 09/2019. No recurrence. LifeVest discontinued after recent echo showed improvement in EF from 35% to 40-45%.  - Continue carvedilol  4. HTN: BP 114/58 today - Continue metoprolol succinate and entresto as above  5. HLD: LDL 241 09/2019 - Will repeat FLP/LFTs - can check next week when he comes in for blood work next week for medication monitoring. Low threshold to refer to the lipid clinic for PSK9-inhibitor consideration if LDL not at goal of <70 - Continue atorvastatin   Current medicines are reviewed at length with the patient today.  The patient does not have concerns regarding medicines.  The following changes have been made:  As above  Labs/ tests ordered today include:   Orders Placed This Encounter  Procedures  . Comprehensive Metabolic Panel (CMET)  . Lipid panel     Disposition:   FU with Dr. Ellyn Hack in 3 months  Signed, Abigail Butts, PA-C  01/07/2020 11:38 AM

## 2020-01-06 ENCOUNTER — Encounter (HOSPITAL_COMMUNITY)
Admission: RE | Admit: 2020-01-06 | Discharge: 2020-01-06 | Disposition: A | Discharge status: Home / Self Care | Payer: BC Managed Care – PPO | Source: Ambulatory Visit | Attending: Cardiology | Admitting: Cardiology

## 2020-01-06 ENCOUNTER — Other Ambulatory Visit: Payer: Self-pay

## 2020-01-06 DIAGNOSIS — I214 Non-ST elevation (NSTEMI) myocardial infarction: Secondary | ICD-10-CM | POA: Diagnosis not present

## 2020-01-06 DIAGNOSIS — Z955 Presence of coronary angioplasty implant and graft: Secondary | ICD-10-CM

## 2020-01-07 ENCOUNTER — Encounter: Payer: Self-pay | Admitting: Medical

## 2020-01-07 ENCOUNTER — Ambulatory Visit (INDEPENDENT_AMBULATORY_CARE_PROVIDER_SITE_OTHER): Payer: BC Managed Care – PPO | Admitting: Medical

## 2020-01-07 VITALS — BP 114/58 | HR 55 | Ht 72.0 in | Wt 198.2 lb

## 2020-01-07 DIAGNOSIS — I255 Ischemic cardiomyopathy: Secondary | ICD-10-CM | POA: Diagnosis not present

## 2020-01-07 DIAGNOSIS — I25119 Atherosclerotic heart disease of native coronary artery with unspecified angina pectoris: Secondary | ICD-10-CM | POA: Diagnosis not present

## 2020-01-07 DIAGNOSIS — I5042 Chronic combined systolic (congestive) and diastolic (congestive) heart failure: Secondary | ICD-10-CM | POA: Diagnosis not present

## 2020-01-07 DIAGNOSIS — I472 Ventricular tachycardia, unspecified: Secondary | ICD-10-CM

## 2020-01-07 DIAGNOSIS — E785 Hyperlipidemia, unspecified: Secondary | ICD-10-CM

## 2020-01-07 DIAGNOSIS — I10 Essential (primary) hypertension: Secondary | ICD-10-CM

## 2020-01-07 MED ORDER — ENTRESTO 24-26 MG PO TABS: 1.0000 | ORAL_TABLET | Freq: Two times a day (BID) | ORAL | 11 refills | Status: DC

## 2020-01-07 MED ORDER — METOPROLOL SUCCINATE ER 25 MG PO TB24
12.5000 mg | ORAL_TABLET | Freq: Every day | ORAL | 3 refills | Status: DC
Start: 2020-01-07 — End: 2020-11-27

## 2020-01-07 NOTE — Patient Instructions (Signed)
Medication Instructions:  STOP- Carvedilol START- Metoprolol Succinate 12.5 mg by mouth daily STOP- Losartan START- Entresto 24/26 mg by mouth twice a day  *If you need a refill on your cardiac medications before your next appointment, please call your pharmacy*   Lab Work: Fasting Lipid and CMP in 1 week  If you have labs (blood work) drawn today and your tests are completely normal, you will receive your results only by: Marland Kitchen MyChart Message (if you have MyChart) OR . A paper copy in the mail If you have any lab test that is abnormal or we need to change your treatment, we will call you to review the results.   Testing/Procedures: None Ordered   Follow-Up: At [facility], you and your health needs are our priority.  As part of our continuing mission to provide you with exceptional heart care, we have created designated Provider Care Teams.  These Care Teams include your primary Cardiologist (physician) and Advanced Practice Providers (APPs -  Physician Assistants and Nurse Practitioners) who all work together to provide you with the care you need, when you need it.  We recommend signing up for the patient portal called "MyChart".  Sign up information is provided on this After Visit Summary.  MyChart is used to connect with patients for Virtual Visits (Telemedicine).  Patients are able to view lab/test results, encounter notes, upcoming appointments, etc.  Non-urgent messages can be sent to your provider as well.   To learn more about what you can do with MyChart, go to [email or web address].    Your next appointment:   3 month(s)  The format for your next appointment:   In Person  Provider:   You may see Glenetta Hew, MD or one of the following Advanced Practice Providers on your designated Care Team:    Rosaria Ferries, PA-C  Jory Sims, DNP, ANP    Other Instructions Your physician recommends that you schedule a follow-up appointment in: 1 Month Pharmacist for  medication management

## 2020-01-08 ENCOUNTER — Encounter (HOSPITAL_COMMUNITY)
Admission: RE | Admit: 2020-01-08 | Discharge: 2020-01-08 | Disposition: A | Discharge status: Home / Self Care | Payer: BC Managed Care – PPO | Source: Ambulatory Visit | Attending: Cardiology | Admitting: Cardiology

## 2020-01-08 ENCOUNTER — Other Ambulatory Visit: Payer: Self-pay

## 2020-01-08 DIAGNOSIS — I214 Non-ST elevation (NSTEMI) myocardial infarction: Secondary | ICD-10-CM

## 2020-01-08 DIAGNOSIS — I10 Essential (primary) hypertension: Secondary | ICD-10-CM | POA: Diagnosis not present

## 2020-01-08 DIAGNOSIS — Z955 Presence of coronary angioplasty implant and graft: Secondary | ICD-10-CM | POA: Diagnosis not present

## 2020-01-08 DIAGNOSIS — E785 Hyperlipidemia, unspecified: Secondary | ICD-10-CM | POA: Diagnosis not present

## 2020-01-08 LAB — COMPREHENSIVE METABOLIC PANEL
ALT: 28 IU/L (ref 0–44)
AST: 31 IU/L (ref 0–40)
Albumin/Globulin Ratio: 2.2 (ref 1.2–2.2)
Albumin: 4.6 g/dL (ref 3.8–4.9)
Alkaline Phosphatase: 92 IU/L (ref 44–121)
BUN/Creatinine Ratio: 14 (ref 9–20)
BUN: 13 mg/dL (ref 6–24)
Bilirubin Total: 1 mg/dL (ref 0.0–1.2)
CO2: 22 mmol/L (ref 20–29)
Calcium: 9.5 mg/dL (ref 8.7–10.2)
Chloride: 102 mmol/L (ref 96–106)
Creatinine, Ser: 0.9 mg/dL (ref 0.76–1.27)
GFR calc Af Amer: 112 mL/min/{1.73_m2} (ref >59)
GFR calc non Af Amer: 96 mL/min/{1.73_m2} (ref >59)
Globulin, Total: 2.1 g/dL (ref 1.5–4.5)
Glucose: 109 mg/dL — ABNORMAL HIGH (ref 65–99)
Potassium: 4.4 mmol/L (ref 3.5–5.2)
Sodium: 138 mmol/L (ref 134–144)
Total Protein: 6.7 g/dL (ref 6.0–8.5)

## 2020-01-08 LAB — LIPID PANEL
Chol/HDL Ratio: 3.9 ratio (ref 0.0–5.0)
Cholesterol, Total: 152 mg/dL (ref 100–199)
HDL: 39 mg/dL — ABNORMAL LOW (ref >39)
LDL Chol Calc (NIH): 87 mg/dL (ref 0–99)
Triglycerides: 146 mg/dL (ref 0–149)
VLDL Cholesterol Cal: 26 mg/dL (ref 5–40)

## 2020-01-08 NOTE — Progress Notes (Signed)
Cardiac Individual Treatment Plan  Patient Details  Name: Edward Pierce MRN: [id number] Date of Birth: 1965-11-05 Referring Provider:     CARDIAC REHAB PHASE II ORIENTATION from 11/28/2019 in [facility] [place]  Referring Provider Glenetta Hew MD      Initial Encounter Date:    CARDIAC REHAB PHASE II ORIENTATION from 11/28/2019 in [place]  Date 11/28/19      Visit Diagnosis: NSTEMI (non-ST elevated myocardial infarction) ([place]) 10/01/19  S/P DES LAD 10/01/19  Patient's Home Medications on Admission:  Current Outpatient Medications:  .  aspirin EC 81 MG EC tablet, Take 1 tablet (81 mg total) by mouth daily. Swallow whole., Disp: 30 tablet, Rfl: 11 .  atorvastatin (LIPITOR) 80 MG tablet, TAKE 1 TABLET (80 MG TOTAL) BY MOUTH AT BEDTIME, Disp: 30 tablet, Rfl: 2 .  metoprolol succinate (TOPROL XL) 25 MG 24 hr tablet, Take 0.5 tablets (12.5 mg total) by mouth daily., Disp: 45 tablet, Rfl: 3 .  nitroGLYCERIN (NITROSTAT) 0.4 MG SL tablet, Place 1 tablet (0.4 mg total) under the tongue every 5 (five) minutes as needed for chest pain (CP or SOB)., Disp: 25 tablet, Rfl: 2 .  sacubitril-valsartan (ENTRESTO) 24-26 MG, Take 1 tablet by mouth 2 (two) times daily., Disp: 60 tablet, Rfl: 11 .  ticagrelor (BRILINTA) 90 MG TABS tablet, Take 1 tablet (90 mg total) by mouth 2 (two) times daily., Disp: 60 tablet, Rfl: 11  Past Medical History: Past Medical History:  Diagnosis Date  . Coronary artery disease involving native coronary artery of native heart with angina pectoris ([place]) 10/03/2019   Cardiac Cath-PCI 10/02/2019:Culprit lesion mid LAD 99% heavily thrombotic lesion-->aspiration thrombectomy, DES PCI Synergy 3.0 x 23. 3.3 mm). Otherwise essentially normal coronaries with RI, LCx and RCA free of significant disease-RCA has mild proximal 20%.. Relatively normal LVEDP 7 mmHg.  Marland Kitchen History of acute anterior wall MI (did not meet full STEMI  criteria) 10/02/2019   associated with AIVR & Sustained VT - ? reperfusion / ischemic arrhythmias  . Hyperlipidemia with target LDL less than 70 01/19/2016  . Hypertriglyceridemia, familial 01/19/2016  . Ischemic cardiomyopathy 10/03/2019   TTE 10/02/2019: (Post anterior MI) EF roughly 35%. Mid-apical anteroseptal and inferoseptal akinesis. Apical inferior apical anterior and apical akinesis. GR 1 DD.   Marland Kitchen Sustained ventricular tachycardia ([place]) 10/03/2019   Admitted for ACS-troponin elevation, EKG with borderline anterior STEMI findings.  Had AIVR and sustained VT during stabilization overnight.    Tobacco Use: Social History   Tobacco Use  Smoking Status Former Smoker  . Packs/day: 0.25  . Years: 13.00  . Pack years: 3.25  . Types: Cigarettes  . Quit date: 02/12/1996  . Years since quitting: 23.9  Smokeless Tobacco Never Used    Labs: Recent Chemical engineer    Labs for ITP Cardiac and Pulmonary Rehab Latest Ref Rng & Units 07/18/2016 08/29/2017 10/01/2019 10/02/2019 01/08/2020   Cholestrol 100 - 199 mg/dL 177 176 - 323(H) 152   LDLCALC 0 - 99 mg/dL 106(H) 87 - 241(H) 87   HDL >39 mg/dL 39(L) 39(L) - 47 39(L)   Trlycerides 0 - 149 mg/dL 160(H) 249(H) - 177(H) 146   Hemoglobin A1c 4.8 - 5.6 % - 5.6 5.6 - -      Capillary Blood Glucose: No results found for: GLUCAP   Exercise Target Goals: Exercise Program Goal: Individual exercise prescription set using results from initial 6 min walk test and THRR while considering  patient's activity barriers and safety.   Exercise Prescription Goal: Starting with aerobic activity 30 plus minutes a day, 3 days per week for initial exercise prescription. Provide home exercise prescription and guidelines that participant acknowledges understanding prior to discharge.  Activity Barriers & Risk Stratification:  Activity Barriers & Cardiac Risk Stratification - 11/28/19 1207      Activity Barriers & Cardiac Risk Stratification   Activity  Barriers Back Problems;Arthritis    Cardiac Risk Stratification High           6 Minute Walk:  6 Minute Walk    Row Name 11/28/19 1206         6 Minute Walk   Phase Initial     Distance 1800 feet     Walk Time 6 minutes     # of Rest Breaks 0     MPH 3.4     METS 4.7     RPE 12     Perceived Dyspnea  0     VO2 Peak 16.6     Symptoms No     Resting HR 67 bpm     Resting BP 102/60     Resting Oxygen Saturation  98 %     Exercise Oxygen Saturation  during 6 min walk 99 %     Max Ex. HR 99 bpm     Max Ex. BP 130/80     2 Minute Post BP 122/80            Oxygen Initial Assessment:   Oxygen Re-Evaluation:   Oxygen Discharge (Final Oxygen Re-Evaluation):   Initial Exercise Prescription:  Initial Exercise Prescription - 11/28/19 1200      Date of Initial Exercise RX and Referring Provider   Date 11/28/19    Referring Provider Glenetta Hew MD    Expected Discharge Date 01/24/20      Treadmill   MPH 2.8    Grade 0    Minutes 15    METs 3.14      Bike   Level 1.5    Watts 35    Minutes 15    METs 3      Prescription Details   Frequency (times per week) 3x    Duration Progress to 10 minutes continuous walking  at current work load and total walking time to 30-45 min      Intensity   THRR 40-80% of Max Heartrate 66-133    Ratings of Perceived Exertion 11-13    Perceived Dyspnea 0-4      Progression   Progression Continue progressive overload as per policy without signs/symptoms or physical distress.      Resistance Training   Training Prescription Yes    Weight 4lbs    Reps 10-15           Perform Capillary Blood Glucose checks as needed.  Exercise Prescription Changes:   Exercise Prescription Changes    Row Name 12/02/19 1000 12/11/19 0658 12/30/19 1059 01/08/20 1056       Response to Exercise   Blood Pressure (Admit) 110/70 118/80 98/64 112/70    Blood Pressure (Exercise) 114/72 134/80 110/70 132/78    Blood Pressure (Exit)  120/74 106/64 98/60 108/60    Heart Rate (Admit) 70 bpm 76 bpm 75 bpm 72 bpm    Heart Rate (Exercise) 95 bpm 106 bpm 92 bpm 90 bpm    Heart Rate (Exit) 69 bpm 75 bpm 62 bpm 62 bpm    Rating of Perceived Exertion (  Exercise) 13 12 12 14     Perceived Dyspnea (Exercise) 0 0 0 0    Symptoms None None None None    Comments Pt's first day of exercise None None None    Duration Progress to 10 minutes continuous walking  at current work load and total walking time to 30-45 min Progress to 30 minutes of  aerobic without signs/symptoms of physical distress Progress to 30 minutes of  aerobic without signs/symptoms of physical distress Progress to 30 minutes of  aerobic without signs/symptoms of physical distress    Intensity THRR unchanged THRR unchanged THRR unchanged THRR unchanged      Progression   Progression Continue to progress workloads to maintain intensity without signs/symptoms of physical distress. Continue to progress workloads to maintain intensity without signs/symptoms of physical distress. Continue to progress workloads to maintain intensity without signs/symptoms of physical distress. Continue to progress workloads to maintain intensity without signs/symptoms of physical distress.    Average METs 2.82 3.75 3.85 3.91      Resistance Training   Training Prescription Yes No Yes No    Weight 4lbs -- 6lbs --    Reps 10-15 -- 10-15 --    Time 10 Minutes -- 10 Minutes --      Interval Training   Interval Training -- -- No No      Treadmill   MPH 2.8 2.8 2.8 3    Grade 0 2 2 2     Minutes 15 15 15 15     METs 3.14 3.91 3.91 4.12      Bike   Level 1.5 2.5 3 3.2    Watts 30 40 45 45    Minutes 15 15 15 15     METs 2.5 2.6 3.8 3.7      Home Exercise Plan   Plans to continue exercise at -- -- Home (comment)  walking Home (comment)  Walking    Frequency -- -- Add 3 additional days to program exercise sessions. Add 3 additional days to program exercise sessions.    Initial Home Exercises  Provided -- -- 12/20/19 12/20/19           Exercise Comments:   Exercise Comments    Row Name 12/02/19 1004 12/12/19 0659 12/20/19 0908 01/09/20 1101     Exercise Comments Pt's first day of exercise. Pt oriented to exercise equipment. Pt responded well to workloads. Will continue to monitor and progress pt as tolerated. Pt tolerating exercise prescription and workload increases well. Will follow up with pt regarding home exercise plan. Will continue to monitor. Reviewed home exercise guidelines with patient. Reviewed METs and Goals with pt. Pt has been able to stop wearing lifevest and has new motivation to continue to exercise at home.           Exercise Goals and Review:   Exercise Goals    Row Name 11/28/19 1207             Exercise Goals   Increase Physical Activity Yes       Intervention Provide advice, education, support and counseling about physical activity/exercise needs.;Develop an individualized exercise prescription for aerobic and resistive training based on initial evaluation findings, risk stratification, comorbidities and participant's personal goals.       Expected Outcomes Short Term: Attend rehab on a regular basis to increase amount of physical activity.;Long Term: Add in home exercise to make exercise part of routine and to increase amount of physical activity.;Long Term: Exercising regularly at least 3-5 days  a week.       Increase Strength and Stamina Yes       Intervention Provide advice, education, support and counseling about physical activity/exercise needs.;Develop an individualized exercise prescription for aerobic and resistive training based on initial evaluation findings, risk stratification, comorbidities and participant's personal goals.       Expected Outcomes Short Term: Increase workloads from initial exercise prescription for resistance, speed, and METs.;Short Term: Perform resistance training exercises routinely during rehab and add in resistance  training at home;Long Term: Improve cardiorespiratory fitness, muscular endurance and strength as measured by increased METs and functional capacity (6MWT)       Able to understand and use rate of perceived exertion (RPE) scale Yes       Intervention Provide education and explanation on how to use RPE scale       Expected Outcomes Short Term: Able to use RPE daily in rehab to express subjective intensity level;Long Term:  Able to use RPE to guide intensity level when exercising independently       Knowledge and understanding of Target Heart Rate Range (THRR) Yes       Intervention Provide education and explanation of THRR including how the numbers were predicted and where they are located for reference       Expected Outcomes Short Term: Able to state/look up THRR;Long Term: Able to use THRR to govern intensity when exercising independently;Short Term: Able to use daily as guideline for intensity in rehab       Able to check pulse independently Yes       Intervention Provide education and demonstration on how to check pulse in carotid and radial arteries.;Review the importance of being able to check your own pulse for safety during independent exercise       Expected Outcomes Short Term: Able to explain why pulse checking is important during independent exercise;Long Term: Able to check pulse independently and accurately       Understanding of Exercise Prescription Yes       Intervention Provide education, explanation, and written materials on patient's individual exercise prescription       Expected Outcomes Short Term: Able to explain program exercise prescription;Long Term: Able to explain home exercise prescription to exercise independently              Exercise Goals Re-Evaluation :  Exercise Goals Re-Evaluation    Row Name 12/02/19 1005 12/20/19 0908 01/09/20 1104         Exercise Goal Re-Evaluation   Exercise Goals Review Increase Physical Activity;Knowledge and understanding of  Target Heart Rate Range (THRR);Able to understand and use rate of perceived exertion (RPE) scale Increase Physical Activity;Knowledge and understanding of Target Heart Rate Range (THRR);Able to understand and use rate of perceived exertion (RPE) scale;Understanding of Exercise Prescription;Increase Strength and Stamina Increase Physical Activity;Knowledge and understanding of Target Heart Rate Range (THRR);Able to understand and use rate of perceived exertion (RPE) scale;Understanding of Exercise Prescription;Increase Strength and Stamina     Comments Pt's first day of exercise. Pt oriented to equipment. Pt responded well with exercise, was able to exercise for 30 minutes with no difficulty. Reviewed home exericse guidelines with patient. Patient is walking 20 minutes BID, 3 days/week as his mode of home exercise. Spoke with pt regarding exercising at home and continuing to exercise after cardiac rehab. Pt will start to make a plan for exercise once he is done with the program and working more. Also spoke with pt regarding progressing workloads and  pt is responding well to increases.     Expected Outcomes Pt will continue to increase strength and stamina. Will follow up with home exercise plan. Patient will continue walking 3 days/week in addition to exercise at cardiac rehab to help increase strength and stamina. Pt will walk 3 days a week for 30-45 minutes.             Discharge Exercise Prescription (Final Exercise Prescription Changes):  Exercise Prescription Changes - 01/08/20 1056      Response to Exercise   Blood Pressure (Admit) 112/70    Blood Pressure (Exercise) 132/78    Blood Pressure (Exit) 108/60    Heart Rate (Admit) 72 bpm    Heart Rate (Exercise) 90 bpm    Heart Rate (Exit) 62 bpm    Rating of Perceived Exertion (Exercise) 14    Perceived Dyspnea (Exercise) 0    Symptoms None    Comments None    Duration Progress to 30 minutes of  aerobic without signs/symptoms of physical  distress    Intensity THRR unchanged      Progression   Progression Continue to progress workloads to maintain intensity without signs/symptoms of physical distress.    Average METs 3.91      Resistance Training   Training Prescription No      Interval Training   Interval Training No      Treadmill   MPH 3    Grade 2    Minutes 15    METs 4.12      Bike   Level 3.2    Watts 45    Minutes 15    METs 3.7      Home Exercise Plan   Plans to continue exercise at Home (comment)   Walking   Frequency Add 3 additional days to program exercise sessions.    Initial Home Exercises Provided 12/20/19           Nutrition:  Target Goals: Understanding of nutrition guidelines, daily intake of sodium 1500mg , cholesterol 200mg , calories 30% from fat and 7% or less from saturated fats, daily to have 5 or more servings of fruits and vegetables.  Biometrics:  Pre Biometrics - 11/28/19 1208      Pre Biometrics   Height 6' 0.25" (1.835 m)    Weight 91.5 kg    Waist Circumference 40 inches    Hip Circumference 39.5 inches    Waist to Hip Ratio 1.01 %    BMI (Calculated) 27.17    Triceps Skinfold 12 mm    % Body Fat 25.6 %    Grip Strength 60 kg    Flexibility 15 in    Single Leg Stand 25.62 seconds            Nutrition Therapy Plan and Nutrition Goals:  Nutrition Therapy & Goals - 12/06/19 1328      Nutrition Therapy   Diet Heart healthy/low sodium      Personal Nutrition Goals   Nutrition Goal Pt to build a healthy plate including vegetables, fruits, whole grains, and low-fat dairy products in a heart healthy meal plan.    Personal Goal #2 Pt to eat a variety of non-starchy vegetables.    Personal Goal #3 Pt to cut down on meats high in saturated fat.    Personal Goal #4 Pt to choose fish more often      Intervention Plan   Intervention Prescribe, educate and counsel regarding individualized specific dietary modifications aiming towards targeted core components  such  as weight, hypertension, lipid management, diabetes, heart failure and other comorbidities.    Expected Outcomes Short Term Goal: Understand basic principles of dietary content, such as calories, fat, sodium, cholesterol and nutrients.           Nutrition Assessments:  Nutrition Assessments - 12/03/19 1008      MEDFICTS Scores   Pre Score 0           Nutrition Goals Re-Evaluation:  Nutrition Goals Re-Evaluation    [place] Name 12/06/19 1328 01/07/20 1436           Goals   Current Weight 201 lb (91.2 kg) 199 lb 15.3 oz (90.7 kg)      Nutrition Goal -- Pt to build a healthy plate including vegetables, fruits, whole grains, and low-fat dairy products in a heart healthy meal plan.      Comment -- Reviewed label reading with pt, he continues to follow a heart healthy diet, weight loss 2 lbs        Personal Goal #2 Re-Evaluation   Personal Goal #2 -- Pt to eat a variety of non-starchy vegetables.        Personal Goal #3 Re-Evaluation   Personal Goal #3 -- Pt to cut down on meats high in saturated fat.        Personal Goal #4 Re-Evaluation   Personal Goal #4 -- Pt to choose fish more often             Nutrition Goals Discharge (Final Nutrition Goals Re-Evaluation):  Nutrition Goals Re-Evaluation - 01/07/20 1436      Goals   Current Weight 199 lb 15.3 oz (90.7 kg)    Nutrition Goal Pt to build a healthy plate including vegetables, fruits, whole grains, and low-fat dairy products in a heart healthy meal plan.    Comment Reviewed label reading with pt, he continues to follow a heart healthy diet, weight loss 2 lbs      Personal Goal #2 Re-Evaluation   Personal Goal #2 Pt to eat a variety of non-starchy vegetables.      Personal Goal #3 Re-Evaluation   Personal Goal #3 Pt to cut down on meats high in saturated fat.      Personal Goal #4 Re-Evaluation   Personal Goal #4 Pt to choose fish more often           Psychosocial: Target Goals: Acknowledge presence or absence  of significant depression and/or stress, maximize coping skills, provide positive support system. Participant is able to verbalize types and ability to use techniques and skills needed for reducing stress and depression.  Initial Review & Psychosocial Screening:  Initial Psych Review & Screening - 11/28/19 1348      Initial Review   Current issues with Current Stress Concerns    Source of Stress Concerns Chronic Illness      Family Dynamics   Good Support System? Yes   Horris has his wife and two children for support     Barriers   Psychosocial barriers to participate in program There are no identifiable barriers or psychosocial needs.      Screening Interventions   Interventions Encouraged to exercise    Expected Outcomes Long Term Goal: Stressors or current issues are controlled or eliminated.           Quality of Life Scores:  Quality of Life - 11/28/19 1214      Quality of Life   Select Quality of Life  Quality of Life Scores   Health/Function Pre 22.13 %    Socioeconomic Pre 21.31 %    Psych/Spiritual Pre 23.14 %    Family Pre 24.9 %    GLOBAL Pre 22.54 %          Scores of 19 and below usually indicate a poorer quality of life in these areas.  A difference of  2-3 points is a clinically meaningful difference.  A difference of 2-3 points in the total score of the Quality of Life Index has been associated with significant improvement in overall quality of life, self-image, physical symptoms, and general health in studies assessing change in quality of life.  PHQ-9: Recent Review Flowsheet Data    Depression screen [facility] 2/9 11/28/2019 08/29/2017 02/28/2017 02/28/2017 07/18/2016   Decreased Interest 0 0 0 0 0   Down, Depressed, Hopeless 0 0 0 0 0   PHQ - 2 Score 0 0 0 0 0   Altered sleeping - 0 0 - -   Tired, decreased energy - 0 0 - -   Change in appetite - 0 0 - -   Feeling bad or failure about yourself  - 0 0 - -   Trouble concentrating - 0 0 - -   Moving slowly  or fidgety/restless - 0 0 - -   Suicidal thoughts - 0 0 - -   PHQ-9 Score - 0 0 - -   Difficult doing work/chores - Not difficult at all Not difficult at all - -     Interpretation of Total Score  Total Score Depression Severity:  1-4 = Minimal depression, 5-9 = Mild depression, 10-14 = Moderate depression, 15-19 = Moderately severe depression, 20-27 = Severe depression   Psychosocial Evaluation and Intervention:   Psychosocial Re-Evaluation:  Psychosocial Re-Evaluation    Row Name 12/10/19 1406 12/10/19 1410 01/08/20 1639         Psychosocial Re-Evaluation   Current issues with Current Sleep Concerns (P)  Current Stress Concerns Current Stress Concerns     Comments Jordon continues to have stress concerns due to his recent hospitalization. (P)  Greogry has not voiced any increased stressors or concerns Cecil has not voiced any increased stressors or concerns     Interventions -- Stress management education;Encouraged to attend Cardiac Rehabilitation for the exercise Stress management education;Encouraged to attend Cardiac Rehabilitation for the exercise     Continue Psychosocial Services  -- No Follow up required No Follow up required     Comments -- Will continue to monitor and offer support as needed Will continue to monitor and offer support as needed. Graysyn is feeling better as his life vest has been discontinued       Initial Review   Source of Stress Concerns -- Chronic Illness Chronic Illness            Psychosocial Discharge (Final Psychosocial Re-Evaluation):  Psychosocial Re-Evaluation - 01/08/20 1639      Psychosocial Re-Evaluation   Current issues with Current Stress Concerns    Comments Yamen has not voiced any increased stressors or concerns    Interventions Stress management education;Encouraged to attend Cardiac Rehabilitation for the exercise    Continue Psychosocial Services  No Follow up required    Comments Will continue to monitor and offer support as  needed. Makyi is feeling better as his life vest has been discontinued      Initial Review   Source of Stress Concerns Chronic Illness  Vocational Rehabilitation: Provide vocational rehab assistance to qualifying candidates.   Vocational Rehab Evaluation & Intervention:  Vocational Rehab - 11/28/19 1351      Initial Vocational Rehab Evaluation & Intervention   Assessment shows need for Vocational Rehabilitation No   Cache is returning to work and does not need vocational rehab at this time          Education: Education Goals: Education classes will be provided on a weekly basis, covering required topics. Participant will state understanding/return demonstration of topics presented.  Learning Barriers/Preferences:  Learning Barriers/Preferences - 11/28/19 1216      Learning Barriers/Preferences   Learning Barriers None    Learning Preferences Written Material;Individual Instruction;Group Instruction           Education Topics: Hypertension, Hypertension Reduction -Define heart disease and high blood pressure. Discus how high blood pressure affects the body and ways to reduce high blood pressure.   Exercise and Your Heart -Discuss why it is important to exercise, the FITT principles of exercise, normal and abnormal responses to exercise, and how to exercise safely.   Angina -Discuss definition of angina, causes of angina, treatment of angina, and how to decrease risk of having angina.   Cardiac Medications -Review what the following cardiac medications are used for, how they affect the body, and side effects that may occur when taking the medications.  Medications include Aspirin, Beta blockers, calcium channel blockers, ACE Inhibitors, angiotensin receptor blockers, diuretics, digoxin, and antihyperlipidemics.   Congestive Heart Failure -Discuss the definition of CHF, how to live with CHF, the signs and symptoms of CHF, and how keep track of weight and  sodium intake.   Heart Disease and Intimacy -Discus the effect sexual activity has on the heart, how changes occur during intimacy as we age, and safety during sexual activity.   Smoking Cessation / COPD -Discuss different methods to quit smoking, the health benefits of quitting smoking, and the definition of COPD.   Nutrition I: Fats -Discuss the types of cholesterol, what cholesterol does to the heart, and how cholesterol levels can be controlled.   Nutrition II: Labels -Discuss the different components of food labels and how to read food label   Heart Parts/Heart Disease and PAD -Discuss the anatomy of the heart, the pathway of blood circulation through the heart, and these are affected by heart disease.   Stress I: Signs and Symptoms -Discuss the causes of stress, how stress may lead to anxiety and depression, and ways to limit stress.   Stress II: Relaxation -Discuss different types of relaxation techniques to limit stress.   Warning Signs of Stroke / TIA -Discuss definition of a stroke, what the signs and symptoms are of a stroke, and how to identify when someone is having stroke.   Knowledge Questionnaire Score:  Knowledge Questionnaire Score - 11/28/19 1214      Knowledge Questionnaire Score   Pre Score 21/24           Core Components/Risk Factors/Patient Goals at Admission:  Personal Goals and Risk Factors at Admission - 11/28/19 1351      Core Components/Risk Factors/Patient Goals on Admission    Weight Management Yes;Weight Maintenance    Intervention Weight Management: Develop a combined nutrition and exercise program designed to reach desired caloric intake, while maintaining appropriate intake of nutrient and fiber, sodium and fats, and appropriate energy expenditure required for the weight goal.;Weight Management: Provide education and appropriate resources to help participant work on and attain dietary goals.  Expected Outcomes Weight Maintenance:  Understanding of the daily nutrition guidelines, which includes 25-35% calories from fat, 7% or less cal from saturated fats, less than 200mg  cholesterol, less than 1.5gm of sodium, & 5 or more servings of fruits and vegetables daily;Weight Loss: Understanding of general recommendations for a balanced deficit meal plan, which promotes 1-2 lb weight loss per week and includes a negative energy balance of [phone] kcal/d;Understanding recommendations for meals to include 15-35% energy as protein, 25-35% energy from fat, 35-60% energy from carbohydrates, less than 200mg  of dietary cholesterol, 20-35 gm of total fiber daily;Short Term: Continue to assess and modify interventions until short term weight is achieved    Heart Failure Yes    Intervention Provide a combined exercise and nutrition program that is supplemented with education, support and counseling about heart failure. Directed toward relieving symptoms such as shortness of breath, decreased exercise tolerance, and extremity edema.    Expected Outcomes Improve functional capacity of life;Short term: Attendance in program 2-3 days a week with increased exercise capacity. Reported lower sodium intake. Reported increased fruit and vegetable intake. Reports medication compliance.;Short term: Daily weights obtained and reported for increase. Utilizing diuretic protocols set by physician.;Long term: Adoption of self-care skills and reduction of barriers for early signs and symptoms recognition and intervention leading to self-care maintenance.    Hypertension Yes    Intervention Provide education on lifestyle modifcations including regular physical activity/exercise, weight management, moderate sodium restriction and increased consumption of fresh fruit, vegetables, and low fat dairy, alcohol moderation, and smoking cessation.;Monitor prescription use compliance.    Expected Outcomes Short Term: Continued assessment and intervention until BP is < 140/58mm HG  in hypertensive participants. < 130/35mm HG in hypertensive participants with diabetes, heart failure or chronic kidney disease.;Long Term: Maintenance of blood pressure at goal levels.    Lipids Yes    Intervention Provide education and support for participant on nutrition & aerobic/resistive exercise along with prescribed medications to achieve LDL 70mg , HDL >40mg .    Expected Outcomes Short Term: Participant states understanding of desired cholesterol values and is compliant with medications prescribed. Participant is following exercise prescription and nutrition guidelines.;Long Term: Cholesterol controlled with medications as prescribed, with individualized exercise RX and with personalized nutrition plan. Value goals: LDL < 70mg , HDL > 40 mg.    Stress Yes    Intervention Offer individual and/or small group education and counseling on adjustment to heart disease, stress management and health-related lifestyle change. Teach and support self-help strategies.;Refer participants experiencing significant psychosocial distress to appropriate mental health specialists for further evaluation and treatment. When possible, include family members and significant others in education/counseling sessions.    Expected Outcomes Short Term: Participant demonstrates changes in health-related behavior, relaxation and other stress management skills, ability to obtain effective social support, and compliance with psychotropic medications if prescribed.;Long Term: Emotional wellbeing is indicated by absence of clinically significant psychosocial distress or social isolation.           Core Components/Risk Factors/Patient Goals Review:   Goals and Risk Factor Review    Row Name 12/03/19 0805 12/10/19 1412 01/08/20 1639         Core Components/Risk Factors/Patient Goals Review   Personal Goals Review Weight Management/Obesity;Heart Failure;Lipids;Hypertension;Stress Weight Management/Obesity;Heart  Failure;Lipids;Hypertension;Stress Weight Management/Obesity;Heart Failure;Lipids;Hypertension;Stress     Review Tallis started exercise on 12/03/19. Franklyn did well with exercise. Erico is doing well with exercise. Richardo's vital sing have been stable. Saagar is doing well with exercise. Jeziel's vital sing have been stable.  Expected Outcomes Burnell will continue to participate in phase 2 cardiac rehab for exercise, nutrition and lifestyle modifications Kerrington will continue to participate in phase 2 cardiac rehab for exercise, nutrition and lifestyle modifications Rayne will continue to participate in phase 2 cardiac rehab for exercise, nutrition and lifestyle modifications            Core Components/Risk Factors/Patient Goals at Discharge (Final Review):   Goals and Risk Factor Review - 01/08/20 1639      Core Components/Risk Factors/Patient Goals Review   Personal Goals Review Weight Management/Obesity;Heart Failure;Lipids;Hypertension;Stress    Review Mccrae is doing well with exercise. Taquan's vital sing have been stable.    Expected Outcomes Carlyle will continue to participate in phase 2 cardiac rehab for exercise, nutrition and lifestyle modifications           ITP Comments:  ITP Comments    Row Name 11/28/19 1154 12/03/19 0803 12/10/19 1403 01/08/20 1638     ITP Comments Dr. Fransico Him, Medical Director Lennette Bihari started exercise on 12/03/19 and did well with exercise 30 Day ITP Review. Patient has good attendance and participation in phase 2 cardiac rehab 30 Day ITP Review. Koal has good attendance and participation in phase 2 cardiac rehab. Tejay's life vest has been discontinued           Comments: See ITP comments. Medication changes noted from recent office visit. Will continue to monitor BP.Barnet Pall, RN,BSN 01/09/2020 1:01 PM

## 2020-01-10 ENCOUNTER — Encounter (HOSPITAL_COMMUNITY)
Admission: RE | Admit: 2020-01-10 | Discharge: 2020-01-10 | Disposition: A | Discharge status: Home / Self Care | Payer: BC Managed Care – PPO | Source: Ambulatory Visit | Attending: Cardiology | Admitting: Cardiology

## 2020-01-10 ENCOUNTER — Other Ambulatory Visit: Payer: Self-pay

## 2020-01-10 ENCOUNTER — Other Ambulatory Visit: Payer: Self-pay | Admitting: *Deleted

## 2020-01-10 DIAGNOSIS — I214 Non-ST elevation (NSTEMI) myocardial infarction: Secondary | ICD-10-CM

## 2020-01-10 DIAGNOSIS — E785 Hyperlipidemia, unspecified: Secondary | ICD-10-CM

## 2020-01-10 DIAGNOSIS — Z955 Presence of coronary angioplasty implant and graft: Secondary | ICD-10-CM

## 2020-01-13 ENCOUNTER — Other Ambulatory Visit: Payer: Self-pay

## 2020-01-13 ENCOUNTER — Encounter (HOSPITAL_COMMUNITY)
Admission: RE | Admit: 2020-01-13 | Discharge: 2020-01-13 | Disposition: A | Discharge status: Home / Self Care | Payer: BC Managed Care – PPO | Source: Ambulatory Visit | Attending: Cardiology | Admitting: Cardiology

## 2020-01-13 DIAGNOSIS — I214 Non-ST elevation (NSTEMI) myocardial infarction: Secondary | ICD-10-CM

## 2020-01-13 DIAGNOSIS — Z955 Presence of coronary angioplasty implant and graft: Secondary | ICD-10-CM | POA: Diagnosis not present

## 2020-01-15 ENCOUNTER — Encounter (HOSPITAL_COMMUNITY)
Admission: RE | Admit: 2020-01-15 | Discharge: 2020-01-15 | Disposition: A | Discharge status: Home / Self Care | Payer: BC Managed Care – PPO | Source: Ambulatory Visit | Attending: Cardiology | Admitting: Cardiology

## 2020-01-15 ENCOUNTER — Other Ambulatory Visit: Payer: Self-pay

## 2020-01-15 DIAGNOSIS — Z955 Presence of coronary angioplasty implant and graft: Secondary | ICD-10-CM | POA: Diagnosis not present

## 2020-01-15 DIAGNOSIS — I214 Non-ST elevation (NSTEMI) myocardial infarction: Secondary | ICD-10-CM

## 2020-01-17 ENCOUNTER — Encounter (HOSPITAL_COMMUNITY)
Admission: RE | Admit: 2020-01-17 | Discharge: 2020-01-17 | Disposition: A | Discharge status: Home / Self Care | Payer: BC Managed Care – PPO | Source: Ambulatory Visit | Attending: Cardiology | Admitting: Cardiology

## 2020-01-17 ENCOUNTER — Other Ambulatory Visit: Payer: Self-pay

## 2020-01-17 DIAGNOSIS — I214 Non-ST elevation (NSTEMI) myocardial infarction: Secondary | ICD-10-CM | POA: Diagnosis not present

## 2020-01-17 DIAGNOSIS — Z955 Presence of coronary angioplasty implant and graft: Secondary | ICD-10-CM | POA: Diagnosis not present

## 2020-01-20 ENCOUNTER — Other Ambulatory Visit: Payer: Self-pay

## 2020-01-20 ENCOUNTER — Encounter (HOSPITAL_COMMUNITY)
Admission: RE | Admit: 2020-01-20 | Discharge: 2020-01-20 | Disposition: A | Discharge status: Home / Self Care | Payer: BC Managed Care – PPO | Source: Ambulatory Visit | Attending: Cardiology | Admitting: Cardiology

## 2020-01-20 VITALS — Ht 72.25 in | Wt 194.9 lb

## 2020-01-20 DIAGNOSIS — Z79899 Other long term (current) drug therapy: Secondary | ICD-10-CM

## 2020-01-20 DIAGNOSIS — I214 Non-ST elevation (NSTEMI) myocardial infarction: Secondary | ICD-10-CM

## 2020-01-20 DIAGNOSIS — Z955 Presence of coronary angioplasty implant and graft: Secondary | ICD-10-CM | POA: Diagnosis not present

## 2020-01-22 ENCOUNTER — Other Ambulatory Visit: Payer: Self-pay

## 2020-01-22 ENCOUNTER — Encounter (HOSPITAL_COMMUNITY)
Admission: RE | Admit: 2020-01-22 | Discharge: 2020-01-22 | Disposition: A | Discharge status: Home / Self Care | Payer: BC Managed Care – PPO | Source: Ambulatory Visit | Attending: Cardiology | Admitting: Cardiology

## 2020-01-22 DIAGNOSIS — Z955 Presence of coronary angioplasty implant and graft: Secondary | ICD-10-CM | POA: Diagnosis not present

## 2020-01-22 DIAGNOSIS — I214 Non-ST elevation (NSTEMI) myocardial infarction: Secondary | ICD-10-CM | POA: Diagnosis not present

## 2020-01-22 NOTE — Progress Notes (Signed)
Discharge Progress Report  Patient Details  Name: Edward Pierce MRN: [id number] Date of Birth: 09/07/1965 Referring Provider:     CARDIAC REHAB PHASE II ORIENTATION from 11/28/2019 in [facility]  Referring Provider Glenetta Hew MD        Number of Visits: 24  Reason for Discharge:  Patient reached a stable level of exercise. Patient independent in their exercise. Patient has met program and personal goals.  Smoking History:  Social History   Tobacco Use  Smoking Status Former Smoker   Packs/day: 0.25   Years: 13.00   Pack years: 3.25   Types: Cigarettes   Quit date: 02/12/1996   Years since quitting: 24.0  Smokeless Tobacco Never Used    Diagnosis:  NSTEMI (non-ST elevated myocardial infarction) ([place]) 10/01/19  S/P DES LAD 10/01/19  ADL [facility]SD:    Initial Exercise Prescription:  Initial Exercise Prescription - 11/28/19 1200       Date of Initial Exercise RX and Referring Provider   Date 11/28/19    Referring Provider Glenetta Hew MD    Expected Discharge Date 01/24/20      Treadmill   MPH 2.8    Grade 0    Minutes 15    METs 3.14      Bike   Level 1.5    Watts 35    Minutes 15    METs 3      Prescription Details   Frequency (times per week) 3x    Duration Progress to 10 minutes continuous walking  at current work load and total walking time to 30-45 min      Intensity   THRR 40-80% of Max Heartrate 66-133    Ratings of Perceived Exertion 11-13    Perceived Dyspnea 0-4      Progression   Progression Continue progressive overload as per policy without signs/symptoms or physical distress.      Resistance Training   Training Prescription Yes    Weight 4lbs    Reps 10-15             Discharge Exercise Prescription (Final Exercise Prescription Changes):  Exercise Prescription Changes - 01/24/20 1400       Response to Exercise   Blood Pressure (Admit) 92/50    Blood Pressure (Exercise) 108/70    Blood  Pressure (Exit) 92/68    Heart Rate (Admit) 72 bpm    Heart Rate (Exercise) 96 bpm    Heart Rate (Exit) 72 bpm    Rating of Perceived Exertion (Exercise) 13    Perceived Dyspnea (Exercise) 0    Symptoms None    Comments Pt graduated from the [facility] program today.    Duration Continue with 30 min of aerobic exercise without signs/symptoms of physical distress.    Intensity THRR unchanged      Progression   Progression Continue to progress workloads to maintain intensity without signs/symptoms of physical distress.    Average METs 4.1      Resistance Training   Training Prescription Yes    Weight 7 lbs    Reps 10-15    Time 10 Minutes      Interval Training   Interval Training No      Treadmill   MPH 3    Grade 2    Minutes 15    METs 4.12      Bike   Level 3.2    Watts 55    Minutes 15    METs 4  Home Exercise Plan   Plans to continue exercise at Home (comment)    Frequency Add 3 additional days to program exercise sessions.    Initial Home Exercises Provided 12/20/19             Functional Capacity:  6 Minute Walk     Row Name 11/28/19 1206 01/17/20 0910       6 Minute Walk   Phase Initial Initial    Distance 1800 feet 2000 feet    Distance % Change -- 11.1 %    Distance Feet Change -- 200 ft    Walk Time 6 minutes 6 minutes    # of Rest Breaks 0 0    MPH 3.4 3.8    METS 4.7 5.1    RPE 12 12    Perceived Dyspnea  0 0    VO2 Peak 16.6 17.7    Symptoms No No    Resting HR 67 bpm 68 bpm    Resting BP 102/60 102/64    Resting Oxygen Saturation  98 % 99 %    Exercise Oxygen Saturation  during 6 min walk 99 % 99 %    Max Ex. HR 99 bpm 94 bpm    Max Ex. BP 130/80 124/70    2 Minute Post BP 122/80 --             Psychological, QOL, Others - Outcomes: PHQ 2/9: Depression screen [facility] 2/9 01/22/2020 11/28/2019 08/29/2017 02/28/2017 02/28/2017  Decreased Interest 0 0 0 0 0  Down, Depressed, Hopeless 0 0 0 0 0  PHQ - 2 Score 0 0 0 0 0  Altered  sleeping - - 0 0 -  Tired, decreased energy - - 0 0 -  Change in appetite - - 0 0 -  Feeling bad or failure about yourself  - - 0 0 -  Trouble concentrating - - 0 0 -  Moving slowly or fidgety/restless - - 0 0 -  Suicidal thoughts - - 0 0 -  PHQ-9 Score - - 0 0 -  Difficult doing work/chores - - Not difficult at all Not difficult at all -    Quality of Life:  Quality of Life - 01/17/20 1025       Quality of Life   Select Quality of Life      Quality of Life Scores   Health/Function Post 23.77 %    Socioeconomic Post 21.06 %    Psych/Spiritual Post 23.57 %    Family Post 26.4 %    GLOBAL Pre 23.49 %             Personal Goals: Goals established at orientation with interventions provided to work toward goal.  Personal Goals and Risk Factors at Admission - 11/28/19 1351       Core Components/Risk Factors/Patient Goals on Admission    Weight Management Yes;Weight Maintenance    Intervention Weight Management: Develop a combined nutrition and exercise program designed to reach desired caloric intake, while maintaining appropriate intake of nutrient and fiber, sodium and fats, and appropriate energy expenditure required for the weight goal.;Weight Management: Provide education and appropriate resources to help participant work on and attain dietary goals.    Expected Outcomes Weight Maintenance: Understanding of the daily nutrition guidelines, which includes 25-35% calories from fat, 7% or less cal from saturated fats, less than 215m cholesterol, less than 1.5gm of sodium, & 5 or more servings of fruits and vegetables daily;Weight Loss: Understanding of general recommendations  for a balanced deficit meal plan, which promotes 1-2 lb weight loss per week and includes a negative energy balance of [phone] kcal/d;Understanding recommendations for meals to include 15-35% energy as protein, 25-35% energy from fat, 35-60% energy from carbohydrates, less than 261m of dietary cholesterol,  20-35 gm of total fiber daily;Short Term: Continue to assess and modify interventions until short term weight is achieved    Heart Failure Yes    Intervention Provide a combined exercise and nutrition program that is supplemented with education, support and counseling about heart failure. Directed toward relieving symptoms such as shortness of breath, decreased exercise tolerance, and extremity edema.    Expected Outcomes Improve functional capacity of life;Short term: Attendance in program 2-3 days a week with increased exercise capacity. Reported lower sodium intake. Reported increased fruit and vegetable intake. Reports medication compliance.;Short term: Daily weights obtained and reported for increase. Utilizing diuretic protocols set by physician.;Long term: Adoption of self-care skills and reduction of barriers for early signs and symptoms recognition and intervention leading to self-care maintenance.    Hypertension Yes    Intervention Provide education on lifestyle modifcations including regular physical activity/exercise, weight management, moderate sodium restriction and increased consumption of fresh fruit, vegetables, and low fat dairy, alcohol moderation, and smoking cessation.;Monitor prescription use compliance.    Expected Outcomes Short Term: Continued assessment and intervention until BP is < 140/955mHG in hypertensive participants. < 130/8016mG in hypertensive participants with diabetes, heart failure or chronic kidney disease.;Long Term: Maintenance of blood pressure at goal levels.    Lipids Yes    Intervention Provide education and support for participant on nutrition & aerobic/resistive exercise along with prescribed medications to achieve LDL <75m89mDL >40mg53m Expected Outcomes Short Term: Participant states understanding of desired cholesterol values and is compliant with medications prescribed. Participant is following exercise prescription and nutrition guidelines.;Long Term:  Cholesterol controlled with medications as prescribed, with individualized exercise RX and with personalized nutrition plan. Value goals: LDL < 75mg,19m > 40 mg.    Stress Yes    Intervention Offer individual and/or small group education and counseling on adjustment to heart disease, stress management and health-related lifestyle change. Teach and support self-help strategies.;Refer participants experiencing significant psychosocial distress to appropriate mental health specialists for further evaluation and treatment. When possible, include family members and significant others in education/counseling sessions.    Expected Outcomes Short Term: Participant demonstrates changes in health-related behavior, relaxation and other stress management skills, ability to obtain effective social support, and compliance with psychotropic medications if prescribed.;Long Term: Emotional wellbeing is indicated by absence of clinically significant psychosocial distress or social isolation.              Personal Goals Discharge:  Goals and Risk Factor Review     Row Name 12/03/19 0805 12/10/19 1412 01/08/20 1639 01/22/20 1350       Core Components/Risk Factors/Patient Goals Review   Personal Goals Review Weight Management/Obesity;Heart Failure;Lipids;Hypertension;Stress Weight Management/Obesity;Heart Failure;Lipids;Hypertension;Stress Weight Management/Obesity;Heart Failure;Lipids;Hypertension;Stress Weight Management/Obesity;Heart Failure;Lipids;Hypertension;Stress    Review Damere Herseyed exercise on 12/03/19. Carlo Dustinell with exercise. Demareon Jaimesoning well with exercise. Tregan's vital sing have been stable. Latavion Nassiring well with exercise. Haruo's vital sing have been stable. Treylin Rauning well with exercise. Shantel's vital sing have been stable. Chey's life vest has been discontinued. Dantre Kairoetes cardiac rehab on 01/24/20.    Expected Outcomes Joakim Torstencontinue to participate in phase 2 cardiac  rehab for exercise, nutrition and lifestyle  modifications Yisroel will continue to participate in phase 2 cardiac rehab for exercise, nutrition and lifestyle modifications Sebastian will continue to participate in phase 2 cardiac rehab for exercise, nutrition and lifestyle modifications Maicol will continue to exercise upon completion of phase 2 cardiac rehab follow lifestyle and nutrtional modifications             Exercise Goals and Review:  Exercise Goals     Row Name 11/28/19 1207             Exercise Goals   Increase Physical Activity Yes       Intervention Provide advice, education, support and counseling about physical activity/exercise needs.;Develop an individualized exercise prescription for aerobic and resistive training based on initial evaluation findings, risk stratification, comorbidities and participant's personal goals.       Expected Outcomes Short Term: Attend rehab on a regular basis to increase amount of physical activity.;Long Term: Add in home exercise to make exercise part of routine and to increase amount of physical activity.;Long Term: Exercising regularly at least 3-5 days a week.       Increase Strength and Stamina Yes       Intervention Provide advice, education, support and counseling about physical activity/exercise needs.;Develop an individualized exercise prescription for aerobic and resistive training based on initial evaluation findings, risk stratification, comorbidities and participant's personal goals.       Expected Outcomes Short Term: Increase workloads from initial exercise prescription for resistance, speed, and METs.;Short Term: Perform resistance training exercises routinely during rehab and add in resistance training at home;Long Term: Improve cardiorespiratory fitness, muscular endurance and strength as measured by increased METs and functional capacity (6MWT)       Able to understand and use rate of perceived exertion (RPE) scale Yes        Intervention Provide education and explanation on how to use RPE scale       Expected Outcomes Short Term: Able to use RPE daily in rehab to express subjective intensity level;Long Term:  Able to use RPE to guide intensity level when exercising independently       Knowledge and understanding of Target Heart Rate Range (THRR) Yes       Intervention Provide education and explanation of THRR including how the numbers were predicted and where they are located for reference       Expected Outcomes Short Term: Able to state/look up THRR;Long Term: Able to use THRR to govern intensity when exercising independently;Short Term: Able to use daily as guideline for intensity in rehab       Able to check pulse independently Yes       Intervention Provide education and demonstration on how to check pulse in carotid and radial arteries.;Review the importance of being able to check your own pulse for safety during independent exercise       Expected Outcomes Short Term: Able to explain why pulse checking is important during independent exercise;Long Term: Able to check pulse independently and accurately       Understanding of Exercise Prescription Yes       Intervention Provide education, explanation, and written materials on patient's individual exercise prescription       Expected Outcomes Short Term: Able to explain program exercise prescription;Long Term: Able to explain home exercise prescription to exercise independently                Exercise Goals Re-Evaluation:  Exercise Goals Re-Evaluation     Row Name 12/02/19 1005 12/20/19 0388  01/09/20 1104 01/24/20 1438       Exercise Goal Re-Evaluation   Exercise Goals Review Increase Physical Activity;Knowledge and understanding of Target Heart Rate Range (THRR);Able to understand and use rate of perceived exertion (RPE) scale Increase Physical Activity;Knowledge and understanding of Target Heart Rate Range (THRR);Able to understand and use rate of  perceived exertion (RPE) scale;Understanding of Exercise Prescription;Increase Strength and Stamina Increase Physical Activity;Knowledge and understanding of Target Heart Rate Range (THRR);Able to understand and use rate of perceived exertion (RPE) scale;Understanding of Exercise Prescription;Increase Strength and Stamina Increase Physical Activity;Increase Strength and Stamina;Able to understand and use rate of perceived exertion (RPE) scale;Knowledge and understanding of Target Heart Rate Range (THRR);Able to check pulse independently;Understanding of Exercise Prescription    Comments Pt's first day of exercise. Pt oriented to equipment. Pt responded well with exercise, was able to exercise for 30 minutes with no difficulty. Reviewed home exericse guidelines with patient. Patient is walking 20 minutes BID, 3 days/week as his mode of home exercise. Spoke with pt regarding exercising at home and continuing to exercise after cardiac rehab. Pt will start to make a plan for exercise once he is done with the program and working more. Also spoke with pt regarding progressing workloads and pt is responding well to increases. Pt graduated from the [facility] program today. Pt progressed well and demonstrated excellent attendance. He had an average MET level of 4.1. Pt will continue to exercise at home by walking, using treadmill, bike, and basketball.    Expected Outcomes Pt will continue to increase strength and stamina. Will follow up with home exercise plan. Patient will continue walking 3 days/week in addition to exercise at cardiac rehab to help increase strength and stamina. Pt will walk 3 days a week for 30-45 minutes. Pt will continue exercising on his own at home.             Nutrition & Weight - Outcomes:  Pre Biometrics - 11/28/19 1208       Pre Biometrics   Height 6' 0.25" (1.835 m)    Weight 201 lb 11.5 oz (91.5 kg)    Waist Circumference 40 inches    Hip Circumference 39.5 inches    Waist to Hip  Ratio 1.01 %    BMI (Calculated) 27.17    Triceps Skinfold 12 mm    % Body Fat 25.6 %    Grip Strength 60 kg    Flexibility 15 in    Single Leg Stand 25.62 seconds             Post Biometrics - 01/20/20 1000        Post  Biometrics   Height 6' 0.25" (1.835 m)    Weight 194 lb 14.2 oz (88.4 kg)    Waist Circumference 39.5 inches    Hip Circumference 40 inches    Waist to Hip Ratio 0.99 %    BMI (Calculated) 26.25    Triceps Skinfold 11 mm    % Body Fat 24.5 %    Grip Strength 60 kg    Flexibility 14 in    Single Leg Stand 30 seconds             Nutrition:  Nutrition Therapy & Goals - 12/06/19 1328       Nutrition Therapy   Diet Heart healthy/low sodium      Personal Nutrition Goals   Nutrition Goal Pt to build a healthy plate including vegetables, fruits, whole grains, and low-fat dairy products in  a heart healthy meal plan.    Personal Goal #2 Pt to eat a variety of non-starchy vegetables.    Personal Goal #3 Pt to cut down on meats high in saturated fat.    Personal Goal #4 Pt to choose fish more often      Intervention Plan   Intervention Prescribe, educate and counsel regarding individualized specific dietary modifications aiming towards targeted core components such as weight, hypertension, lipid management, diabetes, heart failure and other comorbidities.    Expected Outcomes Short Term Goal: Understand basic principles of dietary content, such as calories, fat, sodium, cholesterol and nutrients.             Nutrition Discharge:  Nutrition Assessments - 12/03/19 1008       MEDFICTS Scores   Pre Score 0             Education Questionnaire Score:  Knowledge Questionnaire Score - 01/17/20 1026       Knowledge Questionnaire Score   Post Score 22/24             Goals reviewed with patient; copy given to patient.Adnan graduated from cardiac rehab program today with completion of 24 exercise sessions in Phase II. Pt maintained good  attendance and progressed nicely during his participation in rehab as evidenced by increased MET level.   Medication list reconciled. Repeat  PHQ score- 0 .  Pt has made significant lifestyle changes and should be commended for his success. Pt feels he has achieved his goals during cardiac rehab.   Pt plans to continue exercise by walking using the treadmill and stationary bike. Mays increased his distance by 200 feet. We are proud of Kylan's progress! Barnet Pall, RN,BSN 02/13/2020 9:06 AM

## 2020-01-24 ENCOUNTER — Encounter (HOSPITAL_COMMUNITY)
Admission: RE | Admit: 2020-01-24 | Discharge: 2020-01-24 | Disposition: A | Discharge status: Home / Self Care | Payer: BC Managed Care – PPO | Source: Ambulatory Visit | Attending: Cardiology | Admitting: Cardiology

## 2020-01-24 ENCOUNTER — Other Ambulatory Visit: Payer: Self-pay

## 2020-01-24 DIAGNOSIS — Z955 Presence of coronary angioplasty implant and graft: Secondary | ICD-10-CM

## 2020-01-24 DIAGNOSIS — I214 Non-ST elevation (NSTEMI) myocardial infarction: Secondary | ICD-10-CM | POA: Diagnosis not present

## 2020-01-28 DIAGNOSIS — Z79899 Other long term (current) drug therapy: Secondary | ICD-10-CM | POA: Diagnosis not present

## 2020-01-29 LAB — BASIC METABOLIC PANEL
BUN/Creatinine Ratio: 10 (ref 9–20)
BUN: 9 mg/dL (ref 6–24)
CO2: 22 mmol/L (ref 20–29)
Calcium: 9.3 mg/dL (ref 8.7–10.2)
Chloride: 104 mmol/L (ref 96–106)
Creatinine, Ser: 0.89 mg/dL (ref 0.76–1.27)
GFR calc Af Amer: 112 mL/min/{1.73_m2} (ref >59)
GFR calc non Af Amer: 97 mL/min/{1.73_m2} (ref >59)
Glucose: 101 mg/dL — ABNORMAL HIGH (ref 65–99)
Potassium: 4.2 mmol/L (ref 3.5–5.2)
Sodium: 141 mmol/L (ref 134–144)

## 2020-02-04 ENCOUNTER — Other Ambulatory Visit: Payer: Self-pay

## 2020-02-04 ENCOUNTER — Ambulatory Visit (INDEPENDENT_AMBULATORY_CARE_PROVIDER_SITE_OTHER): Payer: BC Managed Care – PPO | Admitting: Pharmacist Clinician (PhC)/ Clinical Pharmacy Specialist

## 2020-02-04 ENCOUNTER — Ambulatory Visit: Payer: BC Managed Care – PPO | Admitting: General Practice

## 2020-02-04 DIAGNOSIS — I5041 Acute combined systolic (congestive) and diastolic (congestive) heart failure: Secondary | ICD-10-CM

## 2020-02-04 DIAGNOSIS — E785 Hyperlipidemia, unspecified: Secondary | ICD-10-CM | POA: Diagnosis not present

## 2020-02-04 MED ORDER — EMPAGLIFLOZIN 10 MG PO TABS
10.0000 mg | ORAL_TABLET | Freq: Every day | ORAL | 3 refills | Status: DC
Start: 2020-02-04 — End: 2020-11-27

## 2020-02-04 NOTE — Patient Instructions (Addendum)
   Check your blood pressure at home occasionally and keep record of the readings.  Take your meds as follows:  Start Jardiance 10 mg once daily.  Not sure if your insurance will cover [place] or [place]ade, so we gave you copay cards for both.    After 2 weeks you can start the Repatha/Praluent injections.  These are once every 14 days.  We will need to repeat cholesterol labs after 5-6 doses.  We will send you a lab order in mid-January for this.  Bring all of your meds, your BP cuff and your record of home blood pressures to your next appointment.  Exercise as you're able, try to walk approximately 30 minutes per day.  Keep salt intake to a minimum, especially watch canned and prepared boxed foods.  Eat more fresh fruits and vegetables and fewer canned items.  Avoid eating in fast food restaurants.    HOW TO TAKE YOUR BLOOD PRESSURE: . Rest 5 minutes before taking your blood pressure. .  Don't smoke or drink caffeinated beverages for at least 30 minutes before. . Take your blood pressure before (not after) you eat. . Sit comfortably with your back supported and both feet on the floor (don't cross your legs). . Elevate your arm to heart level on a table or a desk. . Use the proper sized cuff. It should fit smoothly and snugly around your bare upper arm. There should be enough room to slip a fingertip under the cuff. The bottom edge of the cuff should be 1 inch above the crease of the elbow. . Ideally, take 3 measurements at one sitting and record the average.

## 2020-02-04 NOTE — Assessment & Plan Note (Signed)
>>  ASSESSMENT AND PLAN FOR HYPERLIPIDEMIA WITH TARGET LDL LESS THAN 70 WRITTEN ON 02/04/2020 11:49 AM BY ALVSTAD, KRISTIN L, RPH-CPP  Patient with familial hyperlipidemia, currently on atorvastatin 80 mg daily.  LDL has dropped significantly with this, from 241 to 87, but not quite to goal of < 70.  Reviewed options for lowering LDL cholesterol, including ezetimibe, PCSK-9 inhibitors and bempedoic acid.  Discussed mechanisms of action, dosing, side effects and potential decreases in LDL cholesterol.  Answered all patient questions.  Based on this information, patient would prefer to start PCSK-9 inhibitor.  I believe Praluent 150 mg is the covered medication with his insurance.  Once approved, we will repeat labs after 5-6 doses.

## 2020-02-04 NOTE — Assessment & Plan Note (Addendum)
Patient with HFmrEF, currently at 40-45%.  He is on lowest doses of metoprolol succ and Entresto, and his blood pressure will not tolerate increases in either, or the addition of spironolactone.  Will add Jardiance 10 mg daily and explained purpose of medication, as patient not diabetic.  Answered all questions.  He knows to call office with any questions or concerns.

## 2020-02-04 NOTE — Progress Notes (Signed)
02/04/2020 Edward Pierce Mar 11, 1966 [id number]   HPI:  Edward Pierce is a 54 y.o. male patient of Dr Ellyn Hack, with a PMH below who presents today for heart failure medication titration and lipid evaluation.  He was seen last month by Roby Lofts PA  Past Medical History: CAD S/p NSTEMI w/ DES to LAD  CHF Chronic combined; EF at 40-45% (10/21), up from 35% in July  hypertension Controlled with Entresto, metoprolol  hyperlipidemia Baseline LDL 241 (7/21) with TC 323        Blood Pressure Goal:  130/80  Current Medications: Entresto 24/26 mg bid, metoprolol succ 12.5 mg qd  Atorvastatin 80 mg qd  Family Hx: father died from melanoma at 8, had elevated cholesterol, mom on statin, AVR/MR repair; brother has appt with cardiology, not sure; kids 23,19   Social Hx:  No tobacco, quit 23 years ago, occasional wine - up to 1/day; 1 black coffee/day  Diet:  Mostly home cooked, no deep fried (air fry, grill, bake), mix of healthy choices;   Exercise: cardiac rehab  Home BP readings: no home readings with him, but notes when check at cardiac rehab was mostly in the 222-979 systolic range.    Intolerances: knda  Labs: 01/28/20:  Na 141, K 4.2, Glu 101, BUN 9, SCr 0.89 GFR 97  10/21: TC 152, TG 146, HDL 39, LDL 87  Wt Readings from Last 3 Encounters:  02/04/20 204 lb (92.5 kg)  01/20/20 194 lb 14.2 oz (88.4 kg)  01/07/20 198 lb 3.2 oz (89.9 kg)   BP Readings from Last 3 Encounters:  02/04/20 (!) 102/58  01/07/20 (!) 114/58  11/28/19 102/60   Pulse Readings from Last 3 Encounters:  02/04/20 68  01/07/20 (!) 55  11/28/19 67    Current Outpatient Medications  Medication Sig Dispense Refill  . aspirin EC 81 MG EC tablet Take 1 tablet (81 mg total) by mouth daily. Swallow whole. 30 tablet 11  . atorvastatin (LIPITOR) 80 MG tablet TAKE 1 TABLET (80 MG TOTAL) BY MOUTH AT BEDTIME 30 tablet 2  . metoprolol succinate (TOPROL XL) 25 MG 24 hr tablet Take 0.5 tablets (12.5 mg  total) by mouth daily. 45 tablet 3  . nitroGLYCERIN (NITROSTAT) 0.4 MG SL tablet Place 1 tablet (0.4 mg total) under the tongue every 5 (five) minutes as needed for chest pain (CP or SOB). 25 tablet 2  . sacubitril-valsartan (ENTRESTO) 24-26 MG Take 1 tablet by mouth 2 (two) times daily. 60 tablet 11  . empagliflozin (JARDIANCE) 10 MG TABS tablet Take 1 tablet (10 mg total) by mouth daily before breakfast. 90 tablet 3  . ticagrelor (BRILINTA) 90 MG TABS tablet Take 1 tablet (90 mg total) by mouth 2 (two) times daily. 60 tablet 11   No current facility-administered medications for this visit.    No Known Allergies  Past Medical History:  Diagnosis Date  . Coronary artery disease involving native coronary artery of native heart with angina pectoris ([place]) 10/03/2019   Cardiac Cath-PCI 10/02/2019:Culprit lesion mid LAD 99% heavily thrombotic lesion-->aspiration thrombectomy, DES PCI Synergy 3.0 x 23. 3.3 mm). Otherwise essentially normal coronaries with RI, LCx and RCA free of significant disease-RCA has mild proximal 20%.. Relatively normal LVEDP 7 mmHg.  Marland Kitchen History of acute anterior wall MI (did not meet full STEMI criteria) 10/02/2019   associated with AIVR & Sustained VT - ? reperfusion / ischemic arrhythmias  . Hyperlipidemia with target LDL less than 70 01/19/2016  .  Hypertriglyceridemia, familial 01/19/2016  . Ischemic cardiomyopathy 10/03/2019   TTE 10/02/2019: (Post anterior MI) EF roughly 35%. Mid-apical anteroseptal and inferoseptal akinesis. Apical inferior apical anterior and apical akinesis. GR 1 DD.   Marland Kitchen Sustained ventricular tachycardia ([place]) 10/03/2019   Admitted for ACS-troponin elevation, EKG with borderline anterior STEMI findings.  Had AIVR and sustained VT during stabilization overnight.    Blood pressure (!) 102/58, pulse 68, resp. rate 15, height 6' (1.829 m), weight 204 lb (92.5 kg), SpO2 98 %.  Acute combined systolic and diastolic heart failure ([place]) Patient with  HFmrEF, currently at 40-45%.  He is on lowest doses of metoprolol succ and Entresto, and his blood pressure will not tolerate increases in either, or the addition of spironolactone.  Will add Jardiance 10 mg daily and explained purpose of medication, as patient not diabetic.  Answered all questions.  He knows to call office with any questions or concerns.    Hyperlipidemia with target LDL less than 70 Patient with familial hyperlipidemia, currently on atorvastatin 80 mg daily.  LDL has dropped significantly with this, from 241 to 87, but not quite to goal of < 70.  Reviewed options for lowering LDL cholesterol, including ezetimibe, PCSK-9 inhibitors and bempedoic acid.  Discussed mechanisms of action, dosing, side effects and potential decreases in LDL cholesterol.  Answered all patient questions.  Based on this information, patient would prefer to start PCSK-9 inhibitor.  I believe Praluent 150 mg is the covered medication with his insurance.  Once approved, we will repeat labs after 5-6 doses.    Tommy Medal PharmD CPP [facility] [address] [place], [place] [address] [phone]

## 2020-02-04 NOTE — Assessment & Plan Note (Addendum)
Patient with familial hyperlipidemia, currently on atorvastatin 80 mg daily.  LDL has dropped significantly with this, from 241 to 87, but not quite to goal of < 70.  Reviewed options for lowering LDL cholesterol, including ezetimibe, PCSK-9 inhibitors and bempedoic acid.  Discussed mechanisms of action, dosing, side effects and potential decreases in LDL cholesterol.  Answered all patient questions.  Based on this information, patient would prefer to start PCSK-9 inhibitor.  I believe Praluent 150 mg is the covered medication with his insurance.  Once approved, we will repeat labs after 5-6 doses.

## 2020-02-10 ENCOUNTER — Telehealth: Payer: Self-pay

## 2020-02-10 DIAGNOSIS — E785 Hyperlipidemia, unspecified: Secondary | ICD-10-CM

## 2020-02-10 MED ORDER — PRALUENT 150 MG/ML ~~LOC~~ SOAJ: 150.0000 mg | SUBCUTANEOUS | 11 refills | Status: DC

## 2020-02-10 NOTE — Telephone Encounter (Signed)
Spoke w/pts wife regarding the approval of the praluent 150. Advised her he's eligible for copay card. rx sent, lipids and lfts ordered and they were advised no appt needed but does have to be fasting after their 4th injection. Pt's wife voiced understanding. Also instructed to apply for a copay card and to call if they have issues

## 2020-03-15 ENCOUNTER — Other Ambulatory Visit: Payer: Self-pay | Admitting: Cardiology

## 2020-04-03 ENCOUNTER — Other Ambulatory Visit: Payer: Self-pay | Admitting: Pharmacist Clinician (PhC)/ Clinical Pharmacy Specialist

## 2020-04-03 DIAGNOSIS — E785 Hyperlipidemia, unspecified: Secondary | ICD-10-CM

## 2020-04-08 ENCOUNTER — Other Ambulatory Visit: Payer: Self-pay | Admitting: Cardiology

## 2020-04-08 DIAGNOSIS — E785 Hyperlipidemia, unspecified: Secondary | ICD-10-CM | POA: Diagnosis not present

## 2020-04-09 ENCOUNTER — Ambulatory Visit (INDEPENDENT_AMBULATORY_CARE_PROVIDER_SITE_OTHER): Payer: BC Managed Care – PPO | Admitting: Cardiology

## 2020-04-09 ENCOUNTER — Encounter: Payer: Self-pay | Admitting: Cardiology

## 2020-04-09 ENCOUNTER — Other Ambulatory Visit: Payer: Self-pay

## 2020-04-09 VITALS — BP 130/75 | HR 53 | Ht 72.0 in | Wt 197.4 lb

## 2020-04-09 DIAGNOSIS — E785 Hyperlipidemia, unspecified: Secondary | ICD-10-CM | POA: Diagnosis not present

## 2020-04-09 DIAGNOSIS — I25119 Atherosclerotic heart disease of native coronary artery with unspecified angina pectoris: Secondary | ICD-10-CM

## 2020-04-09 DIAGNOSIS — E781 Pure hyperglyceridemia: Secondary | ICD-10-CM

## 2020-04-09 DIAGNOSIS — I5020 Unspecified systolic (congestive) heart failure: Secondary | ICD-10-CM

## 2020-04-09 DIAGNOSIS — I255 Ischemic cardiomyopathy: Secondary | ICD-10-CM | POA: Diagnosis not present

## 2020-04-09 DIAGNOSIS — Z955 Presence of coronary angioplasty implant and graft: Secondary | ICD-10-CM | POA: Diagnosis not present

## 2020-04-09 DIAGNOSIS — I498 Other specified cardiac arrhythmias: Secondary | ICD-10-CM

## 2020-04-09 LAB — LIPID PANEL
Chol/HDL Ratio: 1.8 ratio (ref 0.0–5.0)
Cholesterol, Total: 85 mg/dL — ABNORMAL LOW (ref 100–199)
HDL: 46 mg/dL (ref >39)
LDL Chol Calc (NIH): 22 mg/dL (ref 0–99)
Triglycerides: 84 mg/dL (ref 0–149)
VLDL Cholesterol Cal: 17 mg/dL (ref 5–40)

## 2020-04-09 LAB — HEPATIC FUNCTION PANEL
ALT: 27 IU/L (ref 0–44)
AST: 34 IU/L (ref 0–40)
Albumin: 4.8 g/dL (ref 3.8–4.9)
Alkaline Phosphatase: 94 IU/L (ref 44–121)
Bilirubin Total: 0.5 mg/dL (ref 0.0–1.2)
Bilirubin, Direct: 0.17 mg/dL (ref 0.00–0.40)
Total Protein: 6.8 g/dL (ref 6.0–8.5)

## 2020-04-09 MED ORDER — ENTRESTO 24-26 MG PO TABS: ORAL_TABLET | ORAL | 11 refills | Status: DC

## 2020-04-09 MED ORDER — ATORVASTATIN CALCIUM 40 MG PO TABS
40.0000 mg | ORAL_TABLET | Freq: Every day | ORAL | 3 refills | Status: DC
Start: 2020-04-09 — End: 2020-11-18

## 2020-04-09 NOTE — Progress Notes (Signed)
Primary Care Provider: Lorrene Reid, PA-C Cardiologist: Glenetta Hew, MD Electrophysiologist: None  Clinic Note: Chief Complaint  Patient presents with  . Follow-up    23-month, doing well.  . Coronary Artery Disease    No angina  . Cardiomyopathy    NYHA class I symptoms.   ===================================  ASSESSMENT/PLAN   Problem List Items Addressed This Visit    Coronary artery disease involving native coronary artery of native heart with angina pectoris ([place]) - Primary (Chronic)    Single-vessel CAD with LAD PCI.  No further angina.  He is doing quite well since his heart attack.   No further angina.  No heart failure symptoms.  Plan: Continue aspirin and Brilinta for now.  No bleeding issues.  I suspect we can probably reduce his atorvastatin to 40 mg since he is now also on Praluent.  He is on stable dose of Toprol which I am leery of adjusting further because of bradycardia 50 bpm.  We will start Entresto which I think can titrate further.          Relevant Medications   atorvastatin (LIPITOR) 40 MG tablet   sacubitril-valsartan (ENTRESTO) 24-26 MG   Other Relevant Orders   EKG 12-Lead (Completed)   Lipid panel   Comprehensive metabolic panel   Presence of drug coated stent in LAD coronary artery (Chronic)    Plan discontinue aspirin and Brilinta at 90 mg twice daily through July 2022.  At that point, the plan will be to reduce to maintenance dose Brilinta for least another year at 60 mg twice daily and stop aspirin.  Beyond that, we reviewed the use clopidogrel or continue maintenance dose Brilinta. As of August 2022, okay to temporarily hold Thienopyridine (Brilinta or Plavix) for procedures.      Relevant Orders   EKG 12-Lead (Completed)   Lipid panel   Comprehensive metabolic panel   Ischemic cardiomyopathy (Chronic)    Notable improvement of his EF up to now 40 to 45%.  I suspect that it probably may be closer to 80.  There is a  significant anterior hypokinesis from his the anterior wall MI.  He was converted to carvedilol to Toprol because of bradycardia and borderline pressures.  This allowed [place] to start Entresto. He was also started on Jardiance as an SGLT2 inhibitor.  Plan:  We will try to titrate Entresto up to the second dose.  We will start with heparin and double his evening dose if tolerated, titrate up to 2 tabs twice daily.  We can then change to dose for his prescription.  Continue Jardiance along with low-dose Toprol  Not requiring diuretic      Relevant Medications   atorvastatin (LIPITOR) 40 MG tablet   sacubitril-valsartan (ENTRESTO) 24-26 MG   Other Relevant Orders   EKG 12-Lead (Completed)   Lipid panel   Comprehensive metabolic panel   HFrEF (heart failure with reduced ejection fraction) (H[facility]) (Chronic)   Relevant Medications   atorvastatin (LIPITOR) 40 MG tablet   sacubitril-valsartan (ENTRESTO) 24-26 MG   Other Relevant Orders   EKG 12-Lead (Completed)   Lipid panel   Comprehensive metabolic panel   Hyperlipidemia with target LDL less than 70 (Chronic)    Was not quite at goal on atorvastatin.  Started on Praluent and doing very well. Lipids look outstanding.    At this point I think we may really reduce his atorvastatin to 40 mg.  Would like to avoid side effects since he is having a  little bit of of fatigue here and there.      Relevant Medications   atorvastatin (LIPITOR) 40 MG tablet   sacubitril-valsartan (ENTRESTO) 24-26 MG   Other Relevant Orders   Lipid panel   Comprehensive metabolic panel   Hypertriglyceridemia, familial (Chronic)    Most recent triglycerides look great with Praluent added to [facility].      Relevant Medications   atorvastatin (LIPITOR) 40 MG tablet   sacubitril-valsartan (ENTRESTO) 24-26 MG   Other Relevant Orders   Lipid panel   Comprehensive metabolic panel   Periodic heart flutter    Continue to monitor his fluttering sensations.  If they  continue to persist, would probably order a cardiac event monitor, otherwise we will simply continue current dose of beta-blocker.  I think prolonged..       Relevant Orders   EKG 12-Lead (Completed)   Lipid panel   Comprehensive metabolic panel     ===================================  HPI:    Edward Pierce is a 55 y.o. male with a PMH of CAD-ICM after anterior STEMI in July 2021 with HTN and HLD below who presents today for 87-month follow-up.   7/20-24/2021 (Anterior NSTEMI / Sustained VT -> Thrombotic Subtotal LAD -> thrombectomy (large red clot) - DES PCI, Severely reduced LVEF --> d/c with LifeVest; BP did not tolerate more than low dose Toprol & 25 mg Losartan. (no Entresto).  I have not seen Edward Pierce since August 9  for his initial post hospital follow-up.  He was doing well at that time.  Was walking up to 20 minutes at a time 2 times a day.  No chest pain or pressure.  Has lost 12 pounds since follow-up.  Energy level is improving.  Was having a few episodes of fast heart rate spells. -> Started in cardiac rehab.  He was then seen by Edward Lofts, PA on 01/07/2020 following echocardiogram showing improved EF up to 40 to 45%.  Still doing cardiac rehab no chest pain dyspnea, edema, PND, or orthopnea. ->  Converted from carvedilol to Toprol and losartan to Dennie Fetters was last seen on February 04, 2020 by Edward Pierce, RPH- CCP in our [facility] ([facility]) for titration of CHF medications management lipids.  Was started on Praluent along with atorvastatin.  Tolerating Entresto well.    Started Praluent 150 mg along with Jardiance .  Recent Hospitalizations: None  Reviewed  CV studies:    The following studies were reviewed today: (if available, images/films reviewed: From Epic Chart or Care Everywhere) . ECHO 01/01/20: EF 40 to 45%.  Mildly reduced LV function with apical akinesis along with anterolateral akinesis.  Normal RV function.   Normal RAP.  Mild aortic dilation -39 mm  Interval History:   Edward Pierce presents today for follow-up.  He has multiple questions.  He graduated from cardiac rehab and was doing quite well.  He now is try to continue that activity of walking at least 30 minutes/day.  He also tries to do more than that on other days.  He denies any chest pain or pressure with rest or exertion.  He seems to be a little bit more tired in the mornings.  He takes a little bit longer to get going.  He has had a couple episodes where he feels more blue short lasting flutter but nothing more than a minute or 2.  He has not had any heart failure symptoms, and says his energy level is overall better.  CV Review of Symptoms (Summary): no chest pain or dyspnea on exertion positive for - Occasional palpitations and sometimes has a hard time getting going negative for - edema, orthopnea, paroxysmal nocturnal dyspnea, rapid heart rate, shortness of breath or Lightheadedness or dizziness, syncope/near syncope or TIA/amaurosis fugax, claudication  The patient does not have symptoms concerning for COVID-19 infection (fever, chills, cough, or new shortness of breath).   REVIEWED OF SYSTEMS   Review of Systems  Constitutional: Negative for malaise/fatigue (Takes a little while to get things going.) and weight loss (He has lost back weight that he had previously gained back).  HENT: Negative for congestion and nosebleeds.   Respiratory: Negative for cough and shortness of breath.   Cardiovascular: Negative for leg swelling.  Gastrointestinal: Negative for blood in stool and melena.  Genitourinary: Negative for hematuria.  Musculoskeletal: Negative for back pain and joint pain.  Neurological: Negative for dizziness, focal weakness, weakness and headaches.  Psychiatric/Behavioral: Negative for depression and memory loss. The patient is nervous/anxious. The patient does not have insomnia.    I have reviewed and (if needed)  personally updated the patient's problem list, medications, allergies, past medical and surgical history, social and family history.   PAST MEDICAL HISTORY   Past Medical History:  Diagnosis Date  . Coronary artery disease involving native coronary artery of native heart with angina pectoris ([place]) 10/03/2019   Cardiac Cath-PCI 10/02/2019:Culprit lesion mid LAD 99% heavily thrombotic lesion-->aspiration thrombectomy, DES PCI Synergy 3.0 x 23. 3.3 mm). Otherwise essentially normal coronaries with RI, LCx and RCA free of significant disease-RCA has mild proximal 20%.. Relatively normal LVEDP 7 mmHg.  Marland Kitchen History of acute anterior wall MI (did not meet full STEMI criteria) 10/02/2019   associated with AIVR & Sustained VT - ? reperfusion / ischemic arrhythmias  . Hyperlipidemia with target LDL less than 70 01/19/2016  . Hypertriglyceridemia, familial 01/19/2016  . Ischemic cardiomyopathy 10/03/2019   TTE 10/02/2019: (Post anterior MI) EF roughly 35%. Mid-apical anteroseptal and inferoseptal akinesis. Apical inferior apical anterior and apical akinesis. GR 1 DD.   Marland Kitchen Sustained ventricular tachycardia ([place]) 10/03/2019   Admitted for ACS-troponin elevation, EKG with borderline anterior STEMI findings.  Had AIVR and sustained VT during stabilization overnight.    PAST SURGICAL HISTORY   Past Surgical History:  Procedure Laterality Date  . CARDIAC CATHETERIZATION    . CORONARY STENT INTERVENTION N/A 10/02/2019   Procedure: CORONARY STENT INTERVENTION;  Surgeon: Lorretta Harp, MD;  Location: [facility]; Culprit lesion mid LAD 99% heavily thrombotic lesion-->aspiration thrombectomy, DES PCI Synergy 3.0 x 23. 3.3 mm).   . CORONARY THROMBECTOMY N/A 10/02/2019   Procedure: Coronary Thrombectomy;  Surgeon: Lorretta Harp, MD;  Location: [facility];  Service: Cardiovascular;; Heavily thrombotic 99% mLAD subTotal occlusion - Aspiration Thrombectomy of Large Red Thrombus  . GUM SURGERY   1979   gum graft  . HERNIA REPAIR     right side  . LEFT HEART CATH AND CORONARY ANGIOGRAPHY N/A 10/02/2019   Procedure: LEFT HEART CATH AND CORONARY ANGIOGRAPHY;  Surgeon: Lorretta Harp, MD;  Location: [facility];  Service: Cardiovascular;; Culprit lesion mid LAD 99% heavily thrombotic lesion-->aspiration thrombectomy, DES PCI. Otherwise essentially normal coronaries with RI, LCx and RCA free of significant disease-RCA has mild proximal 20%.. Relatively normal LVEDP 7 mmHg.  Marland Kitchen TRANSTHORACIC ECHOCARDIOGRAM  10/02/2019    (Post anterior MI) EF roughly 35%. Mid-apical anteroseptal and inferoseptal akinesis. Apical inferior apical anterior and apical  akinesis. GR 1 DD. Otherwise normal valves, normal RV and atriae  . TRANSTHORACIC ECHOCARDIOGRAM  01/01/2020   EF 40 to 45%.  Mildly reduced LV function with apical akinesis along with anterolateral akinesis.  Normal RV function.  Normal RAP.  Mild aortic dilation -39 mm   Diagnostic Dominance: Right    Intervention      Immunization History  Administered Date(s) Administered  . Tdap 08/20/2009    MEDICATIONS/ALLERGIES   Current Meds  Medication Sig  . Alirocumab (PRALUENT) 150 MG/ML SOAJ Inject 150 mg into the skin every 14 (fourteen) days.  Marland Kitchen aspirin EC 81 MG EC tablet Take 1 tablet (81 mg total) by mouth daily. Swallow whole.  Marland Kitchen atorvastatin (LIPITOR) 40 MG tablet Take 1 tablet (40 mg total) by mouth daily.  . empagliflozin (JARDIANCE) 10 MG TABS tablet Take 1 tablet (10 mg total) by mouth daily before breakfast.  . metoprolol succinate (TOPROL XL) 25 MG 24 hr tablet Take 0.5 tablets (12.5 mg total) by mouth daily.  . nitroGLYCERIN (NITROSTAT) 0.4 MG SL tablet Place 1 tablet (0.4 mg total) under the tongue every 5 (five) minutes as needed for chest pain (CP or SOB).  . ticagrelor (BRILINTA) 90 MG TABS tablet Take 1 tablet (90 mg total) by mouth 2 (two) times daily.  . [DISCONTINUED] atorvastatin (LIPITOR) 80 MG  tablet TAKE 1 TABLET BY MOUTH EVERYDAY AT BEDTIME  . [DISCONTINUED] sacubitril-valsartan (ENTRESTO) 24-26 MG Take 1 tablet by mouth 2 (two) times daily.    No Known Allergies  SOCIAL HISTORY/FAMILY HISTORY   Reviewed in Epic:  Pertinent findings:  Social History   Tobacco Use  . Smoking status: Former Smoker    Packs/day: 0.25    Years: 13.00    Pack years: 3.25    Types: Cigarettes    Quit date: 02/12/1996    Years since quitting: 24.2  . Smokeless tobacco: Never Used  Substance Use Topics  . Alcohol use: Yes    Alcohol/week: 3.0 standard drinks    Types: 1 Glasses of wine, 2 Cans of beer per week  . Drug use: No   Social History   Social History Narrative  . Not on file    OBJCTIVE -PE, EKG, labs   Wt Readings from Last 3 Encounters:  04/09/20 197 lb 6.4 oz (89.5 kg)  02/04/20 204 lb (92.5 kg)  01/20/20 194 lb 14.2 oz (88.4 kg)    Physical Exam: BP 130/75   Pulse (!) 53   Ht 6' (1.829 m)   Wt 197 lb 6.4 oz (89.5 kg)   SpO2 99%   BMI 26.77 kg/m  Physical Exam Vitals reviewed.  Constitutional:      General: He is not in acute distress.    Appearance: Normal appearance. He is normal weight. He is not ill-appearing or toxic-appearing.  HENT:     Head: Normocephalic and atraumatic.  Neck:     Vascular: No carotid bruit, hepatojugular reflux or JVD.  Cardiovascular:     Rate and Rhythm: Normal rate and regular rhythm.     Chest Wall: PMI is not displaced.     Pulses: Normal pulses.     Heart sounds: Normal heart sounds. No murmur heard. No friction rub. No gallop.   Pulmonary:     Effort: Pulmonary effort is normal. No respiratory distress.     Breath sounds: Normal breath sounds.  Chest:     Chest wall: No tenderness.  Musculoskeletal:        General:  No swelling. Normal range of motion.     Cervical back: Normal range of motion and neck supple.  Neurological:     General: No focal deficit present.     Mental Status: He is alert and oriented to  person, place, and time.     Motor: No weakness.     Gait: Gait normal.  Psychiatric:        Mood and Affect: Mood normal.        Behavior: Behavior normal.        Thought Content: Thought content normal.        Judgment: Judgment normal.     Adult ECG Report  Rate: 53 ;  Rhythm: sinus bradycardia and Normal axis, intervals and durations.;   Narrative Interpretation: Stable EKG  Recent Labs: Reviewed Lab Results  Component Value Date   CHOL 85 (L) 04/08/2020   HDL 46 04/08/2020   LDLCALC 22 04/08/2020   TRIG 84 04/08/2020   CHOLHDL 1.8 04/08/2020   Lab Results  Component Value Date   CREATININE 0.89 01/28/2020   BUN 9 01/28/2020   NA 141 01/28/2020   K 4.2 01/28/2020   CL 104 01/28/2020   CO2 22 01/28/2020   CBC Latest Ref Rng & Units 10/03/2019 10/02/2019 10/01/2019  WBC 4.0 - 10.5 K/uL 5.9 6.4 9.6  Hemoglobin 13.0 - 17.0 g/dL 14.0 14.3 16.3  Hematocrit 39.0 - 52.0 % 42.5 42.4 48.3  Platelets 150 - 400 K/uL 185 212 218    Lab Results  Component Value Date   TSH 1.807 10/02/2019    ==================================================  COVID-19 Education: The signs and symptoms of COVID-19 were discussed with the patient and how to seek care for testing (follow up with PCP or arrange E-visit).   The importance of social distancing and COVID-19 vaccination was discussed today. The patient is practicing social distancing & Masking.   I spent a total of 70 minutes with the patient spent in direct patient consultation.  Additional time spent with chart review  / charting (studies, outside notes, etc): 26 min Total Time: 96 min   Current medicines are reviewed at length with the patient today.  (+/- concerns) N/A  This visit occurred during the SARS-CoV-2 public health emergency.  Safety protocols were in place, including screening questions prior to the visit, additional usage of staff PPE, and extensive cleaning of exam room while observing appropriate contact time as  indicated for disinfecting solutions.  Notice: This dictation was prepared with Dragon dictation along with smaller phrase technology. Any transcriptional errors that result from this process are unintentional and may not be corrected upon review.  Patient Instructions / Medication Changes & Studies & Tests Ordered   Patient Instructions  Medication Instructions:  Delene Loll  Will change  One tablet in the morning and 2 tablets in the evening.   *If you need a refill on your cardiac medications before your next appointment, please call your pharmacy*   Lab Work: Lipid , cmp in 6 month before your next appointment  If you have labs (blood work) drawn today and your tests are completely normal, you will receive your results only by: Marland Kitchen MyChart Message (if you have MyChart) OR . A paper copy in the mail If you have any lab test that is abnormal or we need to change your treatment, we will call you to review the results.   Testing/Procedures:  Not needed  Follow-Up: At [facility], you and your health needs are our priority.  As part of our continuing mission to provide you with exceptional heart care, we have created designated Provider Care Teams.  These Care Teams include your primary Cardiologist (physician) and Advanced Practice Providers (APPs -  Physician Assistants and Nurse Practitioners) who all work together to provide you with the care you need, when you need it.     Your next appointment:   6 month(s)  The format for your next appointment:   In Person  Provider:   Glenetta Hew, MD   Other Instructions   foolow blood pressures -if trend should average less than or equal to 327 systolic     Studies Ordered:   Orders Placed This Encounter  Procedures  . Lipid panel  . Comprehensive metabolic panel  . EKG 12-Lead     Glenetta Hew, M.D., M.S. Interventional Cardiologist   Pager # [phone] Phone # [phone] [address] [place], [place] [address]   Thank you for choosing Heart[facility] at [facility]!!

## 2020-04-09 NOTE — Patient Instructions (Signed)
Medication Instructions:  Edward Pierce  Will change  One tablet in the morning and 2 tablets in the evening.   *If you need a refill on your cardiac medications before your next appointment, please call your pharmacy*   Lab Work: Lipid , cmp in 6 month before your next appointment  If you have labs (blood work) drawn today and your tests are completely normal, you will receive your results only by: Marland Kitchen MyChart Message (if you have MyChart) OR . A paper copy in the mail If you have any lab test that is abnormal or we need to change your treatment, we will call you to review the results.   Testing/Procedures:  Not needed  Follow-Up: At [facility], you and your health needs are our priority.  As part of our continuing mission to provide you with exceptional heart care, we have created designated Provider Care Teams.  These Care Teams include your primary Cardiologist (physician) and Advanced Practice Providers (APPs -  Physician Assistants and Nurse Practitioners) who all work together to provide you with the care you need, when you need it.     Your next appointment:   6 month(s)  The format for your next appointment:   In Person  Provider:   Glenetta Hew, MD   Other Instructions   foolow blood pressures -if trend should average less than or equal to 010 systolic

## 2020-04-18 NOTE — Progress Notes (Incomplete)
Primary Care Provider: Mayer Masker, PA-C Cardiologist: Bryan Lemma, MD Electrophysiologist: None  Clinic Note: No chief complaint on file.  ===================================  ASSESSMENT/PLAN   Problem List Items Addressed This Visit    Coronary artery disease involving native coronary artery of native heart with angina pectoris (HCC) - Primary (Chronic)   Presence of drug coated stent in LAD coronary artery (Chronic)   Ischemic cardiomyopathy (Chronic)   HFrEF (heart failure with reduced ejection fraction) (HCC) (Chronic)   Hyperlipidemia with target LDL less than 70 (Chronic)   Hypertriglyceridemia, familial (Chronic)   Periodic heart flutter      ===================================  HPI:    Edward Pierce is a 55 y.o. male with a PMH of CAD-ICM after anterior STEMI in July 2021 with HTN and HLD below who presents today for 55-month follow-up.   7/20-24/2021 (Anterior NSTEMI / Sustained VT -> Thrombotic Subtotal LAD -> thrombectomy (large red clot) - DES PCI, Severely reduced LVEF --> d/c with LifeVest; BP did not tolerate more than low dose Toprol & 25 mg Losartan. (no Entresto).  [place] since August 9  for his initial post hospital follow-up.  He was doing well at that time.  Was walking up to 20 minutes at a time 2 times a day.  No chest pain or pressure.  Has lost 12 pounds since follow-up.  Energy level is improving.  Was having a few episodes of fast heart rate spells. -> Started in cardiac rehab.  GORMAN JARMON was last seen on ***  Krista Kroeger, [place] on 01/07/2020 following echocardiogram showing improved EF up to 40 to 45%.  Still doing cardiac rehab no chest pain dyspnea, edema, PND, or orthopnea. ->  Converted from carvedilol to Toprol and losartan to [place]  Recent Hospitalizations: ***  Seen by Phillips Hay, RPH- CCP on 02/04/2020 HFmrEF with EF 40 to 45% on Toprol and Entresto-BP not able to tolerate further dosing.  Jardiance added.  Plan for  Praluent 150 mg  Reviewed  CV studies:    The following studies were reviewed today: (if available, images/films reviewed: From Epic Chart or Care Everywhere) . ECHO 01/01/20: EF 40 to 45%.  Mildly reduced LV function with  1. Left ventricular ejection fraction, by estimation, is 40 to 45%. The  left ventricle has mildly decreased function. The left ventricle  demonstrates regional wall motion abnormalities (see scoring  diagram/findings for description). There is mild  concentric left ventricular hypertrophy. Left ventricular diastolic  parameters are indeterminate. There is akinesis of the left ventricular,  entire apical segment. There is akinesis of the left ventricular, mid  anteroseptal wall.  2. Right ventricular systolic function is normal. The right ventricular  size is normal.  3. The mitral valve is normal in structure. Trivial mitral valve  regurgitation. No evidence of mitral stenosis.  4. The aortic valve is normal in structure. Aortic valve regurgitation is  trivial. No aortic stenosis is present.  5. Aortic dilatation noted. There is mild dilatation of the aortic root,  measuring 39 mm.  6. The inferior vena cava is normal in size with greater than 50%  respiratory variability, suggesting right atrial pressure of 3 mmHg.  7. Recommend limited study with definity to rule out apical thromubs.   Interval History:   Edward RUPANI   CV Review of Symptoms (Summary) Cardiovascular ROS: {roscv:310661}  The patient {does/does not:200015} have symptoms concerning for COVID-19 infection (fever, chills, cough, or new shortness of breath).   REVIEWED OF SYSTEMS  ROS   I have reviewed and (if needed) personally updated the patient's problem list, medications, allergies, past medical and surgical history, social and family history.   PAST MEDICAL HISTORY   Past Medical History:  Diagnosis Date  . Coronary artery disease involving native coronary artery of  native heart with angina pectoris ([place]) 10/03/2019   Cardiac Cath-PCI 10/02/2019:Culprit lesion mid LAD 99% heavily thrombotic lesion-->aspiration thrombectomy, DES PCI Synergy 3.0 x 23. 3.3 mm). Otherwise essentially normal coronaries with RI, LCx and RCA free of significant disease-RCA has mild proximal 20%.. Relatively normal LVEDP 7 mmHg.  Marland Kitchen History of acute anterior wall MI (did not meet full STEMI criteria) 10/02/2019   associated with AIVR & Sustained VT - ? reperfusion / ischemic arrhythmias  . Hyperlipidemia with target LDL less than 70 01/19/2016  . Hypertriglyceridemia, familial 01/19/2016  . Ischemic cardiomyopathy 10/03/2019   TTE 10/02/2019: (Post anterior MI) EF roughly 35%. Mid-apical anteroseptal and inferoseptal akinesis. Apical inferior apical anterior and apical akinesis. GR 1 DD.   Marland Kitchen Sustained ventricular tachycardia ([place]) 10/03/2019   Admitted for ACS-troponin elevation, EKG with borderline anterior STEMI findings.  Had AIVR and sustained VT during stabilization overnight.    PAST SURGICAL HISTORY   Past Surgical History:  Procedure Laterality Date  . CARDIAC CATHETERIZATION    . CORONARY STENT INTERVENTION N/A 10/02/2019   Procedure: CORONARY STENT INTERVENTION;  Surgeon: Lorretta Harp, MD;  Location: [facility]; Culprit lesion mid LAD 99% heavily thrombotic lesion-->aspiration thrombectomy, DES PCI Synergy 3.0 x 23. 3.3 mm).   . CORONARY THROMBECTOMY N/A 10/02/2019   Procedure: Coronary Thrombectomy;  Surgeon: Lorretta Harp, MD;  Location: [facility];  Service: Cardiovascular;; Heavily thrombotic 99% mLAD subTotal occlusion - Aspiration Thrombectomy of Large Red Thrombus  . GUM SURGERY  1979   gum graft  . HERNIA REPAIR     right side  . LEFT HEART CATH AND CORONARY ANGIOGRAPHY N/A 10/02/2019   Procedure: LEFT HEART CATH AND CORONARY ANGIOGRAPHY;  Surgeon: Lorretta Harp, MD;  Location: [facility];  Service: Cardiovascular;;  Culprit lesion mid LAD 99% heavily thrombotic lesion-->aspiration thrombectomy, DES PCI. Otherwise essentially normal coronaries with RI, LCx and RCA free of significant disease-RCA has mild proximal 20%.. Relatively normal LVEDP 7 mmHg.  Marland Kitchen TRANSTHORACIC ECHOCARDIOGRAM  10/02/2019    (Post anterior MI) EF roughly 35%. Mid-apical anteroseptal and inferoseptal akinesis. Apical inferior apical anterior and apical akinesis. GR 1 DD. Otherwise normal valves, normal RV and atriae    Immunization History  Administered Date(s) Administered  . Tdap 08/20/2009    MEDICATIONS/ALLERGIES   Current Meds  Medication Sig  . Alirocumab (PRALUENT) 150 MG/ML SOAJ Inject 150 mg into the skin every 14 (fourteen) days.  Marland Kitchen aspirin EC 81 MG EC tablet Take 1 tablet (81 mg total) by mouth daily. Swallow whole.  Marland Kitchen atorvastatin (LIPITOR) 80 MG tablet TAKE 1 TABLET BY MOUTH EVERYDAY AT BEDTIME  . empagliflozin (JARDIANCE) 10 MG TABS tablet Take 1 tablet (10 mg total) by mouth daily before breakfast.  . metoprolol succinate (TOPROL XL) 25 MG 24 hr tablet Take 0.5 tablets (12.5 mg total) by mouth daily.  . nitroGLYCERIN (NITROSTAT) 0.4 MG SL tablet Place 1 tablet (0.4 mg total) under the tongue every 5 (five) minutes as needed for chest pain (CP or SOB).  . sacubitril-valsartan (ENTRESTO) 24-26 MG Take 1 tablet by mouth 2 (two) times daily.  . ticagrelor (BRILINTA) 90 MG TABS tablet Take 1 tablet (  90 mg total) by mouth 2 (two) times daily.    No Known Allergies  SOCIAL HISTORY/FAMILY HISTORY   Reviewed in Epic:  Pertinent findings:  Social History   Tobacco Use  . Smoking status: Former Smoker    Packs/day: 0.25    Years: 13.00    Pack years: 3.25    Types: Cigarettes    Quit date: 02/12/1996    Years since quitting: 24.1  . Smokeless tobacco: Never Used  Substance Use Topics  . Alcohol use: Yes    Alcohol/week: 3.0 standard drinks    Types: 1 Glasses of wine, 2 Cans of beer per week  . Drug use:  No   Social History   Social History Narrative  . Not on file    OBJCTIVE -PE, EKG, labs   Wt Readings from Last 3 Encounters:  04/09/20 197 lb 6.4 oz (89.5 kg)  02/04/20 204 lb (92.5 kg)  01/20/20 194 lb 14.2 oz (88.4 kg)    Physical Exam: BP 130/75   Pulse (!) 53   Ht 6' (1.829 m)   Wt 197 lb 6.4 oz (89.5 kg)   SpO2 99%   BMI 26.77 kg/m  Physical Exam    Adult ECG Report  Rate: *** ;  Rhythm: {rhythm:17366};   Narrative Interpretation: ***  Recent Labs:  ***  Lab Results  Component Value Date   CHOL 85 (L) 04/08/2020   HDL 46 04/08/2020   LDLCALC 22 04/08/2020   TRIG 84 04/08/2020   CHOLHDL 1.8 04/08/2020   Lab Results  Component Value Date   CREATININE 0.89 01/28/2020   BUN 9 01/28/2020   NA 141 01/28/2020   K 4.2 01/28/2020   CL 104 01/28/2020   CO2 22 01/28/2020   CBC Latest Ref Rng & Units 10/03/2019 10/02/2019 10/01/2019  WBC 4.0 - 10.5 K/uL 5.9 6.4 9.6  Hemoglobin 13.0 - 17.0 g/dL 14.0 14.3 16.3  Hematocrit 39.0 - 52.0 % 42.5 42.4 48.3  Platelets 150 - 400 K/uL 185 212 218    Lab Results  Component Value Date   TSH 1.807 10/02/2019    ==================================================  COVID-19 Education: The signs and symptoms of COVID-19 were discussed with the patient and how to seek care for testing (follow up with PCP or arrange E-visit).   The importance of social distancing and COVID-19 vaccination was discussed today. The patient {ACTION; IS/IS [id number]} practicing social distancing & Masking.   I spent a total of ***minutes with the patient spent in direct patient consultation.  Additional time spent with chart review  / charting (studies, outside notes, etc): *** min Total Time: *** min   Current medicines are reviewed at length with the patient today.  (+/- concerns) ***  This visit occurred during the SARS-CoV-2 public health emergency.  Safety protocols were in place, including screening questions prior to the visit,  additional usage of staff PPE, and extensive cleaning of exam room while observing appropriate contact time as indicated for disinfecting solutions.  Notice: This dictation was prepared with Dragon dictation along with smaller phrase technology. Any transcriptional errors that result from this process are unintentional and may not be corrected upon review.  Patient Instructions / Medication Changes & Studies & Tests Ordered   There are no Patient Instructions on file for this visit.   Studies Ordered:   No orders of the defined types were placed in this encounter.    Glenetta Hew, M.D., M.S. Interventional Cardiologist   Pager # [phone] Phone #  [address] [place], [place] [address]   Thank you for choosing Heartcare at [facility]!!

## 2020-04-19 ENCOUNTER — Encounter: Payer: Self-pay | Admitting: Cardiology

## 2020-04-19 NOTE — Assessment & Plan Note (Signed)
Notable improvement of his EF up to now 40 to 45%.  I suspect that it probably may be closer to 67.  There is a significant anterior hypokinesis from his the anterior wall MI.  He was converted to carvedilol to Toprol because of bradycardia and borderline pressures.  This allowed [place] to start Entresto. He was also started on Jardiance as an SGLT2 inhibitor.  Plan:  We will try to titrate Entresto up to the second dose.  We will start with heparin and double his evening dose if tolerated, titrate up to 2 tabs twice daily.  We can then change to dose for his prescription.  Continue Jardiance along with low-dose Toprol  Not requiring diuretic

## 2020-04-19 NOTE — Assessment & Plan Note (Signed)
Plan discontinue aspirin and Brilinta at 90 mg twice daily through July 2022.  At that point, the plan will be to reduce to maintenance dose Brilinta for least another year at 60 mg twice daily and stop aspirin.  Beyond that, we reviewed the use clopidogrel or continue maintenance dose Brilinta. As of August 2022, okay to temporarily hold Thienopyridine (Brilinta or Plavix) for procedures.

## 2020-04-19 NOTE — Assessment & Plan Note (Signed)
Was not quite at goal on atorvastatin.  Started on Praluent and doing very well. Lipids look outstanding.    At this point I think we may really reduce his atorvastatin to 40 mg.  Would like to avoid side effects since he is having a little bit of of fatigue here and there.

## 2020-04-19 NOTE — Assessment & Plan Note (Addendum)
>>  ASSESSMENT AND PLAN FOR HYPERTRIGLYCERIDEMIA, FAMILIAL WRITTEN ON 04/19/2020 12:23 AM BY Glendene Wyer W, MD  Most recent triglycerides look great with Praluent added to [facility]  >>ASSESSMENT AND PLAN FOR HYPERLIPIDEMIA WITH TARGET LDL LESS THAN 70 WRITTEN ON 04/19/2020 12:24 AM BY Berlinda Farve W, MD  Was not quite at goal on atorvastatin.  Started on Praluent and doing very well. Lipids look outstanding.    At this point I think we may really reduce his atorvastatin to 40 mg.  Would like to avoid side effects since he is having a little bit of of fatigue here and there.

## 2020-04-19 NOTE — Assessment & Plan Note (Signed)
Single-vessel CAD with LAD PCI.  No further angina.  He is doing quite well since his heart attack.   No further angina.  No heart failure symptoms.  Plan: Continue aspirin and Brilinta for now.  No bleeding issues.  I suspect we can probably reduce his atorvastatin to 40 mg since he is now also on Praluent.  He is on stable dose of Toprol which I am leery of adjusting further because of bradycardia 50 bpm.  We will start Entresto which I think can titrate further.

## 2020-04-19 NOTE — Assessment & Plan Note (Signed)
Continue to monitor his fluttering sensations.  If they continue to persist, would probably order a cardiac event monitor, otherwise we will simply continue current dose of beta-blocker.  I think prolonged.Marland Kitchen

## 2020-05-05 ENCOUNTER — Telehealth: Payer: Self-pay | Admitting: Cardiology

## 2020-05-05 NOTE — Telephone Encounter (Signed)
° ° °  1. What dental office are you calling from? [facility]   2. What is your office phone number? [phone]  3. What is your fax number? [phone]  4. What type of procedure is the patient having performed? Teeth cleaning  5. What date is procedure scheduled or is the patient there now? Tomorrow if they can get clearance today if not pt is scheduled on 06/08/20 (if the patient is at the dentist's office question goes to their cardiologist if he/she is in the office.  If not, question should go to the DOD).   6. What is your question (ex. Antibiotics prior to procedure, holding medication-we need to know how long dentist wants pt to hold med)? If pt needs to pre-medicate and if pt needs to hold blood thinner

## 2020-05-05 NOTE — Telephone Encounter (Signed)
   Primary Cardiologist: Glenetta Hew, MD  Chart reviewed as part of pre-operative protocol coverage.   Simple dental extractions are considered low risk procedures per guidelines and generally do not require any specific cardiac clearance. It is also generally accepted that for simple extractions and dental cleanings, there is no need to interrupt blood thinner therapy.   SBE prophylaxis is not required for the patient from a cardiac standpoint.  I will route this recommendation to the requesting party via Epic fax function and remove from pre-op pool.  Please call with questions.  Loel Dubonnet, NP 05/05/2020, 3:56 PM

## 2020-05-27 ENCOUNTER — Other Ambulatory Visit: Payer: Self-pay | Admitting: Cardiology

## 2020-09-12 ENCOUNTER — Other Ambulatory Visit: Payer: Self-pay | Admitting: Student

## 2020-09-29 ENCOUNTER — Other Ambulatory Visit: Payer: Self-pay

## 2020-09-29 MED ORDER — PRALUENT 150 MG/ML ~~LOC~~ SOAJ: 150.0000 mg | SUBCUTANEOUS | 11 refills | Status: DC

## 2020-11-05 DIAGNOSIS — I25119 Atherosclerotic heart disease of native coronary artery with unspecified angina pectoris: Secondary | ICD-10-CM | POA: Diagnosis not present

## 2020-11-05 DIAGNOSIS — Z955 Presence of coronary angioplasty implant and graft: Secondary | ICD-10-CM | POA: Diagnosis not present

## 2020-11-05 DIAGNOSIS — E781 Pure hyperglyceridemia: Secondary | ICD-10-CM | POA: Diagnosis not present

## 2020-11-05 DIAGNOSIS — I255 Ischemic cardiomyopathy: Secondary | ICD-10-CM | POA: Diagnosis not present

## 2020-11-05 DIAGNOSIS — E785 Hyperlipidemia, unspecified: Secondary | ICD-10-CM | POA: Diagnosis not present

## 2020-11-06 LAB — LIPID PANEL
Chol/HDL Ratio: 1.8 ratio (ref 0.0–5.0)
Cholesterol, Total: 90 mg/dL — ABNORMAL LOW (ref 100–199)
HDL: 49 mg/dL (ref >39)
LDL Chol Calc (NIH): 15 mg/dL (ref 0–99)
Triglycerides: 161 mg/dL — ABNORMAL HIGH (ref 0–149)
VLDL Cholesterol Cal: 26 mg/dL (ref 5–40)

## 2020-11-06 LAB — COMPREHENSIVE METABOLIC PANEL
ALT: 25 IU/L (ref 0–44)
AST: 27 IU/L (ref 0–40)
Albumin/Globulin Ratio: 2.5 — ABNORMAL HIGH (ref 1.2–2.2)
Albumin: 5 g/dL — ABNORMAL HIGH (ref 3.8–4.9)
Alkaline Phosphatase: 87 IU/L (ref 44–121)
BUN/Creatinine Ratio: 12 (ref 9–20)
BUN: 12 mg/dL (ref 6–24)
Bilirubin Total: 0.8 mg/dL (ref 0.0–1.2)
CO2: 21 mmol/L (ref 20–29)
Calcium: 9.6 mg/dL (ref 8.7–10.2)
Chloride: 103 mmol/L (ref 96–106)
Creatinine, Ser: 1.03 mg/dL (ref 0.76–1.27)
Globulin, Total: 2 g/dL (ref 1.5–4.5)
Glucose: 99 mg/dL (ref 65–99)
Potassium: 4.6 mmol/L (ref 3.5–5.2)
Sodium: 142 mmol/L (ref 134–144)
Total Protein: 7 g/dL (ref 6.0–8.5)
eGFR: 86 mL/min/{1.73_m2} (ref >59)

## 2020-11-06 MED ORDER — NITROGLYCERIN 0.4 MG SL SUBL: SUBLINGUAL_TABLET | SUBLINGUAL | 2 refills | Status: DC

## 2020-11-13 NOTE — Progress Notes (Signed)
Patient has initial visit 11/17/2020

## 2020-11-17 ENCOUNTER — Other Ambulatory Visit: Payer: Self-pay

## 2020-11-17 ENCOUNTER — Ambulatory Visit (INDEPENDENT_AMBULATORY_CARE_PROVIDER_SITE_OTHER): Payer: BC Managed Care – PPO | Admitting: Nurse Practitioner

## 2020-11-17 ENCOUNTER — Encounter: Payer: Self-pay | Admitting: Nurse Practitioner

## 2020-11-17 VITALS — BP 117/79 | HR 61 | Temp 98.0°F | Ht 72.0 in | Wt 193.4 lb

## 2020-11-17 DIAGNOSIS — I25119 Atherosclerotic heart disease of native coronary artery with unspecified angina pectoris: Secondary | ICD-10-CM | POA: Diagnosis not present

## 2020-11-17 DIAGNOSIS — Z7689 Persons encountering health services in other specified circumstances: Secondary | ICD-10-CM

## 2020-11-17 DIAGNOSIS — Z6826 Body mass index (BMI) 26.0-26.9, adult: Secondary | ICD-10-CM | POA: Diagnosis not present

## 2020-11-17 NOTE — Progress Notes (Signed)
New Patient Office Visit  Subjective:  Patient ID: Edward Pierce, male    DOB: June 08, 1965  Age: 55 y.o. MRN: [id number]  CC:  Chief Complaint  Patient presents with   New Patient (Initial Visit)     HPI Edward Pierce presents to establish new primary care provider.  States that last year he did have a myocardial infarction.  Had a 99% blockage of LAD.  Dr. Ellyn Hack is his cardiologist.  Recently had check of fasting lipids and CMP per cardiology.  Both are good.    Lipid Panel     Component Value Date/Time   CHOL 90 (L) 11/05/2020 1514   TRIG 161 (H) 11/05/2020 1514   HDL 49 11/05/2020 1514   CHOLHDL 1.8 11/05/2020 1514   CHOLHDL 6.9 10/02/2019 0321   VLDL 35 10/02/2019 0321   LDLCALC 15 11/05/2020 1514   LABVLDL 26 11/05/2020 1514    States that following his MI, he did have congestive heart failure.  Had to be on a portable defibrillator for approximately 3 months after his heart attack.  Has been able to be weaned off this.  Patient states he primarily feels good now.  States breathing easier as long as he is upright.  Goes easy with exertion.  Does still get short of breath easily, but heart function continues to improve. Patient has no new concerns or complaints today.  Does need to have check of PSA, CBC, and thyroid panel.  He is due to have a routine physical exam.   Past Medical History:  Diagnosis Date   Coronary artery disease involving native coronary artery of native heart with angina pectoris ([place]) 10/03/2019   Cardiac Cath-PCI 10/02/2019: Culprit lesion mid LAD 99% heavily thrombotic lesion--> aspiration thrombectomy, DES PCI Synergy 3.0 x 23. 3.3 mm).  Otherwise essentially normal coronaries with RI, LCx and RCA free of significant disease-RCA has mild proximal 20%..  Relatively normal LVEDP 7 mmHg.   History of acute anterior wall MI (did not meet full STEMI criteria) 10/02/2019   associated with AIVR & Sustained VT - ? reperfusion / ischemic arrhythmias    Hyperlipidemia with target LDL less than 70 01/19/2016   Hypertriglyceridemia, familial 01/19/2016   Ischemic cardiomyopathy 10/03/2019   TTE 10/02/2019: (Post anterior MI) EF roughly 35%.  Mid-apical anteroseptal and inferoseptal akinesis.  Apical inferior apical anterior and apical akinesis.  GR 1 DD.     Sustained ventricular tachycardia ([place]) 10/03/2019   Admitted for ACS-troponin elevation, EKG with borderline anterior STEMI findings.  Had AIVR and sustained VT during stabilization overnight.    Past Surgical History:  Procedure Laterality Date   CARDIAC CATHETERIZATION     CORONARY STENT INTERVENTION N/A 10/02/2019   Procedure: CORONARY STENT INTERVENTION;  Surgeon: Lorretta Harp, MD;  Location: [facility]; Culprit lesion mid LAD 99% heavily thrombotic lesion--> aspiration thrombectomy, DES PCI Synergy 3.0 x 23. 3.3 mm).     CORONARY THROMBECTOMY N/A 10/02/2019   Procedure: Coronary Thrombectomy;  Surgeon: Lorretta Harp, MD;  Location: [facility];  Service: Cardiovascular;; Heavily thrombotic 99% mLAD subTotal occlusion - Aspiration Thrombectomy of Large Red Thrombus   GUM SURGERY  1979   gum graft   HERNIA REPAIR     right side   LEFT HEART CATH AND CORONARY ANGIOGRAPHY N/A 10/02/2019   Procedure: LEFT HEART CATH AND CORONARY ANGIOGRAPHY;  Surgeon: Lorretta Harp, MD;  Location: [facility];  Service: Cardiovascular;; Culprit lesion mid LAD  99% heavily thrombotic lesion--> aspiration thrombectomy, DES PCI.  Otherwise essentially normal coronaries with RI, LCx and RCA free of significant disease-RCA has mild proximal 20%..  Relatively normal LVEDP 7 mmHg.   TRANSTHORACIC ECHOCARDIOGRAM  10/02/2019    (Post anterior MI) EF roughly 35%.  Mid-apical anteroseptal and inferoseptal akinesis.  Apical inferior apical anterior and apical akinesis.  GR 1 DD.  Otherwise normal valves, normal RV and atriae   TRANSTHORACIC ECHOCARDIOGRAM  01/01/2020   EF 40 to 45%.  Mildly  reduced LV function with apical akinesis along with anterolateral akinesis.  Normal RV function.  Normal RAP.  Mild aortic dilation -39 mm    Family History  Problem Relation Age of Onset   Cancer Mother        breast   Diabetes Mother    Hypertension Mother    Heart disease Mother        CAD   Breast cancer Mother    Hyperlipidemia Father    COPD Father    Heart disease Father        CAD   Heart attack Paternal Grandfather     Social History   Socioeconomic History   Marital status: Married    Spouse name: Not on file   Number of children: 2   Years of education: 12   Highest education level: High school graduate  Occupational History    Employer: [employer]    Comment: He works from home  Tobacco Use   Smoking status: Former    Packs/day: 0.25    Years: 13.00    Pack years: 3.25    Types: Cigarettes    Quit date: 02/12/1996    Years since quitting: 24.7   Smokeless tobacco: Never  Substance and Sexual Activity   Alcohol use: Yes    Alcohol/week: 3.0 standard drinks    Types: 1 Glasses of wine, 2 Cans of beer per week   Drug use: No   Sexual activity: Yes    Birth control/protection: None  Other Topics Concern   Not on file  Social History Narrative   Not on file   Social Determinants of Health   Financial Resource Strain: Not on file  Food Insecurity: Not on file  Transportation Needs: Not on file  Physical Activity: Not on file  Stress: Not on file  Social Connections: Not on file  Intimate Partner Violence: Not on file    ROS Review of Systems  Constitutional:  Negative for activity change, chills, fatigue and fever.  HENT:  Negative for congestion, postnasal drip, rhinorrhea, sinus pressure, sinus pain, sneezing and sore throat.   Eyes: Negative.   Respiratory:  Negative for cough, shortness of breath and wheezing.   Cardiovascular:  Negative for chest pain and palpitations.       Some shortness of breath with mild exertion.  Gradually improving.   Gastrointestinal:  Negative for constipation, diarrhea, nausea and vomiting.  Endocrine: Negative for cold intolerance, heat intolerance, polydipsia and polyuria.  Genitourinary:  Negative for dysuria, frequency and urgency.  Musculoskeletal:  Negative for back pain and myalgias.  Skin:  Negative for rash.  Allergic/Immunologic: Negative for environmental allergies.  Neurological:  Negative for dizziness, weakness and headaches.  Psychiatric/Behavioral:  The patient is not nervous/anxious.    Objective:   Today's Vitals   11/17/20 1449  BP: 117/79  Pulse: 61  Temp: 98 F (36.7 C)  SpO2: 98%  Weight: 193 lb 6.4 oz (87.7 kg)  Height: 6' (1.829 m)  Body mass index is 26.23 kg/m.   Physical Exam Vitals and nursing note reviewed.  Constitutional:      Appearance: Normal appearance. He is well-developed.  HENT:     Head: Normocephalic and atraumatic.  Eyes:     Pupils: Pupils are equal, round, and reactive to light.  Cardiovascular:     Rate and Rhythm: Normal rate and regular rhythm.     Pulses: Normal pulses.     Heart sounds: Normal heart sounds.  Pulmonary:     Effort: Pulmonary effort is normal.     Breath sounds: Normal breath sounds.  Abdominal:     Palpations: Abdomen is soft.  Musculoskeletal:        General: Normal range of motion.     Cervical back: Normal range of motion and neck supple.  Lymphadenopathy:     Cervical: No cervical adenopathy.  Skin:    General: Skin is warm and dry.     Capillary Refill: Capillary refill takes less than 2 seconds.  Neurological:     General: No focal deficit present.     Mental Status: He is alert and oriented to person, place, and time.  Psychiatric:        Mood and Affect: Mood normal.        Behavior: Behavior normal.        Thought Content: Thought content normal.        Judgment: Judgment normal.    Assessment & Plan:  1. Encounter to establish care Appointment today to establish new primary care  provider.  2. Coronary artery disease involving native coronary artery of native heart with angina pectoris [facility]) Patient continuing to improve after having heart attack last year with 99% blockage of LAD.  To be monitored per cardiology.  3. Body mass index 26.0-26.9, adult Advised to limit calorie intake to 2000 cal/day, consuming a low-salt,low-fat, low-cholesterol diet.  He should continue to gradually incorporate exercise into his daily routine.  Problem List Items Addressed This Visit       Cardiovascular and Mediastinum   Coronary artery disease involving native coronary artery of native heart with angina pectoris ([place]) (Chronic)   Other Visit Diagnoses     Encounter to establish care    -  Primary   Body mass index 26.0-26.9, adult           Outpatient Encounter Medications as of 11/17/2020  Medication Sig   Alirocumab (PRALUENT) 150 MG/ML SOAJ Inject 150 mg into the skin every 14 (fourteen) days.   empagliflozin (JARDIANCE) 10 MG TABS tablet Take 1 tablet (10 mg total) by mouth daily before breakfast.   metoprolol succinate (TOPROL XL) 25 MG 24 hr tablet Take 0.5 tablets (12.5 mg total) by mouth daily.   nitroGLYCERIN (NITROSTAT) 0.4 MG SL tablet Place 1 tablet under the tongue every 5 minutes as needed for chest pain, up to 3 doses in 15 minutes   sacubitril-valsartan (ENTRESTO) 24-26 MG Take 1 tablet of 24/26 mg in the morning and 2 tablets  24/26 mg in the evening   [DISCONTINUED] aspirin EC 81 MG EC tablet Take 1 tablet (81 mg total) by mouth daily. Swallow whole.   [DISCONTINUED] ticagrelor (BRILINTA) 90 MG TABS tablet TAKE 1 TABLET BY MOUTH TWICE A DAY   [DISCONTINUED] atorvastatin (LIPITOR) 40 MG tablet Take 1 tablet (40 mg total) by mouth daily.   No facility-administered encounter medications on file as of 11/17/2020.   This note was dictated using Systems analyst.  Rapid proofreading was performed to expedite the delivery of the information. Despite  proofreading, phonetic errors will occur which are common with this voice recognition software. Please take this into consideration. If there are any concerns, please contact our office.    Follow-up: Return in about 6 weeks (around 12/29/2020) for CPE at time of visit PSA CBC TSH.   Ronnell Freshwater, NP

## 2020-11-18 ENCOUNTER — Ambulatory Visit (INDEPENDENT_AMBULATORY_CARE_PROVIDER_SITE_OTHER): Payer: BC Managed Care – PPO | Admitting: Cardiology

## 2020-11-18 ENCOUNTER — Encounter: Payer: Self-pay | Admitting: Cardiology

## 2020-11-18 VITALS — BP 104/66 | HR 54 | Ht 72.0 in | Wt 194.2 lb

## 2020-11-18 DIAGNOSIS — I25119 Atherosclerotic heart disease of native coronary artery with unspecified angina pectoris: Secondary | ICD-10-CM | POA: Diagnosis not present

## 2020-11-18 DIAGNOSIS — I472 Ventricular tachycardia, unspecified: Secondary | ICD-10-CM

## 2020-11-18 DIAGNOSIS — I255 Ischemic cardiomyopathy: Secondary | ICD-10-CM | POA: Diagnosis not present

## 2020-11-18 DIAGNOSIS — E785 Hyperlipidemia, unspecified: Secondary | ICD-10-CM

## 2020-11-18 DIAGNOSIS — I5020 Unspecified systolic (congestive) heart failure: Secondary | ICD-10-CM | POA: Diagnosis not present

## 2020-11-18 DIAGNOSIS — Z955 Presence of coronary angioplasty implant and graft: Secondary | ICD-10-CM | POA: Diagnosis not present

## 2020-11-18 DIAGNOSIS — R030 Elevated blood-pressure reading, without diagnosis of hypertension: Secondary | ICD-10-CM

## 2020-11-18 MED ORDER — ATORVASTATIN CALCIUM 20 MG PO TABS
20.0000 mg | ORAL_TABLET | Freq: Every day | ORAL | 3 refills | Status: DC
Start: 2020-11-18 — End: 2021-06-18

## 2020-11-18 MED ORDER — TICAGRELOR 60 MG PO TABS: 60.0000 mg | ORAL_TABLET | Freq: Two times a day (BID) | ORAL | 3 refills | Status: DC

## 2020-11-18 NOTE — Patient Instructions (Addendum)
Medication Instructions:  Stop taking Aspirin    Decrease to Brilinta 60 mg twice a day once current bottle og 90 mg is complete  Decrease to taking 20 mg Atorvastatin daily preferable at bedtime   *If you need a refill on your cardiac medications before your next appointment, please call your pharmacy*   Lab Work: In 6 month - March 2023 prior to next office visit Fasting Lipid CMP If you have labs (blood work) drawn today and your tests are completely normal, you will receive your results only by: [facility] (if you have MyChart) OR A paper copy in the mail If you have any lab test that is abnormal or we need to change your treatment, we will call you to review the results.   Testing/Procedures: Not needed   Follow-Up: At [facility], you and your health needs are our priority.  As part of our continuing mission to provide you with exceptional heart care, we have created designated Provider Care Teams.  These Care Teams include your primary Cardiologist (physician) and Advanced Practice Providers (APPs -  Physician Assistants and Nurse Practitioners) who all work together to provide you with the care you need, when you need it.     Your next appointment:   6 month(s)  The format for your next appointment:   In Person  Provider:   You will see one of the following Advanced Practice Providers on your designated Care Team:   Rosaria Ferries, PA-C Caron Presume, PA-C Jory Sims, DNP, ANP Sande Rives NP  Then, Glenetta Hew, MD will plan to see you again in 12 month(s).   Other Instructions

## 2020-11-18 NOTE — Progress Notes (Signed)
Primary Care Provider: Ronnell Freshwater, NP Cardiologist: Glenetta Hew, MD Electrophysiologist: None  Clinic Note: Chief Complaint  Patient presents with   Follow-up    ; now past 1 year from MI.  Doing well.   Coronary Artery Disease    No angina   Cardiomyopathy    No CHF symptoms.    ===================================  ASSESSMENT/PLAN   Problem List Items Addressed This Visit       Cardiology Problems   Coronary artery disease involving native coronary artery of native heart with angina pectoris ([place]) - Primary (Chronic)    Single-vessel CAD with PCI to the LAD in the setting of anterior STEMI.  Unfortunately, he was late presenting, and resulted in ischemic cardiomyopathy.  Thankfully no recurrent angina or heart failure symptoms.  Plan: On stable low-dose Toprol and Entresto along with Jardiance.  No change. With the addition of Praluent, we are able to reduce atorvastatin dose.  Continue to reduce to 20 mg daily. He is now more than 1 year out from his PCI.  Okay to stop aspirin. Reduce Brilinta to 60 mg twice daily after current bottle.      Relevant Medications   atorvastatin (LIPITOR) 20 MG tablet   ticagrelor (BRILINTA) 60 MG TABS tablet   Other Relevant Orders   Lipid panel   Comprehensive metabolic panel   Ischemic cardiomyopathy (Chronic)    EF now stable at 40 to 45% probably more like 45% with LAD wall motion hypokinetic kinesis.  Thankfully, only having NYHA class I symptoms with minimal exertional dyspnea.  With a conversion of carvedilol  to Toprol  for bradycardia, we and we will start Entresto and Jardiance.  Plan: With his current blood pressure being in the 100s, no room to titrate up Entresto. Continue low-dose Toprol and Entresto.  No room to titrate further. Continue Jardiance. No diuretic requirement.        Relevant Medications   atorvastatin (LIPITOR) 20 MG tablet   ticagrelor (BRILINTA) 60 MG TABS tablet   Other Relevant  Orders   Comprehensive metabolic panel   HFrEF (heart failure with reduced ejection fraction) (HCC) (Chronic)    HFmrEF with EF now 40 to 45%.  NYHA class I symptoms.  On stable low-dose Entresto, Toprol along with Jardiance.  No diuretic requirement.      Relevant Medications   atorvastatin (LIPITOR) 20 MG tablet   Sustained ventricular tachycardia (HCC)    In the setting of anterior MI.  He had a PRN sustained VT overnight during stabilization.  No further episodes.  With EF above 40%, LifeVest turned and in no need for ICD. Continue beta-blocker at low-dose.      Relevant Medications   atorvastatin (LIPITOR) 20 MG tablet   Hyperlipidemia with target LDL less than 70 (Chronic)    Labs still look outstanding on 40 mg of atorvastatin plus Praluent.  This is along with his weight loss and dietary changes.    At this point I think we can reduce atorvastatin to 20 mg daily with LDL at 15 now.  We can recheck in about 6 months..      Relevant Medications   atorvastatin (LIPITOR) 20 MG tablet   Other Relevant Orders   Lipid panel   Comprehensive metabolic panel     Other   Presence of drug coated stent in LAD coronary artery (Chronic)    Has completed 1 year of uninterrupted DAPT-aspirin and Brilinta 90 mg twice daily.  Plan: Stop aspirin Reduce  Brilinta to 60 mg twice daily at next refill. Okay to hold Brilinta 5-7 days preop for surgeries or procedures.      Relevant Medications   ticagrelor (BRILINTA) 60 MG TABS tablet   Elevated blood pressure reading without diagnosis of hypertension (Chronic)    He clearly does not really have hypertension, and is barely tolerating low-dose Toprol (12.5 mg daily) and Entresto (24-26 mg twice daily).  Unable to titrate further.       ===================================  HPI:    Edward Pierce is a 55 y.o. male with a PMH notable for CAD-ICM after anterior STEMI in July 2021, HTN, HLD who presents today for delayed 72-month follow-up.  7/20-24/2021 (Anterior NSTEMI / Sustained VT -> Thrombotic Subtotal LAD -> thrombectomy (large red clot) - DES PCI, Severely reduced LVEF --> d/c with LifeVest; BP did not tolerate more than low dose Toprol & 25 mg Losartan. (no Entresto). F/u Echo 01/01/20 - EF up to 40-45%.  Anteroseptal & Apical Akinesis (LAD territory).  NO LV thrombus.   KZAMARI AURIGEMMAwas last seen on April 09, 2020.  Doing well overall, had multiple questions.  Was able to walk least 30 minutes a day.  Enjoyed cardiac rehab.  A little more tired in the morning than usual but doing okay.  Short little flutter spells.  Recent Hospitalizations: None  Reviewed  CV studies:    The following studies were reviewed today: (if available, images/films reviewed: From Epic Chart or Care Everywhere) None:  Interval History:   Edward KOEHNKEpresents here today overall doing pretty well.  His weights have been up and down, but pretty much stable and downtrending.  He has had some setbacks with his diet.  He is most notably less anxious and is much more active.  He denies any recurrent chest pain or pressure with rest or exertion.  No exertional dyspnea.  No CHF symptoms of PND, orthopnea with maybe mild end of day edema.  Rare fleeting palpitations lasting less than 5 seconds.  These occur maybe once or twice every couple weeks.  He seems to be doing very well with balancing out his stress with work and home and that seems to be helping out the palpitations.  CV Review of Symptoms (Summary) Cardiovascular ROS: no chest pain or dyspnea on exertion positive for - palpitations, rapid heart rate, and very short little fleeting spells lasting 5 to 10 seconds occurring maybe once or twice a week.  No associated lightheadedness or dizziness. negative for - orthopnea, paroxysmal nocturnal dyspnea, shortness of breath, or syncope/near syncope or TIA/amaurosis fugax, claudication  REVIEWED OF SYSTEMS   Review of Systems   Constitutional:  Negative for malaise/fatigue (Energy level notably better.) and weight loss (Weight goes up and down 5 or 6 pounds at a time, currently on a downtrend.).  HENT:  Negative for congestion and nosebleeds.   Respiratory:  Negative for cough and shortness of breath.   Cardiovascular:  Negative for claudication.       Per HPI.  Gastrointestinal:  Negative for blood in stool and melena.  Genitourinary:  Negative for hematuria.  Musculoskeletal:  Negative for joint pain and myalgias.  Neurological:  Negative for dizziness and focal weakness.  Psychiatric/Behavioral:  Negative for depression and memory loss. The patient is not nervous/anxious (Seems to be managing his stress really well.) and does not have insomnia (Sleeping better).    I have reviewed and (if needed) personally updated the patient's problem list, medications, allergies,  past medical and surgical history, social and family history.   PAST MEDICAL HISTORY   Past Medical History:  Diagnosis Date   Coronary artery disease involving native coronary artery of native heart with angina pectoris ([place]) 10/03/2019   Cardiac Cath-PCI 10/02/2019: Culprit lesion mid LAD 99% heavily thrombotic lesion--> aspiration thrombectomy, DES PCI Synergy 3.0 x 23. 3.3 mm).  Otherwise essentially normal coronaries with RI, LCx and RCA free of significant disease-RCA has mild proximal 20%..  Relatively normal LVEDP 7 mmHg.   History of acute anterior wall MI (did not meet full STEMI criteria) 10/02/2019   associated with AIVR & Sustained VT - ? reperfusion / ischemic arrhythmias   Hyperlipidemia with target LDL less than 70 01/19/2016   Hypertriglyceridemia, familial 01/19/2016   Ischemic cardiomyopathy 10/03/2019   TTE 10/02/2019: (Post anterior MI) EF roughly 35%.  Mid-apical anteroseptal and inferoseptal akinesis.  Apical inferior apical anterior and apical akinesis.  GR 1 DD.     Sustained ventricular tachycardia ([place]) 10/03/2019   Admitted  for ACS-troponin elevation, EKG with borderline anterior STEMI findings.  Had AIVR and sustained VT during stabilization overnight.    PAST SURGICAL HISTORY   Past Surgical History:  Procedure Laterality Date   CARDIAC CATHETERIZATION     CORONARY STENT INTERVENTION N/A 10/02/2019   Procedure: CORONARY STENT INTERVENTION;  Surgeon: Lorretta Harp, MD;  Location: [place] [facility]; Culprit lesion mid LAD 99% heavily thrombotic lesion--> aspiration thrombectomy, DES PCI Synergy 3.0 x 23. 3.3 mm).     CORONARY THROMBECTOMY N/A 10/02/2019   Procedure: Coronary Thrombectomy;  Surgeon: Lorretta Harp, MD;  Location: [place] [facility];  Service: Cardiovascular;; Heavily thrombotic 99% mLAD subTotal occlusion - Aspiration Thrombectomy of Large Red Thrombus   GUM SURGERY  1979   gum graft   HERNIA REPAIR     right side   LEFT HEART CATH AND CORONARY ANGIOGRAPHY N/A 10/02/2019   Procedure: LEFT HEART CATH AND CORONARY ANGIOGRAPHY;  Surgeon: Lorretta Harp, MD;  Location: [place] [facility];  Service: Cardiovascular;; Culprit lesion mid LAD 99% heavily thrombotic lesion--> aspiration thrombectomy, DES PCI.  Otherwise essentially normal coronaries with RI, LCx and RCA free of significant disease-RCA has mild proximal 20%..  Relatively normal LVEDP 7 mmHg.   TRANSTHORACIC ECHOCARDIOGRAM  10/02/2019    (Post anterior MI) EF roughly 35%.  Mid-apical anteroseptal and inferoseptal akinesis.  Apical inferior apical anterior and apical akinesis.  GR 1 DD.  Otherwise normal valves, normal RV and atriae   TRANSTHORACIC ECHOCARDIOGRAM  01/01/2020   EF 40 to 45%.  Mildly reduced LV function with apical akinesis along with anterolateral akinesis.  Normal RV function.  Normal RAP.  Mild aortic dilation -39 mm   10/02/19: Post PCI Diagram: Synergy DES 3.0 x 23 --> 3.3 mm       Immunization History  Administered Date(s) Administered   PFIZER(Purple Top)SARS-COV-2 Vaccination 11/12/2019, 12/03/2019   Tdap  08/20/2009    MEDICATIONS/ALLERGIES   Current Meds  Medication Sig   Alirocumab (PRALUENT) 150 MG/ML SOAJ Inject 150 mg into the skin every 14 (fourteen) days.   aspirin EC 81 MG EC tablet Take 1 tablet (81 mg total) by mouth daily. Swallow whole.   atorvastatin (LIPITOR) 40 MG tablet Take 1 tablet (40 mg total) by mouth daily.   empagliflozin (JARDIANCE) 10 MG TABS tablet Take 1 tablet (10 mg total) by mouth daily before breakfast.   metoprolol succinate (TOPROL XL) 25 MG 24 hr tablet Take 0.5  tablets (12.5 mg total) by mouth daily.   nitroGLYCERIN (NITROSTAT) 0.4 MG SL tablet Place 1 tablet under the tongue every 5 minutes as needed for chest pain, up to 3 doses in 15 minutes   sacubitril-valsartan (ENTRESTO) 24-26 MG Take 1 tablet of 24/26 mg in the morning and 2 tablets  24/26 mg in the evening   ticagrelor (BRILINTA) 90 MG TABS tablet TAKE 1 TABLET BY MOUTH TWICE A DAY    No Known Allergies  SOCIAL HISTORY/FAMILY HISTORY   Reviewed in Epic:  Pertinent findings:  Social History   Tobacco Use   Smoking status: Former    Packs/day: 0.25    Years: 13.00    Pack years: 3.25    Types: Cigarettes    Quit date: 02/12/1996    Years since quitting: 24.8   Smokeless tobacco: Never  Substance Use Topics   Alcohol use: Yes    Alcohol/week: 3.0 standard drinks    Types: 1 Glasses of wine, 2 Cans of beer per week   Drug use: No   Social History   Social History Narrative   Not on file    OBJCTIVE -PE, EKG, labs   Wt Readings from Last 3 Encounters:  11/18/20 194 lb 3.2 oz (88.1 kg)  11/17/20 193 lb 6.4 oz (87.7 kg)  04/09/20 197 lb 6.4 oz (89.5 kg)    Physical Exam: BP 104/66   Pulse (!) 54   Ht 6' (1.829 m)   Wt 194 lb 3.2 oz (88.1 kg)   SpO2 98%   BMI 26.34 kg/m  Physical Exam Vitals reviewed.  Constitutional:      General: He is not in acute distress.    Appearance: Normal appearance. He is normal weight. He is not ill-appearing (Healthy-appearing.   Well-groomed.) or toxic-appearing.  HENT:     Head: Normocephalic and atraumatic.  Neck:     Vascular: No carotid bruit or JVD.  Cardiovascular:     Rate and Rhythm: Regular rhythm. Bradycardia present. No extrasystoles are present.    Chest Wall: PMI is not displaced.     Pulses: Normal pulses.     Heart sounds: Normal heart sounds. No murmur heard.   No friction rub. No gallop.  Pulmonary:     Effort: Pulmonary effort is normal. No respiratory distress.     Breath sounds: Normal breath sounds. No wheezing, rhonchi or rales.  Musculoskeletal:        General: No swelling. Normal range of motion.     Cervical back: Normal range of motion and neck supple.  Skin:    General: Skin is warm and dry.  Neurological:     General: No focal deficit present.     Mental Status: He is alert and oriented to person, place, and time.     Gait: Gait normal.     Adult ECG Report N/a  Recent Labs:   Reviewed Lab Results  Component Value Date   CHOL 90 (L) 11/05/2020   HDL 49 11/05/2020   LDLCALC 15 11/05/2020   TRIG 161 (H) 11/05/2020   CHOLHDL 1.8 11/05/2020   Lab Results  Component Value Date   CREATININE 1.03 11/05/2020   BUN 12 11/05/2020   NA 142 11/05/2020   K 4.6 11/05/2020   CL 103 11/05/2020   CO2 21 11/05/2020   CBC Latest Ref Rng & Units 10/03/2019 10/02/2019 10/01/2019  WBC 4.0 - 10.5 K/uL 5.9 6.4 9.6  Hemoglobin 13.0 - 17.0 g/dL 14.0 14.3 16.3  Hematocrit  39.0 - 52.0 % 42.5 42.4 48.3  Platelets 150 - 400 K/uL 185 212 218    Lab Results  Component Value Date   HGBA1C 5.6 10/01/2019   Lab Results  Component Value Date   TSH 1.807 10/02/2019    ==================================================  COVID-19 Education: The signs and symptoms of COVID-19 were discussed with the patient and how to seek care for testing (follow up with PCP or arrange E-visit).    I spent a total of 66mnutes with the patient spent in direct patient consultation.  Additional time spent  with chart review  / charting (studies, outside notes, etc): 15 min Total Time: 34 min  Current medicines are reviewed at length with the patient today.  (+/- concerns) n/a  This visit occurred during the SARS-CoV-2 public health emergency.  Safety protocols were in place, including screening questions prior to the visit, additional usage of staff PPE, and extensive cleaning of exam room while observing appropriate contact time as indicated for disinfecting solutions.  Notice: This dictation was prepared with Dragon dictation along with smaller phrase technology. Any transcriptional errors that result from this process are unintentional and may not be corrected upon review.  Patient Instructions / Medication Changes & Studies & Tests Ordered   Patient Instructions  Medication Instructions:  Stop taking Aspirin    Decrease to Brilinta 60 mg twice a day once current bottle og 90 mg is complete  Decrease to taking 20 mg Atorvastatin daily preferable at bedtime   *If you need a refill on your cardiac medications before your next appointment, please call your pharmacy*   Lab Work: In 6 month - March 2023 prior to next office visit Fasting Lipid CMP If you have labs (blood work) drawn today and your tests are completely normal, you will receive your results only by: MyChart Message (if you have MyChart) OR A paper copy in the mail If you have any lab test that is abnormal or we need to change your treatment, we will call you to review the results.   Testing/Procedures: Not needed   Follow-Up: [facility] and [facility] are our [facility].  As part of our continuing mission to provide you with exceptional heart care, we have created designated Provider Care Teams.  These Care Teams include your primary Cardiologist (physician) and Advanced Practice Providers (APPs -  Physician Assistants and Nurse Practitioners) who all work together to provide you with the care you  need, when you need it.     Your next appointment:   6 month(s)  The format for your next appointment:   In Person  Provider:   You will see one of the following Advanced Practice Providers on your designated Care Team:   RRosaria Ferries PA-C JCaron Presume PA-C KJory Sims DNP, ANP CSande RivesNP  Then, DGlenetta Hew MD will plan to see you again in 12 month(s).   Other Instructions    Studies Ordered:   Orders Placed This Encounter  Procedures   Lipid panel   Comprehensive metabolic panel      DGlenetta Hew M.D., M.S. Interventional Cardiologist   Pager # [phone] # [phone] [place]  [address]  [facility] you for choosing [facility]

## 2020-11-23 NOTE — Patient Instructions (Signed)

## 2020-11-27 ENCOUNTER — Other Ambulatory Visit: Payer: Self-pay | Admitting: Cardiology

## 2020-11-27 ENCOUNTER — Other Ambulatory Visit: Payer: Self-pay | Admitting: Medical

## 2020-12-02 ENCOUNTER — Encounter: Payer: Self-pay | Admitting: Cardiology

## 2020-12-02 NOTE — Assessment & Plan Note (Signed)
>>  ASSESSMENT AND PLAN FOR HYPERLIPIDEMIA WITH TARGET LDL LESS THAN 70 WRITTEN ON 12/02/2020 12:35 AM BY [name], [name], MD  Labs still look outstanding on 40 mg of atorvastatin plus Praluent.  This is along with his weight loss and dietary changes.    At this point I think we can reduce atorvastatin to 20 mg daily with LDL at 15 now.  We can recheck in about 6 months.Edward Pierce

## 2020-12-02 NOTE — Assessment & Plan Note (Addendum)
Labs still look outstanding on 40 mg of atorvastatin plus Praluent.  This is along with his weight loss and dietary changes.    At this point I think we can reduce atorvastatin to 20 mg daily with LDL at 15 now.  We can recheck in about 6 months.Edward Pierce

## 2020-12-02 NOTE — Assessment & Plan Note (Signed)
Single-vessel CAD with PCI to the LAD in the setting of anterior STEMI.  Unfortunately, he was late presenting, and resulted in ischemic cardiomyopathy.  Thankfully no recurrent angina or heart failure symptoms.  Plan:  On stable low-dose Toprol and Entresto along with Jardiance.  No change.  With the addition of Praluent, we are able to reduce atorvastatin dose.  Continue to reduce to 20 mg daily.  He is now more than 1 year out from his PCI.  Okay to stop aspirin.  Reduce Brilinta to 60 mg twice daily after current bottle.

## 2020-12-02 NOTE — Assessment & Plan Note (Signed)
He clearly does not really have hypertension, and is barely tolerating low-dose Toprol (12.5 mg daily) and Entresto (24-26 mg twice daily).  Unable to titrate further.

## 2020-12-02 NOTE — Assessment & Plan Note (Signed)
EF now stable at 40 to 45% probably more like 45% with LAD wall motion hypokinetic kinesis.  Thankfully, only having NYHA class I symptoms with minimal exertional dyspnea.  With a conversion of carvedilol  to Toprol  for bradycardia, we and we will start Entresto and Jardiance.  Plan: With his current blood pressure being in the 100s, no room to titrate up Entresto.  Continue low-dose Toprol and Entresto.  No room to titrate further.  Continue Jardiance.  No diuretic requirement.

## 2020-12-02 NOTE — Assessment & Plan Note (Signed)
Has completed 1 year of uninterrupted DAPT-aspirin and Brilinta 90 mg twice daily.  Plan:  Stop aspirin  Reduce Brilinta to 60 mg twice daily at next refill.  Okay to hold Brilinta 5-7 days preop for surgeries or procedures.

## 2020-12-02 NOTE — Assessment & Plan Note (Signed)
In the setting of anterior MI.  He had a PRN sustained VT overnight during stabilization.  No further episodes.  With EF above 40%, LifeVest turned and in no need for ICD. Continue beta-blocker at low-dose.

## 2020-12-02 NOTE — Assessment & Plan Note (Signed)
HFmrEF with EF now 40 to 45%.  NYHA class I symptoms.  On stable low-dose Entresto, Toprol along with Jardiance.  No diuretic requirement.

## 2020-12-14 DIAGNOSIS — K090 Developmental odontogenic cysts: Secondary | ICD-10-CM | POA: Diagnosis not present

## 2020-12-29 ENCOUNTER — Other Ambulatory Visit: Payer: Self-pay

## 2020-12-29 ENCOUNTER — Encounter: Payer: Self-pay | Admitting: Nurse Practitioner

## 2020-12-29 ENCOUNTER — Ambulatory Visit (INDEPENDENT_AMBULATORY_CARE_PROVIDER_SITE_OTHER): Payer: BC Managed Care – PPO | Admitting: Nurse Practitioner

## 2020-12-29 VITALS — BP 112/72 | HR 66 | Temp 98.0°F | Ht 72.0 in | Wt 196.4 lb

## 2020-12-29 DIAGNOSIS — Z0001 Encounter for general adult medical examination with abnormal findings: Secondary | ICD-10-CM | POA: Diagnosis not present

## 2020-12-29 DIAGNOSIS — I2510 Atherosclerotic heart disease of native coronary artery without angina pectoris: Secondary | ICD-10-CM | POA: Diagnosis not present

## 2020-12-29 DIAGNOSIS — Z6826 Body mass index (BMI) 26.0-26.9, adult: Secondary | ICD-10-CM | POA: Insufficient documentation

## 2020-12-29 DIAGNOSIS — I25119 Atherosclerotic heart disease of native coronary artery with unspecified angina pectoris: Secondary | ICD-10-CM | POA: Diagnosis not present

## 2020-12-29 DIAGNOSIS — Z0000 Encounter for general adult medical examination without abnormal findings: Secondary | ICD-10-CM

## 2020-12-29 DIAGNOSIS — Z125 Encounter for screening for malignant neoplasm of prostate: Secondary | ICD-10-CM | POA: Diagnosis not present

## 2020-12-29 NOTE — Patient Instructions (Addendum)
Fat and Cholesterol Restricted Eating Plan Getting too much fat and cholesterol in your diet may cause health problems. Choosing the right foods helps keep your fat and cholesterol at normal levels. This can keep you from getting certain diseases. Your doctor may recommend an eating plan that includes: Total fat: ______% or less of total calories a day. Saturated fat: ______% or less of total calories a day. Cholesterol: less than _________mg a day. Fiber: ______g a day. What are tips for following this plan? Meal planning At meals, divide your plate into four equal parts: Fill one-half of your plate with vegetables and green salads. Fill one-fourth of your plate with whole grains. Fill one-fourth of your plate with low-fat (lean) protein foods. Eat fish that is high in omega-3 fats at least two times a week. This includes mackerel, tuna, sardines, and salmon. Eat foods that are high in fiber, such as whole grains, beans, apples, broccoli, carrots, peas, and barley. General tips  Work with your doctor to lose weight if you need to. Avoid: Foods with added sugar. Fried foods. Foods with partially hydrogenated oils. Limit alcohol intake to no more than 1 drink a day for nonpregnant women and 2 drinks a day for men. One drink equals 12 oz of beer, 5 oz of wine, or 1 oz of hard liquor. Reading food labels Check food labels for: Trans fats. Partially hydrogenated oils. Saturated fat (g) in each serving. Cholesterol (mg) in each serving. Fiber (g) in each serving. Choose foods with healthy fats, such as: Monounsaturated fats. Polyunsaturated fats. Omega-3 fats. Choose grain products that have whole grains. Look for the word "whole" as the first word in the ingredient list. Cooking Cook foods using low-fat methods. These include baking, boiling, grilling, and broiling. Eat more home-cooked foods. Eat at restaurants and buffets less often. Avoid cooking using saturated fats, such as  butter, cream, palm oil, palm kernel oil, and coconut oil. Recommended foods Fruits All fresh, canned (in natural juice), or frozen fruits. Vegetables Fresh or frozen vegetables (raw, steamed, roasted, or grilled). Green salads. Grains Whole grains, such as whole wheat or whole grain breads, crackers, cereals, and pasta. Unsweetened oatmeal, bulgur, barley, quinoa, or brown rice. Corn or whole wheat flour tortillas. Meats and other protein foods Ground beef (85% or leaner), grass-fed beef, or beef trimmed of fat. Skinless chicken or turkey. Ground chicken or turkey. Pork trimmed of fat. All fish and seafood. Egg whites. Dried beans, peas, or lentils. Unsalted nuts or seeds. Unsalted canned beans. Nut butters without added sugar or oil. Dairy Low-fat or nonfat dairy products, such as skim or 1% milk, 2% or reduced-fat cheeses, low-fat and fat-free ricotta or cottage cheese, or plain low-fat and nonfat yogurt. Fats and oils Tub margarine without trans fats. Light or reduced-fat mayonnaise and salad dressings. Avocado. Olive, canola, sesame, or safflower oils. The items listed above may not be a complete list of foods and beverages you can eat. Contact a dietitian for more information. Foods to avoid Fruits Canned fruit in heavy syrup. Fruit in cream or butter sauce. Fried fruit. Vegetables Vegetables cooked in cheese, cream, or butter sauce. Fried vegetables. Grains White bread. White pasta. White rice. Cornbread. Bagels, pastries, and croissants. Crackers and snack foods that contain trans fat and hydrogenated oils. Meats and other protein foods Fatty cuts of meat. Ribs, chicken wings, bacon, sausage, bologna, salami, chitterlings, fatback, hot dogs, bratwurst, and packaged lunch meats. Liver and organ meats. Whole eggs and egg yolks. Chicken and turkey   with skin. Fried meat. Dairy Whole or 2% milk, cream, half-and-half, and cream cheese. Whole milk cheeses. Whole-fat or sweetened yogurt.  Full-fat cheeses. Nondairy creamers and whipped toppings. Processed cheese, cheese spreads, and cheese curds. Beverages Alcohol. Sugar-sweetened drinks such as sodas, lemonade, and fruit drinks. Fats and oils Butter, stick margarine, lard, shortening, ghee, or bacon fat. Coconut, palm kernel, and palm oils. Sweets and desserts Corn syrup, sugars, honey, and molasses. Candy. Jam and jelly. Syrup. Sweetened cereals. Cookies, pies, cakes, donuts, muffins, and ice cream. The items listed above may not be a complete list of foods and beverages you should avoid. Contact a dietitian for more information. Summary Choosing the right foods helps keep your fat and cholesterol at normal levels. This can keep you from getting certain diseases. At meals, fill one-half of your plate with vegetables and green salads. Eat high-fiber foods, like whole grains, beans, apples, carrots, peas, and barley. Limit added sugar, saturated fats, alcohol, and fried foods. This information is not intended to replace advice given to you by your health care provider. Make sure you discuss any questions you have with your health care provider. Document Revised: 07/03/2019 Document Reviewed: 07/03/2019 Elsevier Patient Education  2022 [place].  Aspirin and Your Heart Aspirin is a medicine that prevents the platelets in your blood from sticking together. Platelets are the cells that your blood uses for clotting. Aspirin can be used to help reduce the risk of blood clots, heart attacks, and other heart-related problems. What are the risks? Daily use of aspirin can cause side effects. Some of these include: Bleeding. Bleeding can be minor or serious. An example of minor bleeding is bleeding from a cut, and the bleeding does not stop. An example of more serious bleeding is stomach bleeding or, rarely, bleeding into the brain. Your risk of bleeding increases if you are also taking NSAIDs, such as ibuprofen. Increased  bruising. Upset stomach. An allergic reaction. People who have growths inside the nose (nasal polyps) have an increased risk of developing an aspirin allergy. How to use aspirin to care for your heart  Take aspirin only as told by your health care provider. Make sure that you understand how much to take and what form to take. The two forms of aspirin are: Non-enteric-coated.This type of aspirin does not have a coating and is absorbed quickly. This type of aspirin also comes in a chewable form. Enteric-coated. This type of aspirin has a coating that releases the medicine very slowly. Enteric-coated aspirin might cause less stomach upset than non-enteric-coated aspirin. This type of aspirin should not be chewed or crushed. Work with your health care provider to find out whether it is safe and beneficial for you to take aspirin daily. Taking aspirin daily may be helpful if: You have had a heart attack or chest pain, or you are at risk for a heart attack. You have a condition in which certain heart vessels are blocked (coronary artery disease), and you have had a procedure to treat it. Examples are: Open-heart surgery, such as coronary artery bypass surgery (CABG). Coronary angioplasty,which is done to widen a blood vessel of your heart. Having a small mesh tube, or stent, placed in your coronary artery. You have had certain types of stroke or a mini-stroke known as a transient ischemic attack (TIA). You have a narrowing of the arteries that supply the limbs (peripheral vascular disease, or PVD). You have long-term (chronic) heart rhythm problems, such as atrial fibrillation, and your health care provider  thinks aspirin may help. You have valve disease, have had a heart valve replacement, or have had surgery on a valve. You are considered at increased risk of developing coronary artery disease or PVD. Follow these instructions at home Medicines Take over-the-counter and prescription medicines only  as told by your health care provider. If you are taking blood thinners: Talk with your health care provider before you take any medicines that contain aspirin or NSAIDs, such as ibuprofen. These medicines increase your risk for dangerous bleeding. Take your medicine exactly as told, at the same time every day. Avoid activities that could cause injury or bruising, and follow instructions about how to prevent falls. Wear a medical alert bracelet or carry a card that lists what medicines you take. General instructions Do not drink alcohol if: Your health care provider tells you not to drink. You are pregnant, may be pregnant, or are planning to become pregnant. If you drink alcohol: Limit how much you have to: 0-1 drink a day for women. 0-2 drinks a day for men. Know how much alcohol is in your drink. In the [place] one drink equals one 12 oz bottle of beer (355 mL), one 5 oz glass of wine (148 mL), or one 1 oz glass of hard liquor (44 mL). Keep all follow-up visits. This is important. Where to find more information The American Heart Association: www.heart.org The Centers for Disease Control and Prevention: http://www.wolf.info/ Contact a health care provider if: You have unusual bleeding or bruising. You have stomach pain or you feel nauseous. You have ringing in your ears. You have an allergic reaction that causes hives, itchy skin, or swelling of the lips, tongue, or face. Get help right away if: Your bowel movements are bloody, dark red, or black. You vomit or cough up blood. You have blood in your urine. You have a cough, make high-pitched whistling sounds most often heard when you breathe out (wheeze), or feel short of breath. You have chest pain, especially if the pain spreads to your arms, back, neck, or jaw. You have any symptoms of a stroke. "BE FAST" is an easy way to remember the main warning signs of a stroke: B - Balance. Signs are dizziness, sudden trouble walking, or loss of  balance. E - Eyes. Signs are trouble seeing or a sudden change in vision. F - Face. Signs are sudden weakness or numbness of the face, or the face or eyelid drooping on one side. A - Arms. Signs are weakness or numbness in an arm. This happens suddenly and usually on one side of the body. S - Speech. Signs are sudden trouble speaking, slurred speech, or trouble understanding what people say. T - Time. Time to call emergency services. Write down what time symptoms started. You have other signs of a stroke, such as: A sudden, severe headache with no known cause. Confusion. Nausea or vomiting. Seizure. These symptoms may represent a serious problem that is an emergency. Do not wait to see if the symptoms will go away. Get medical help right away. Call your local emergency services (911 in the [place] Do not drive yourself to the hospital. Summary Aspirin use can help reduce the risk of blood clots, heart attacks, and other heart-related problems. Daily use of aspirin can cause side effects. Take aspirin only as told by your health care provider. Make sure that you understand how much to take and what form to take. Your health care provider will help you determine whether it is safe  and beneficial for you to take aspirin daily. This information is not intended to replace advice given to you by your health care provider. Make sure you discuss any questions you have with your health care provider. Document Revised: 05/02/2020 Document Reviewed: 05/02/2020 Elsevier Patient Education  [place].

## 2020-12-29 NOTE — Progress Notes (Signed)
Established Patient Office Visit  Subjective:  Patient ID: Edward Pierce, male    DOB: 05-21-1965  Age: 55 y.o. MRN: [id number]  CC:  Chief Complaint  Patient presents with   Annual Exam    HPI Edward Pierce presents for annual wellness visit.  He states that he has had some dental work doe over the past month. Doing well. He does have history of CAD. Sees cardiology on routine basis. Most recent visit last month. Sees cardiology approximately every six months to one year. Recent lipid check was good. Does need to have other labs done, including PSA, thyroid panel, and CBC.  He has no concerns or complaints today. He denies chest pain, chest pressure, or shortness of breath. He denies headaches or visual disturbances. He denies abdominal pain, nausea, vomiting, or changes in bowel or bladder habits.    Past Medical History:  Diagnosis Date   Coronary artery disease involving native coronary artery of native heart with angina pectoris ([place]) 10/03/2019   Cardiac Cath-PCI 10/02/2019: Culprit lesion mid LAD 99% heavily thrombotic lesion--> aspiration thrombectomy, DES PCI Synergy 3.0 x 23. 3.3 mm).  Otherwise essentially normal coronaries with RI, LCx and RCA free of significant disease-RCA has mild proximal 20%..  Relatively normal LVEDP 7 mmHg.   History of acute anterior wall MI (did not meet full STEMI criteria) 10/02/2019   associated with AIVR & Sustained VT - ? reperfusion / ischemic arrhythmias   Hyperlipidemia with target LDL less than 70 01/19/2016   Hypertriglyceridemia, familial 01/19/2016   Ischemic cardiomyopathy 10/03/2019   TTE 10/02/2019: (Post anterior MI) EF roughly 35%.  Mid-apical anteroseptal and inferoseptal akinesis.  Apical inferior apical anterior and apical akinesis.  GR 1 DD.     Sustained ventricular tachycardia 10/03/2019   Admitted for ACS-troponin elevation, EKG with borderline anterior STEMI findings.  Had AIVR and sustained VT during stabilization overnight.     Past Surgical History:  Procedure Laterality Date   CARDIAC CATHETERIZATION     CORONARY STENT INTERVENTION N/A 10/02/2019   Procedure: CORONARY STENT INTERVENTION;  Surgeon: Lorretta Harp, MD;  Location: [facility]; Culprit lesion mid LAD 99% heavily thrombotic lesion--> aspiration thrombectomy, DES PCI Synergy 3.0 x 23. 3.3 mm).     CORONARY THROMBECTOMY N/A 10/02/2019   Procedure: Coronary Thrombectomy;  Surgeon: Lorretta Harp, MD;  Location: [facility];  Service: Cardiovascular;; Heavily thrombotic 99% mLAD subTotal occlusion - Aspiration Thrombectomy of Large Red Thrombus   GUM SURGERY  1979   gum graft   HERNIA REPAIR     right side   LEFT HEART CATH AND CORONARY ANGIOGRAPHY N/A 10/02/2019   Procedure: LEFT HEART CATH AND CORONARY ANGIOGRAPHY;  Surgeon: Lorretta Harp, MD;  Location: [facility];  Service: Cardiovascular;; Culprit lesion mid LAD 99% heavily thrombotic lesion--> aspiration thrombectomy, DES PCI.  Otherwise essentially normal coronaries with RI, LCx and RCA free of significant disease-RCA has mild proximal 20%..  Relatively normal LVEDP 7 mmHg.   TRANSTHORACIC ECHOCARDIOGRAM  10/02/2019    (Post anterior MI) EF roughly 35%.  Mid-apical anteroseptal and inferoseptal akinesis.  Apical inferior apical anterior and apical akinesis.  GR 1 DD.  Otherwise normal valves, normal RV and atriae   TRANSTHORACIC ECHOCARDIOGRAM  01/01/2020   EF 40 to 45%.  Mildly reduced LV function with apical akinesis along with anterolateral akinesis.  Normal RV function.  Normal RAP.  Mild aortic dilation -39 mm    Family History  Problem Relation Age of Onset   Cancer Mother        breast   Diabetes Mother    Hypertension Mother    Heart disease Mother        CAD   Breast cancer Mother    Hyperlipidemia Father    COPD Father    Heart disease Father        CAD   Heart attack Paternal Grandfather     Social History   Socioeconomic History   Marital  status: Married    Spouse name: Not on file   Number of children: 2   Years of education: 12   Highest education level: High school graduate  Occupational History    Employer: [employer]    Comment: He works from home  Tobacco Use   Smoking status: Former    Packs/day: 0.25    Years: 13.00    Pack years: 3.25    Types: Cigarettes    Quit date: 02/12/1996    Years since quitting: 24.8   Smokeless tobacco: Never  Substance and Sexual Activity   Alcohol use: Yes    Alcohol/week: 3.0 standard drinks    Types: 1 Glasses of wine, 2 Cans of beer per week   Drug use: No   Sexual activity: Yes    Birth control/protection: None  Other Topics Concern   Not on file  Social History Narrative   Not on file   Social Determinants of Health   Financial Resource Strain: Not on file  Food Insecurity: Not on file  Transportation Needs: Not on file  Physical Activity: Not on file  Stress: Not on file  Social Connections: Not on file  Intimate Partner Violence: Not on file    Outpatient Medications Prior to Visit  Medication Sig Dispense Refill   Alirocumab (PRALUENT) 150 MG/ML SOAJ Inject 150 mg into the skin every 14 (fourteen) days. 2 mL 11   atorvastatin (LIPITOR) 20 MG tablet Take 1 tablet (20 mg total) by mouth at bedtime. 90 tablet 3   JARDIANCE 10 MG TABS tablet TAKE 1 TABLET BY MOUTH DAILY BEFORE BREAKFAST. 90 tablet 3   metoprolol succinate (TOPROL-XL) 25 MG 24 hr tablet TAKE 1/2 TABLET BY MOUTH EVERY DAY 45 tablet 3   nitroGLYCERIN (NITROSTAT) 0.4 MG SL tablet Place 1 tablet under the tongue every 5 minutes as needed for chest pain, up to 3 doses in 15 minutes 25 tablet 2   sacubitril-valsartan (ENTRESTO) 24-26 MG Take 1 tablet of 24/26 mg in the morning and 2 tablets  24/26 mg in the evening 90 tablet 11   ticagrelor (BRILINTA) 60 MG TABS tablet Take 1 tablet (60 mg total) by mouth 2 (two) times daily. 180 tablet 3   No facility-administered medications prior to visit.    No Known  Allergies  ROS Review of Systems  Constitutional:  Negative for activity change, chills, fatigue and fever.  HENT:  Negative for congestion, postnasal drip, rhinorrhea, sinus pressure, sinus pain, sneezing and sore throat.   Eyes: Negative.   Respiratory:  Negative for cough, shortness of breath and wheezing.   Cardiovascular:  Negative for chest pain and palpitations.       Histlory of CAD. Sees cardiollogy on routine basis. He has recetnly had good check ups.   Gastrointestinal:  Negative for constipation, diarrhea, nausea and vomiting.  Endocrine: Negative for cold intolerance, heat intolerance, polydipsia and polyuria.  Genitourinary:  Negative for dysuria, frequency and urgency.  Musculoskeletal:  Negative  for back pain and myalgias.  Skin:  Negative for rash.  Allergic/Immunologic: Negative for environmental allergies.  Neurological:  Negative for dizziness, weakness and headaches.  Psychiatric/Behavioral:  The patient is not nervous/anxious.      Objective:    Physical Exam Vitals and nursing note reviewed.  Constitutional:      Appearance: Normal appearance. He is well-developed.  HENT:     Head: Normocephalic and atraumatic.     Right Ear: Tympanic membrane, ear canal and external ear normal.     Left Ear: Tympanic membrane, ear canal and external ear normal.     Ears:     Comments: Cerumen presnt in both outer ear canals without obstruction.     Nose: Nose normal.     Mouth/Throat:     Mouth: Mucous membranes are moist.     Pharynx: Oropharynx is clear.  Eyes:     Extraocular Movements: Extraocular movements intact.     Conjunctiva/sclera: Conjunctivae normal.     Pupils: Pupils are equal, round, and reactive to light.  Cardiovascular:     Rate and Rhythm: Normal rate and regular rhythm.     Pulses: Normal pulses.     Heart sounds: Normal heart sounds.  Pulmonary:     Effort: Pulmonary effort is normal.     Breath sounds: Normal breath sounds.  Abdominal:      General: Bowel sounds are normal. There is no distension.     Palpations: Abdomen is soft. There is no mass.     Tenderness: There is no abdominal tenderness. There is no guarding or rebound.     Hernia: No hernia is present.  Musculoskeletal:        General: Normal range of motion.     Cervical back: Normal range of motion and neck supple.  Lymphadenopathy:     Cervical: No cervical adenopathy.  Skin:    General: Skin is warm and dry.     Capillary Refill: Capillary refill takes less than 2 seconds.  Neurological:     General: No focal deficit present.     Mental Status: He is alert and oriented to person, place, and time.  Psychiatric:        Mood and Affect: Mood normal.        Behavior: Behavior normal.        Thought Content: Thought content normal.        Judgment: Judgment normal.   Today's Vitals   12/29/20 0934  BP: 112/72  Pulse: 66  Temp: 98 F (36.7 C)  SpO2: 98%  Weight: 196 lb 6.4 oz (89.1 kg)  Height: 6' (1.829 m)   Body mass index is 26.64 kg/m.   Wt Readings from Last 3 Encounters:  12/29/20 196 lb 6.4 oz (89.1 kg)  11/18/20 194 lb 3.2 oz (88.1 kg)  11/17/20 193 lb 6.4 oz (87.7 kg)     Health Maintenance Due  Topic Date Due   Hepatitis C Screening  Never done   Zoster Vaccines- Shingrix (1 of 2) Never done   TETANUS/TDAP  08/21/2019   COVID-19 Vaccine (3 - Pfizer risk series) 12/31/2019    There are no preventive care reminders to display for this patient.  Lab Results  Component Value Date   TSH 1.807 10/02/2019   Lab Results  Component Value Date   WBC 5.9 10/03/2019   HGB 14.0 10/03/2019   HCT 42.5 10/03/2019   MCV 88.9 10/03/2019   PLT 185 10/03/2019   Lab Results  Component Value Date   NA 142 11/05/2020   K 4.6 11/05/2020   CO2 21 11/05/2020   GLUCOSE 99 11/05/2020   BUN 12 11/05/2020   CREATININE 1.03 11/05/2020   BILITOT 0.8 11/05/2020   ALKPHOS 87 11/05/2020   AST 27 11/05/2020   ALT 25 11/05/2020   PROT 7.0  11/05/2020   ALBUMIN 5.0 (H) 11/05/2020   CALCIUM 9.6 11/05/2020   ANIONGAP 9 10/03/2019   EGFR 86 11/05/2020   Lab Results  Component Value Date   CHOL 90 (L) 11/05/2020   Lab Results  Component Value Date   HDL 49 11/05/2020   Lab Results  Component Value Date   LDLCALC 15 11/05/2020   Lab Results  Component Value Date   TRIG 161 (H) 11/05/2020   Lab Results  Component Value Date   CHOLHDL 1.8 11/05/2020   Lab Results  Component Value Date   HGBA1C 5.6 10/01/2019      Assessment & Plan:  1. Encounter for general adult medical examination with abnormal findings Annual wellness visit today  2. Atherosclerosis of native coronary artery of native heart without angina pectoris Patient sees cardiology on routine basis.   3. Body mass index 26.0-26.9, adult Encourage patient to limit calorie intake to 2000 cal/day or less.  He should consume a low cholesterol, low-fat diet.  Patient incorporate exercise into his daily routine.   4. Screening for prostate cancer Psa drawn during today's visit  - PSA  5. Healthcare maintenance Cbc and TSH drawn during today's visit  - CBC with Differential/Platelet - TSH   Problem List Items Addressed This Visit       Cardiovascular and Mediastinum   Atherosclerosis of native coronary artery of native heart without angina pectoris     Other   Screening for prostate cancer   Relevant Orders   PSA   Body mass index 26.0-26.9, adult   Other Visit Diagnoses     Encounter for general adult medical examination with abnormal findings    -  Primary   Healthcare maintenance       Relevant Orders   CBC with Differential/Platelet   TSH        Follow-up: No follow-ups on file.    Ronnell Freshwater, NP

## 2020-12-30 ENCOUNTER — Encounter: Payer: Self-pay | Admitting: Nurse Practitioner

## 2020-12-30 LAB — CBC WITH DIFFERENTIAL/PLATELET
Basophils Absolute: 0 10*3/uL (ref 0.0–0.2)
Basos: 1 %
EOS (ABSOLUTE): 0.1 10*3/uL (ref 0.0–0.4)
Eos: 2 %
Hematocrit: 47.5 % (ref 37.5–51.0)
Hemoglobin: 15.8 g/dL (ref 13.0–17.7)
Immature Grans (Abs): 0 10*3/uL (ref 0.0–0.1)
Immature Granulocytes: 0 %
Lymphocytes Absolute: 1.4 10*3/uL (ref 0.7–3.1)
Lymphs: 27 %
MCH: 30.3 pg (ref 26.6–33.0)
MCHC: 33.3 g/dL (ref 31.5–35.7)
MCV: 91 fL (ref 79–97)
Monocytes Absolute: 0.3 10*3/uL (ref 0.1–0.9)
Monocytes: 6 %
Neutrophils Absolute: 3.3 10*3/uL (ref 1.4–7.0)
Neutrophils: 64 %
Platelets: 213 10*3/uL (ref 150–450)
RBC: 5.22 x10E6/uL (ref 4.14–5.80)
RDW: 12.8 % (ref 11.6–15.4)
WBC: 5.1 10*3/uL (ref 3.4–10.8)

## 2020-12-30 LAB — PSA: Prostate Specific Ag, Serum: 0.9 ng/mL (ref 0.0–4.0)

## 2020-12-30 LAB — TSH: TSH: 1.39 u[IU]/mL (ref 0.450–4.500)

## 2020-12-30 NOTE — Progress Notes (Signed)
Labs good. MyChart message sent to patient.

## 2021-01-27 ENCOUNTER — Other Ambulatory Visit: Payer: Self-pay | Admitting: Medical

## 2021-01-27 NOTE — Telephone Encounter (Signed)
This is Dr. [name]'s pt. °

## 2021-02-21 ENCOUNTER — Other Ambulatory Visit: Payer: Self-pay | Admitting: Cardiology

## 2021-02-21 DIAGNOSIS — E785 Hyperlipidemia, unspecified: Secondary | ICD-10-CM

## 2021-02-21 DIAGNOSIS — I2510 Atherosclerotic heart disease of native coronary artery without angina pectoris: Secondary | ICD-10-CM

## 2021-03-11 ENCOUNTER — Other Ambulatory Visit: Payer: Self-pay | Admitting: Cardiology

## 2021-03-11 DIAGNOSIS — I2510 Atherosclerotic heart disease of native coronary artery without angina pectoris: Secondary | ICD-10-CM

## 2021-03-11 DIAGNOSIS — E785 Hyperlipidemia, unspecified: Secondary | ICD-10-CM

## 2021-03-18 ENCOUNTER — Encounter: Payer: Self-pay | Admitting: Cardiology

## 2021-03-27 NOTE — Telephone Encounter (Signed)
Yes - pls refill the Rx for Praluent.  Glenetta Hew, MD

## 2021-05-19 ENCOUNTER — Other Ambulatory Visit: Payer: 59

## 2021-05-19 ENCOUNTER — Other Ambulatory Visit: Payer: Self-pay

## 2021-05-19 DIAGNOSIS — I255 Ischemic cardiomyopathy: Secondary | ICD-10-CM | POA: Diagnosis not present

## 2021-05-19 DIAGNOSIS — E785 Hyperlipidemia, unspecified: Secondary | ICD-10-CM | POA: Diagnosis not present

## 2021-05-19 DIAGNOSIS — I25119 Atherosclerotic heart disease of native coronary artery with unspecified angina pectoris: Secondary | ICD-10-CM | POA: Diagnosis not present

## 2021-05-19 LAB — COMPREHENSIVE METABOLIC PANEL
ALT: 17 IU/L (ref 0–44)
AST: 25 IU/L (ref 0–40)
Albumin/Globulin Ratio: 2.4 — ABNORMAL HIGH (ref 1.2–2.2)
Albumin: 4.7 g/dL (ref 3.8–4.9)
Alkaline Phosphatase: 77 IU/L (ref 44–121)
BUN/Creatinine Ratio: 17 (ref 9–20)
BUN: 14 mg/dL (ref 6–24)
Bilirubin Total: 0.7 mg/dL (ref 0.0–1.2)
CO2: 26 mmol/L (ref 20–29)
Calcium: 9.3 mg/dL (ref 8.7–10.2)
Chloride: 105 mmol/L (ref 96–106)
Creatinine, Ser: 0.84 mg/dL (ref 0.76–1.27)
Globulin, Total: 2 g/dL (ref 1.5–4.5)
Glucose: 95 mg/dL (ref 70–99)
Potassium: 4.2 mmol/L (ref 3.5–5.2)
Sodium: 143 mmol/L (ref 134–144)
Total Protein: 6.7 g/dL (ref 6.0–8.5)
eGFR: 103 mL/min/{1.73_m2} (ref >59)

## 2021-05-19 LAB — LIPID PANEL
Chol/HDL Ratio: 1.8 ratio (ref 0.0–5.0)
Cholesterol, Total: 90 mg/dL — ABNORMAL LOW (ref 100–199)
HDL: 49 mg/dL (ref >39)
LDL Chol Calc (NIH): 19 mg/dL (ref 0–99)
Triglycerides: 125 mg/dL (ref 0–149)
VLDL Cholesterol Cal: 22 mg/dL (ref 5–40)

## 2021-06-03 ENCOUNTER — Encounter: Payer: Self-pay | Admitting: Nurse Practitioner

## 2021-06-03 ENCOUNTER — Other Ambulatory Visit: Payer: Self-pay

## 2021-06-03 ENCOUNTER — Ambulatory Visit (INDEPENDENT_AMBULATORY_CARE_PROVIDER_SITE_OTHER): Payer: 59 | Admitting: Nurse Practitioner

## 2021-06-03 VITALS — BP 92/60 | HR 61 | Ht 72.0 in | Wt 195.4 lb

## 2021-06-03 DIAGNOSIS — I5022 Chronic systolic (congestive) heart failure: Secondary | ICD-10-CM | POA: Diagnosis not present

## 2021-06-03 DIAGNOSIS — I2510 Atherosclerotic heart disease of native coronary artery without angina pectoris: Secondary | ICD-10-CM

## 2021-06-03 DIAGNOSIS — E785 Hyperlipidemia, unspecified: Secondary | ICD-10-CM

## 2021-06-03 DIAGNOSIS — I255 Ischemic cardiomyopathy: Secondary | ICD-10-CM | POA: Diagnosis not present

## 2021-06-03 DIAGNOSIS — I472 Ventricular tachycardia, unspecified: Secondary | ICD-10-CM | POA: Diagnosis not present

## 2021-06-03 NOTE — Progress Notes (Addendum)
Office Visit    Patient Name: Edward Pierce Date of Encounter: 06/03/2021  Primary Care Provider:  Carlean Jews, NP Primary Cardiologist:  Bryan Lemma, MD  Chief Complaint    56 year old male with a history of CAD, chronic systolic heart failure, ICM, SVT, and hyperlipidemia who presents for follow-up related to CAD and heart failure.  Past Medical History    Past Medical History:  Diagnosis Date   Coronary artery disease involving native coronary artery of native heart with angina pectoris (HCC) 10/03/2019   Cardiac Cath-PCI 10/02/2019: Culprit lesion mid LAD 99% heavily thrombotic lesion--> aspiration thrombectomy, DES PCI Synergy 3.0 x 23. 3.3 mm).  Otherwise essentially normal coronaries with RI, LCx and RCA free of significant disease-RCA has mild proximal 20%..  Relatively normal LVEDP 7 mmHg.   History of acute anterior wall MI (did not meet full STEMI criteria) 10/02/2019   associated with AIVR & Sustained VT - ? reperfusion / ischemic arrhythmias   Hyperlipidemia with target LDL less than 70 01/19/2016   Hypertriglyceridemia, familial 01/19/2016   Ischemic cardiomyopathy 10/03/2019   TTE 10/02/2019: (Post anterior MI) EF roughly 35%.  Mid-apical anteroseptal and inferoseptal akinesis.  Apical inferior apical anterior and apical akinesis.  GR 1 DD.     Sustained ventricular tachycardia 10/03/2019   Admitted for ACS-troponin elevation, EKG with borderline anterior STEMI findings.  Had AIVR and sustained VT during stabilization overnight.   Past Surgical History:  Procedure Laterality Date   CARDIAC CATHETERIZATION     CORONARY STENT INTERVENTION N/A 10/02/2019   Procedure: CORONARY STENT INTERVENTION;  Surgeon: Runell Gess, MD;  Location: [facility] INVASIVE CV [facility]; Culprit lesion mid LAD 99% heavily thrombotic lesion--> aspiration thrombectomy, DES PCI Synergy 3.0 x 23. 3.3 mm).     CORONARY THROMBECTOMY N/A 10/02/2019   Procedure: Coronary Thrombectomy;  Surgeon: Runell Gess, MD;  Location: [facility] INVASIVE CV [facility];  Service: Cardiovascular;; Heavily thrombotic 99% mLAD subTotal occlusion - Aspiration Thrombectomy of Large Red Thrombus   GUM SURGERY  1979   gum graft   HERNIA REPAIR     right side   LEFT HEART CATH AND CORONARY ANGIOGRAPHY N/A 10/02/2019   Procedure: LEFT HEART CATH AND CORONARY ANGIOGRAPHY;  Surgeon: Runell Gess, MD;  Location: [facility] INVASIVE CV [facility];  Service: Cardiovascular;; Culprit lesion mid LAD 99% heavily thrombotic lesion--> aspiration thrombectomy, DES PCI.  Otherwise essentially normal coronaries with RI, LCx and RCA free of significant disease-RCA has mild proximal 20%..  Relatively normal LVEDP 7 mmHg.   TRANSTHORACIC ECHOCARDIOGRAM  10/02/2019    (Post anterior MI) EF roughly 35%.  Mid-apical anteroseptal and inferoseptal akinesis.  Apical inferior apical anterior and apical akinesis.  GR 1 DD.  Otherwise normal valves, normal RV and atriae   TRANSTHORACIC ECHOCARDIOGRAM  01/01/2020   EF 40 to 45%.  Mildly reduced LV function with apical akinesis along with anterolateral akinesis.  Normal RV function.  Normal RAP.  Mild aortic dilation -39 mm    Allergies  No Known Allergies  History of Present Illness    56 year old male with a history of CAD, chronic systolic heart failure, ICM, SVT, and hyperlipidemia.  He was hospitalized in July 2021 in the setting of NSTEMI.  Catheterization showed mLAD 99-0% s/p DES.  He was started on DAPT with aspirin and Brilinta, now on maintenance dose of Brilinta.  Echocardiogram at the time showed EF 35%, moderately decreased LV function, RWMA including mid to apical anteroseptal and inferoseptal akinesis,  akinetic true apex, no evidence of LV thrombus, G1 DD, no significant valvular abnormalities.  SVT in the setting of anterior MI.  He was discharged home with a LifeVest.  Repeat echocardiogram in October 2021 showed EF improved to 40 to 45%, mild concentric LVH, akinesis of the LV apical segment,  mid anteroseptal wall, no significant valvular abnormalities. He last saw Dr. Herbie Baltimore in the office on 11/18/2020 and was stable from a cardiac standpoint.  Escalation of GDMT was limited in the setting of lower BP.  He presents today for follow-up. Since his last visit stable from a cardiac standpoint.  He does have lower BP and reports occasional mild dizziness with position changes, he denies presyncope, syncope. He states some days he feels a little tired.  He denies symptoms concerning for angina.  Overall, he is active and reports feeling well, denies any specific concerns or complaints today.   Home Medications    Current Outpatient Medications  Medication Sig Dispense Refill   acetaminophen (TYLENOL) 500 MG tablet Take 500 mg by mouth every 6 (six) hours as needed. Takes as needed.     JARDIANCE 10 MG TABS tablet TAKE 1 TABLET BY MOUTH DAILY BEFORE BREAKFAST. 90 tablet 3   loratadine (CLARITIN) 10 MG tablet Take 10 mg by mouth daily as needed for allergies.     metoprolol succinate (TOPROL-XL) 25 MG 24 hr tablet TAKE 1/2 TABLET BY MOUTH EVERY DAY 45 tablet 3   nitroGLYCERIN (NITROSTAT) 0.4 MG SL tablet Place 1 tablet under the tongue every 5 minutes as needed for chest pain, up to 3 doses in 15 minutes 25 tablet 2   PRALUENT 150 MG/ML SOAJ INJECT 150 MG INTO THE SKIN EVERY 14 (FOURTEEN) DAYS. 6 mL 3   sacubitril-valsartan (ENTRESTO) 24-26 MG TAKE 1 TABLET BY MOUTH TWICE A DAY (Patient taking differently: 2 tablets 2 (two) times daily. TAKE 1 (ONE) TABLET BY MOUTH IN MORNING AND 2 (TWO) TABLETS BY MOUTH AT BEDTIME.) 180 tablet 1   ticagrelor (BRILINTA) 60 MG TABS tablet Take 1 tablet (60 mg total) by mouth 2 (two) times daily. 180 tablet 3   atorvastatin (LIPITOR) 20 MG tablet Take 1 tablet (20 mg total) by mouth at bedtime. 90 tablet 3   No current facility-administered medications for this visit.     Review of Systems    He denies chest pain, palpitations, dyspnea, pnd, orthopnea, n, v,  syncope, edema, weight gain, or early satiety. All other systems reviewed and are otherwise negative except as noted above.   Physical Exam    VS:  BP 92/60 (BP Location: Left Arm, Patient Position: Sitting, Cuff Size: Normal)   Pulse 61   Ht 6' (1.829 m)   Wt 195 lb 6.4 oz (88.6 kg)   SpO2 96%   BMI 26.50 kg/m   GEN: Well nourished, well developed, in no acute distress. HEENT: normal. Neck: Supple, no JVD, carotid bruits, or masses. Cardiac: RRR, no murmurs, rubs, or gallops. No clubbing, cyanosis, edema.  Radials/DP/PT 2+ and equal bilaterally.  Respiratory:  Respirations regular and unlabored, clear to auscultation bilaterally. GI: Soft, nontender, nondistended, BS + x 4. MS: no deformity or atrophy. Skin: warm and dry, no rash. Neuro:  Strength and sensation are intact. Psych: Normal affect.  Accessory Clinical Findings    ECG personally reviewed by me today -no EKG in office today- no acute changes.  Lab Results  Component Value Date   WBC 5.1 12/29/2020   HGB 15.8 12/29/2020  HCT 47.5 12/29/2020   MCV 91 12/29/2020   PLT 213 12/29/2020   Lab Results  Component Value Date   CREATININE 0.84 05/19/2021   BUN 14 05/19/2021   NA 143 05/19/2021   K 4.2 05/19/2021   CL 105 05/19/2021   CO2 26 05/19/2021   Lab Results  Component Value Date   ALT 17 05/19/2021   AST 25 05/19/2021   ALKPHOS 77 05/19/2021   BILITOT 0.7 05/19/2021   Lab Results  Component Value Date   CHOL 90 (L) 05/19/2021   HDL 49 05/19/2021   LDLCALC 19 05/19/2021   TRIG 125 05/19/2021   CHOLHDL 1.8 05/19/2021    Lab Results  Component Value Date   HGBA1C 5.6 10/01/2019    Assessment & Plan    1. CAD: S/p DES-mLAD in 09/2019. Stable with no anginal symptoms. No indication for ischemic evaluation.  Continue maintenance dose Brilinta, metoprolol, Jardiance, Entresto, Praluent and Lipitor.  2. Chronic systolic heart failure/ICM: Prior EF 35% in the setting of NSTEMI.  Repeat echo in  12/2019 showed EF improved to 40 to 45%, mild concentric LVH, akinesis of the LV apical segment, mid anteroseptal wall, no significant valvular abnormalities. Euvolemic and well compensated on exam. BP is ow, he is taking 1 tablet of Entresto 24-26 mg in the am and 2 tablets in the pm. He does have some mild orthostatic dizziness, feels tired some days. Will discuss with Dr. Herbie Baltimore, primary cardiologist, potential reduction of Entresto to 24-26 bid given low BP.   Addendum 06/04/2021: Per review with Dr. Herbie Baltimore, will reduce Entresto to 24-26 mg bid given low BP.   3. H/o sustained VT: Occurred in the setting of acute MI.  He states he has rare flutters in his heart that last only for a few seconds. Stable on low-dose beta-blocker therapy.   4. Hyperlipidemia: LDL was 19 in March 2023.  Continue Praluent, Lipitor.  5.Disposition: Follow-up with Dr. Herbie Baltimore in 6 months.  Joylene Grapes, NP 06/03/2021, 3:43 PM

## 2021-06-03 NOTE — Patient Instructions (Signed)
Medication Instructions:  ?No Changes ?*If you need a refill on your cardiac medications before your next appointment, please call your pharmacy* ? ? ?Lab Work: ?No Labs ?If you have labs (blood work) drawn today and your tests are completely normal, you will receive your results only by: ?MyChart Message (if you have MyChart) OR ?A paper copy in the mail ?If you have any lab test that is abnormal or we need to change your treatment, we will call you to review the results. ? ? ?Testing/Procedures: ?No Testing ? ? ?Follow-Up: ?At [facility], you and your health needs are our priority.  As part of our continuing mission to provide you with exceptional heart care, we have created designated Provider Care Teams.  These Care Teams include your primary Cardiologist (physician) and Advanced Practice Providers (APPs -  Physician Assistants and Nurse Practitioners) who all work together to provide you with the care you need, when you need it. ? ?We recommend signing up for the patient portal called "MyChart".  Sign up information is provided on this After Visit Summary.  MyChart is used to connect with patients for Virtual Visits (Telemedicine).  Patients are able to view lab/test results, encounter notes, upcoming appointments, etc.  Non-urgent messages can be sent to your provider as well.   ?To learn more about what you can do with MyChart, go to [email or web address].   ? ?Your next appointment:   ?September 2023 ? ?The format for your next appointment:   ?In Person ? ?Provider:   ?Glenetta Hew, MD   ? ? ?  ?

## 2021-06-15 ENCOUNTER — Other Ambulatory Visit: Payer: Self-pay | Admitting: Cardiology

## 2021-06-18 ENCOUNTER — Other Ambulatory Visit: Payer: Self-pay | Admitting: Cardiology

## 2021-06-18 DIAGNOSIS — E785 Hyperlipidemia, unspecified: Secondary | ICD-10-CM

## 2021-06-21 MED ORDER — ATORVASTATIN CALCIUM 20 MG PO TABS: 20.0000 mg | ORAL_TABLET | Freq: Every day | ORAL | 3 refills | Status: DC

## 2021-06-21 NOTE — Telephone Encounter (Signed)
Rx(s) sent to pharmacy electronically.  

## 2021-07-20 ENCOUNTER — Encounter: Payer: Self-pay | Admitting: Cardiology

## 2021-09-17 MED ORDER — REPATHA SURECLICK 140 MG/ML ~~LOC~~ SOAJ: 1.0000 | SUBCUTANEOUS | 3 refills | Status: DC

## 2021-09-27 ENCOUNTER — Encounter: Payer: Self-pay | Admitting: Cardiology

## 2021-09-28 NOTE — Telephone Encounter (Signed)
The answers that question really has to do with how much he is physically capable of doing.  Strictly speaking, 2 years after STEMI he should be able to do what ever he wants to do.  The question is what is he physically capable of doing.  [place]

## 2021-10-22 ENCOUNTER — Other Ambulatory Visit: Payer: Self-pay | Admitting: Cardiology

## 2021-10-22 DIAGNOSIS — I255 Ischemic cardiomyopathy: Secondary | ICD-10-CM

## 2021-10-22 DIAGNOSIS — I25119 Atherosclerotic heart disease of native coronary artery with unspecified angina pectoris: Secondary | ICD-10-CM

## 2021-10-22 DIAGNOSIS — Z955 Presence of coronary angioplasty implant and graft: Secondary | ICD-10-CM

## 2021-11-17 ENCOUNTER — Encounter: Payer: Self-pay | Admitting: Cardiology

## 2021-11-22 ENCOUNTER — Encounter: Payer: Self-pay | Admitting: Cardiology

## 2021-11-22 ENCOUNTER — Ambulatory Visit: Payer: 59 | Attending: Cardiology | Admitting: Cardiology

## 2021-11-22 VITALS — BP 110/76 | HR 55 | Ht 72.0 in | Wt 199.0 lb

## 2021-11-22 DIAGNOSIS — I255 Ischemic cardiomyopathy: Secondary | ICD-10-CM | POA: Diagnosis not present

## 2021-11-22 DIAGNOSIS — I252 Old myocardial infarction: Secondary | ICD-10-CM

## 2021-11-22 DIAGNOSIS — I2510 Atherosclerotic heart disease of native coronary artery without angina pectoris: Secondary | ICD-10-CM | POA: Diagnosis not present

## 2021-11-22 DIAGNOSIS — R030 Elevated blood-pressure reading, without diagnosis of hypertension: Secondary | ICD-10-CM

## 2021-11-22 DIAGNOSIS — I5020 Unspecified systolic (congestive) heart failure: Secondary | ICD-10-CM | POA: Diagnosis not present

## 2021-11-22 DIAGNOSIS — Z955 Presence of coronary angioplasty implant and graft: Secondary | ICD-10-CM

## 2021-11-22 DIAGNOSIS — E785 Hyperlipidemia, unspecified: Secondary | ICD-10-CM | POA: Diagnosis not present

## 2021-11-22 DIAGNOSIS — I472 Ventricular tachycardia, unspecified: Secondary | ICD-10-CM

## 2021-11-22 NOTE — Patient Instructions (Addendum)
Medication Instruction  Continue medication except do a one month statin "holiday "-   stop taking Atorvastatin If symptoms get better restart at 1/2 tablet daily , if does get better restart at regular dose  atrovastatin   *If you need a refill on your cardiac medications before your next appointment, please call your pharmacy*   Lab Work:  Not needed    Testing/Procedures:  Not needed  Follow-Up: At [facility], you and your health needs are our priority.  As part of our continuing mission to provide you with exceptional heart care, we have created designated Provider Care Teams.  These Care Teams include your primary Cardiologist (physician) and Advanced Practice Providers (APPs -  Physician Assistants and Nurse Practitioners) who all work together to provide you with the care you need, when you need it.  We recommend signing up for the patient portal called "MyChart".  Sign up information is provided on this After Visit Summary.  MyChart is used to connect with patients for Virtual Visits (Telemedicine).  Patients are able to view lab/test results, encounter notes, upcoming appointments, etc.  Non-urgent messages can be sent to your provider as well.   To learn more about what you can do with MyChart, go to [email or web address].    Your next appointment:   12 month(s)  The format for your next appointment:   In Person  Provider:   Glenetta Hew, MD    Other Instructions

## 2021-11-22 NOTE — Progress Notes (Signed)
Primary Care Provider: Ronnell Freshwater, NP [place] cardiologist: Glenetta Hew, MD Electrophysiologist: None  Clinic Note: Chief Complaint  Patient presents with   Follow-up    44-month(2 years out from MI)   Coronary Artery Disease    No active angina symptoms.    ===================================  ASSESSMENT/PLAN   Problem List Items Addressed This Visit       Cardiology Problems   Atherosclerosis of native coronary artery of native heart without angina pectoris (Chronic)    He has single-vessel disease in the LAD that was treated with stent.  A good portion of the ostial LAD distribution unfortunately was infarcted, but he is not having any further angina otherwise.  Plan: Continue low-dose beta-blocker and max dose Entresto along with Jardiance for CAD and HFrEF Continue Repatha and moderate dose statin for lipid control Remains on maintenance dose of Brilinta 60 mg twice daily.  He is not 2 years out, because report to the LAD lesion, we both agree that we would prefer her to stay on maintenance dose Thienopyridine.  I talked about potentially switching to look Adderall, but he would prefer to stay on Brilinta. Okay to hold Brilinta 5 to 7 days preop for surgeries or procedures.      HFrEF (heart failure with reduced ejection fraction) (HCC) (Chronic)    EF thankfully up to 40-45%.  Echo does show LAD distribution wall motion abnormality which does make sense with the fact that he was having ongoing ischemia and NSVT at the time of presentation.  Thankfully, he has pretty much recovered fully:   NYHA Class I Symptoms.  Plan:  Continue max dose of Entresto 24/26 mg: 1 tab p.o. a.m., 2 tabs p.m. (this may be difference for his dizziness) Continue low-dose Toprol at 12.5 mg daily.  Cannot push any further bradycardia 55 bpm at rest. He is also on Jardiance 10 mg daily not requiring any diuretic       Ischemic cardiomyopathy (Chronic)    Some overall  improvement of EF of 30 to 35% up to 40 to 45% but the LAD wall does appear to be infarcted.  He is on good stable regimen with NYHA Class I symptoms, euvolemic without diuretic. On max tolerable doses of Entresto and Toprol along with Jardiance 10 mg      Relevant Orders   EKG 12-Lead (Completed)   Sustained ventricular tachycardia (HCC)    In the setting of ischemia.  Remains on low-dose beta-blocker but no recurrent episodes.  We need to continue to monitor as he clearly has some evidence of scar on echo.  Only able to tolerate low-dose beta-blocker due to bradycardia..      Hyperlipidemia with target LDL less than 70 (Chronic)    We dropped his atorvastatin down to 20 mg because of Myalgia symptoms.  Feeling much better on reduced dose. Had been [place]. LDL 19. Excellent control.  Monitor for potential recurrence of myalgias with statin and reduce dose again if necessary.         Other   History of acute anterior wall MI (did not meet full STEMI criteria) (Chronic)    History of enlarged heart with pretty significant LAD infarct on Echo but some improvement in EF up to 40 to 45%..  Thankfully, despite having ischemic cardiomyopathy he has only class I symptoms of HFrEF.  No further angina.      Presence of drug coated stent in LAD coronary  artery (Chronic)    He has not completed his second year of Thienopyridine coverage.  Per discussion with patient, he would prefer to stay on Brilinta at maintenance dose and not switch to Plavix versus simply go back to aspirin.  We will continue maintenance dose Brilinta 60 mg twice daily  Okay to hold Brilinta 5 to 7 days preop for surgeries or procedures.      Elevated blood pressure reading without diagnosis of hypertension (Chronic)    8 truthfully really does not have hypertension.  Not able to tolerate very significant doses of Entresto and Toprol.  Would not titrate medications further  because of his prior issues with orthostatic hypotension and near syncope.      Other Visit Diagnoses     Coronary artery disease involving native coronary artery of native heart without angina pectoris    -  Primary   Relevant Orders   EKG 12-Lead (Completed)      ===================================  HPI:    Edward Pierce is a 56 y.o. male with a PMH notable for CAD (non-STEMI with severe LAD disease with ischemic NSVT =>DES PCI) and stable ICM/NYHA Class I-II CHF (HFrEF, partially improved EF 40 to 45%), along with HTN, HLD who presents today for 61-monthfollow-up.  Cardiac History: July 2021 in the setting of NSTEMI.  NSVT in the setting of anterior MI. CATH-PCI: mLAD 99-0% s/p DES.  =  After 1 year DAPT, on maintenance Brilinta 60 mg twice daily.   Echo in setting of non-STEMI:  EF 35%, mid to apical anteroseptal and inferoseptal akinesis, akinetic true apex, no evidence of LV thrombus, G1 DD, no significant valvular abnormalities.     D/c with LifeVest.   Repeat echo in October 2021: EF improved to 40 to 45%, mid anteroseptal and apical akinesis consistent with LAD infarct.  Edward NEELYwas last seen on 06/03/2021 by EDiona Browner NP indicated that he was stable over the last 6 months from being seen in September.  BP was somewhat low with sensitivity to position change (orthostatic).  No syncope or near-syncope.  Maybe little tired.  No active angina or heart failure.  Reduced daytime dose of Entresto to 1 tab 24/26 mg and continued 2 tablets of the 24/26 mg nightly Because of insurance issues, he is [place]  Recent Hospitalizations: None  Reviewed  CV studies:    The following studies were reviewed today: (if available, images/films reviewed: From Epic Chart or Care Everywhere) None:  Interval History:   Edward MAZARIEGOreturns here today for 624-monthollow-up doing pretty well.   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  Thankfully, this never came to fruition as the discussion between the employees and management over salaries at, etc. were favorable.  He was a little bit concerned about being on tolerate the high heat in the back of a truck.  But otherwise he has not had any concerning cardiac symptoms.  He has not had any chest pain or pressure at rest or exertion.  No palpitations.  No  PND, orthopnea or edema. The dizziness is notably better since decreasing his Entresto dose in the morning.  He has not had any further lightheadedness or dizziness except for days when he is certain that he overheated.  CV Review of Symptoms (Summary): no chest pain or dyspnea on exertion positive for - only some lightheadedness and dizziness in the extreme heat if he has not been hydrating well enough.  Otherwise much better with reduced dose of Entresto. negative for - edema, irregular heartbeat, orthopnea, palpitations, paroxysmal nocturnal dyspnea, rapid heart rate, shortness of breath, or syncope/near syncope or TIA/amaurosis fugax claudication  REVIEWED OF SYSTEMS   Review of Systems  Constitutional:  Negative for malaise/fatigue (Energy doing well.) and weight loss.  Psychiatric/Behavioral:  The patient is nervous/anxious (He has had issues with stress and anxiety in the past, but seems doing much better.).   All other systems reviewed and are negative.   I have reviewed and (if needed) personally updated the patient's problem list, medications, allergies, past medical and surgical history, social and family history.   PAST MEDICAL HISTORY   Past Medical  History:  Diagnosis Date   Coronary artery disease involving native coronary artery of native heart with angina pectoris ([place]) 10/03/2019   Cardiac Cath-PCI 10/02/2019: Culprit lesion mid LAD 99% heavily thrombotic lesion--> aspiration thrombectomy, DES PCI Synergy 3.0 x 23. 3.3 mm).  Otherwise essentially normal coronaries with RI, LCx and RCA free of significant disease-RCA has mild proximal 20%..  Relatively normal LVEDP 7 mmHg.   History of acute anterior wall MI (did not meet full STEMI criteria) 10/02/2019   associated with AIVR & Sustained VT - ? reperfusion / ischemic arrhythmias   Hyperlipidemia with target LDL less than 70 01/19/2016   Hypertriglyceridemia, familial 01/19/2016   Ischemic cardiomyopathy 10/03/2019   TTE 10/02/2019: (Post anterior MI) EF roughly 35%.  Mid-apical anteroseptal and inferoseptal akinesis.  Apical inferior apical anterior and apical akinesis.  GR 1 DD.     Sustained ventricular tachycardia ([place]) 10/03/2019   Admitted for ACS-troponin elevation, EKG with borderline anterior STEMI findings.  Had AIVR and sustained VT during stabilization overnight.    PAST SURGICAL HISTORY   Past Surgical History:  Procedure Laterality Date   CARDIAC CATHETERIZATION     CORONARY STENT INTERVENTION N/A 10/02/2019   Procedure: CORONARY STENT INTERVENTION;  Surgeon: Lorretta Harp, MD;  Location: [facility]; Culprit lesion mid LAD 99% heavily thrombotic lesion--> aspiration thrombectomy, DES PCI Synergy 3.0 x 23. 3.3 mm).     CORONARY THROMBECTOMY N/A 10/02/2019   Procedure: Coronary Thrombectomy;  Surgeon: Lorretta Harp, MD;  Location: [facility];  Service: Cardiovascular;; Heavily thrombotic 99% mLAD subTotal occlusion - Aspiration Thrombectomy of Large Red Thrombus   GUM SURGERY  1979   gum graft   HERNIA REPAIR     right side   LEFT HEART CATH AND CORONARY ANGIOGRAPHY N/A 10/02/2019   Procedure: LEFT HEART CATH AND CORONARY ANGIOGRAPHY;  Surgeon: Lorretta Harp, MD;  Location: [facility];  Service: Cardiovascular;; Culprit lesion mid LAD 99% heavily thrombotic lesion--> aspiration thrombectomy, DES PCI.  Otherwise essentially normal coronaries with RI, LCx and RCA free of significant disease-RCA has mild proximal 20%..  Relatively normal LVEDP 7 mmHg.   TRANSTHORACIC ECHOCARDIOGRAM  10/02/2019    (Post anterior MI) EF roughly 35%.  Mid-apical anteroseptal and inferoseptal akinesis.  Apical inferior apical anterior and apical akinesis.  GR 1 DD.  Otherwise normal valves, normal  RV and atriae   TRANSTHORACIC ECHOCARDIOGRAM  01/01/2020   EF 40 to 45%.  Mildly reduced LV function with apical akinesis along with anterolateral akinesis.  Normal RV function.  Normal RAP.  Mild aortic dilation -39 mm   Diagnostic        Intervention       Immunization History  Administered Date(s) Administered   PFIZER(Purple Top)SARS-COV-2 Vaccination 11/12/2019, 12/03/2019   Tdap 08/20/2009    MEDICATIONS/ALLERGIES   Current Meds  Medication Sig   acetaminophen (TYLENOL) 500 MG tablet Take 500 mg by mouth every 6 (six) hours as needed. Takes as needed.   atorvastatin (LIPITOR) 20 MG tablet Take 1 tablet (20 mg total) by mouth at bedtime.   BRILINTA 60 MG TABS tablet TAKE 1 TABLET BY MOUTH 2 TIMES DAILY.   Evolocumab (REPATHA SURECLICK) 287 MG/ML SOAJ Inject 1 Pen into the skin every 14 (fourteen) days.   loratadine (CLARITIN) 10 MG tablet Take 10 mg by mouth daily as needed for allergies.   metoprolol succinate (TOPROL-XL) 25 MG 24 hr tablet TAKE 1/2 TABLET BY MOUTH EVERY DAY   nitroGLYCERIN (NITROSTAT) 0.4 MG SL tablet Place 1 tablet under the tongue every 5 minutes as needed for chest pain, up to 3 doses in 15 minutes   sacubitril-valsartan (ENTRESTO) 24-26 MG TAKE 1 TABLET OF 24/26 MG IN THE MORNING AND 2 TABLETS 24/26 MG IN THE EVENING   [DISCONTINUED] JARDIANCE 10 MG TABS tablet TAKE 1 TABLET BY MOUTH DAILY BEFORE BREAKFAST.    No Known  Allergies  SOCIAL HISTORY/FAMILY HISTORY   Reviewed in Epic:  Pertinent findings:  Social History   Tobacco Use   Smoking status: Former    Packs/day: 0.25    Years: 13.00    Total pack years: 3.25    Types: Cigarettes    Quit date: 02/12/1996    Years since quitting: 25.8   Smokeless tobacco: Never  Substance Use Topics   Alcohol use: Yes    Alcohol/week: 3.0 standard drinks of alcohol    Types: 1 Glasses of wine, 2 Cans of beer per week   Drug use: No   Social History   Social History Narrative   Not on file    OBJCTIVE -PE, EKG, labs   Wt Readings from Last 3 Encounters:  11/22/21 199 lb (90.3 kg)  06/03/21 195 lb 6.4 oz (88.6 kg)  12/29/20 196 lb 6.4 oz (89.1 kg)    Physical Exam: BP 110/76   Pulse (!) 55   Ht 6' (1.829 m)   Wt 199 lb (90.3 kg)   SpO2 100%   BMI 26.99 kg/m  Physical Exam Vitals reviewed.  Constitutional:      General: He is not in acute distress.    Appearance: Normal appearance. He is normal weight. He is not ill-appearing or toxic-appearing.     Comments: Well-nourished, well-groomed.  Healthy-appearing.  Eyes:     Extraocular Movements: Extraocular movements intact.     Pupils: Pupils are equal, round, and reactive to light.  Neck:     Vascular: No carotid bruit or JVD.  Cardiovascular:     Rate and Rhythm: Regular rhythm. Bradycardia present. No extrasystoles are present.    Chest Wall: PMI is not displaced.     Pulses: Normal pulses.     Heart sounds: S1 normal and S2 normal. No murmur heard.    No friction rub. No gallop.  Pulmonary:     Effort: Pulmonary effort is normal. No respiratory distress.  Breath sounds: Normal breath sounds. No wheezing, rhonchi or rales.  Musculoskeletal:     Cervical back: Normal range of motion and neck supple.  Neurological:     Mental Status: He is alert.      Adult ECG Report  Rate: 55;  Rhythm: normal sinus rhythm and normal axis, intervals and durations. ;  Septal MI,  age-indeterminate  Narrative Interpretation: Stable  Recent Labs: Reviewed Lab Results  Component Value Date   CHOL 90 (L) 05/19/2021   HDL 49 05/19/2021   LDLCALC 19 05/19/2021   TRIG 125 05/19/2021   CHOLHDL 1.8 05/19/2021   Lab Results  Component Value Date   CREATININE 0.84 05/19/2021   BUN 14 05/19/2021   NA 143 05/19/2021   K 4.2 05/19/2021   CL 105 05/19/2021   CO2 26 05/19/2021      Latest Ref Rng & Units 12/29/2020    9:57 AM 10/03/2019    3:22 AM 10/02/2019    6:30 PM  CBC  WBC 3.4 - 10.8 x10E3/uL 5.1  5.9  6.4   Hemoglobin 13.0 - 17.7 g/dL 15.8  14.0  14.3   Hematocrit 37.5 - 51.0 % 47.5  42.5  42.4   Platelets 150 - 450 x10E3/uL 213  185  212     Lab Results  Component Value Date   HGBA1C 5.6 10/01/2019   Lab Results  Component Value Date   TSH 1.390 12/29/2020    ================================================== I spent a total of 25 minutes with the patient spent in direct patient consultation.  Additional time spent with chart review  / charting (studies, outside notes, etc): 17 min Total Time: 42 min  Current medicines are reviewed at length with the patient today.  (+/- concerns) none  Notice: This dictation was prepared with Dragon dictation along with smart phrase technology. Any transcriptional errors that result from this process are unintentional and may not be corrected upon review.  Studies Ordered:   Orders Placed This Encounter  Procedures   EKG 12-Lead   No orders of the defined types were placed in this encounter.   Patient Instructions / Medication Changes & Studies & Tests Ordered   Patient Instructions  Medication Instruction  Continue medication except do a one month statin "holiday "-   stop taking Atorvastatin If symptoms get better restart at 1/2 tablet daily , if does get better restart at regular dose  atrovastatin   *If you need a refill on your cardiac medications before your next appointment, please call your  pharmacy*   Lab Work:  Not needed    Testing/Procedures:  Not needed  Follow-Up: At [facility], you and your health needs are our priority.  As part of our continuing mission to provide you with exceptional heart care, we have created designated Provider Care Teams.  These Care Teams include your primary Cardiologist (physician) and Advanced Practice Providers (APPs -  Physician Assistants and Nurse Practitioners) who all work together to provide you with the care you need, when you need it.  We recommend signing up for the patient portal called "MyChart".  Sign up information is provided on this After Visit Summary.  MyChart is used to connect with patients for Virtual Visits (Telemedicine).  Patients are able to view lab/test results, encounter notes, upcoming appointments, etc.  Non-urgent messages can be sent to your provider as well.   To learn more about what you can do with MyChart, go to [email or web address].    Your next appointment:  12 month(s)  The format for your next appointment:   In Person  Provider:   Glenetta Hew, MD    Other Instructions      Leonie Man, MD, MS Glenetta Hew, M.D., M.S. Interventional Cardiologist  [facility]  Pager # [phone] Phone # [phone] [address]. [place], [place] [address]   Thank you for choosing [facility]!!

## 2021-11-23 ENCOUNTER — Other Ambulatory Visit: Payer: Self-pay | Admitting: Cardiology

## 2021-11-29 ENCOUNTER — Encounter: Payer: Self-pay | Admitting: Cardiology

## 2021-11-29 NOTE — Assessment & Plan Note (Signed)
EF thankfully up to 40-45%.  Echo does show LAD distribution wall motion abnormality which does make sense with the fact that he was having ongoing ischemia and NSVT at the time of presentation.  Thankfully, he has pretty much recovered fully:   NYHA Class I Symptoms.  Plan:   Continue max dose of Entresto 24/26 mg: 1 tab p.o. a.m., 2 tabs p.m. (this may be difference for his dizziness)  Continue low-dose Toprol at 12.5 mg daily.  Cannot push any further bradycardia 55 bpm at rest.  He is also on Jardiance 10 mg daily not requiring any diuretic

## 2021-11-29 NOTE — Assessment & Plan Note (Signed)
History of enlarged heart with pretty significant LAD infarct on Echo but some improvement in EF up to 40 to 45%..  Thankfully, despite having ischemic cardiomyopathy he has only class I symptoms of HFrEF.  No further angina.

## 2021-11-29 NOTE — Assessment & Plan Note (Signed)
Some overall improvement of EF of 30 to 35% up to 40 to 45% but the LAD wall does appear to be infarcted.  He is on good stable regimen with NYHA Class I symptoms, euvolemic without diuretic. On max tolerable doses of Entresto and Toprol along with Jardiance 10 mg

## 2021-11-29 NOTE — Assessment & Plan Note (Signed)
8 truthfully really does not have hypertension.  Not able to tolerate very significant doses of Entresto and Toprol.  Would not titrate medications further because of his prior issues with orthostatic hypotension and near syncope.

## 2021-11-29 NOTE — Assessment & Plan Note (Signed)
In the setting of ischemia.  Remains on low-dose beta-blocker but no recurrent episodes.  We need to continue to monitor as he clearly has some evidence of scar on echo.  Only able to tolerate low-dose beta-blocker due to bradycardia.Marland Kitchen

## 2021-11-29 NOTE — Assessment & Plan Note (Signed)
He has single-vessel disease in the LAD that was treated with stent.  A good portion of the ostial LAD distribution unfortunately was infarcted, but he is not having any further angina otherwise.  Plan:  Continue low-dose beta-blocker and max dose Entresto along with Jardiance for CAD and HFrEF  Continue Repatha and moderate dose statin for lipid control  Remains on maintenance dose of Brilinta 60 mg twice daily.  He is not 2 years out, because report to the LAD lesion, we both agree that we would prefer her to stay on maintenance dose Thienopyridine.  I talked about potentially switching to look Adderall, but he would prefer to stay on Brilinta.  Okay to hold Brilinta 5 to 7 days preop for surgeries or procedures.

## 2021-11-29 NOTE — Assessment & Plan Note (Signed)
He has not completed his second year of Thienopyridine coverage.  Per discussion with patient, he would prefer to stay on Brilinta at maintenance dose and not switch to Plavix versus simply go back to aspirin.  We will continue maintenance dose Brilinta 60 mg twice daily   Okay to hold Brilinta 5 to 7 days preop for surgeries or procedures.

## 2021-11-29 NOTE — Assessment & Plan Note (Signed)
>>  ASSESSMENT AND PLAN FOR HYPERLIPIDEMIA WITH TARGET LDL LESS THAN 70 WRITTEN ON 11/29/2021 12:35 AM BY [name], [name], MD  We dropped his atorvastatin down to 20 mg because of Myalgia symptoms.  Feeling much better on reduced dose. Had been on Praluent-converted to Repatha for insurance coverage. LDL 19. Excellent control.  Monitor for potential recurrence of myalgias with statin and reduce dose again if necessary.

## 2021-11-29 NOTE — Assessment & Plan Note (Signed)
We dropped his atorvastatin down to 20 mg because of Myalgia symptoms.  Feeling much better on reduced dose. Had been on Praluent-converted to [place] for insurance coverage. LDL 19. Excellent control.  Monitor for potential recurrence of myalgias with statin and reduce dose again if necessary.

## 2021-12-22 ENCOUNTER — Ambulatory Visit (INDEPENDENT_AMBULATORY_CARE_PROVIDER_SITE_OTHER): Payer: 59

## 2021-12-22 ENCOUNTER — Ambulatory Visit (INDEPENDENT_AMBULATORY_CARE_PROVIDER_SITE_OTHER): Payer: 59 | Admitting: Orthopaedic Surgery

## 2021-12-22 ENCOUNTER — Encounter: Payer: Self-pay | Admitting: Orthopaedic Surgery

## 2021-12-22 ENCOUNTER — Other Ambulatory Visit: Payer: Self-pay | Admitting: Cardiology

## 2021-12-22 DIAGNOSIS — M545 Low back pain, unspecified: Secondary | ICD-10-CM

## 2021-12-22 DIAGNOSIS — G8929 Other chronic pain: Secondary | ICD-10-CM

## 2021-12-22 MED ORDER — PREDNISONE 10 MG (21) PO TBPK: ORAL_TABLET | ORAL | 0 refills | Status: DC

## 2021-12-22 MED ORDER — HYDROCODONE-ACETAMINOPHEN 5-325 MG PO TABS: 1.0000 | ORAL_TABLET | Freq: Every day | ORAL | 0 refills | Status: DC | PRN

## 2021-12-22 MED ORDER — METHOCARBAMOL 750 MG PO TABS: 750.0000 mg | ORAL_TABLET | Freq: Two times a day (BID) | ORAL | 0 refills | Status: DC | PRN

## 2021-12-22 NOTE — Progress Notes (Signed)
Office Visit Note   Patient: Edward Pierce           Date of Birth: 03/27/1965           MRN: [id number] Visit Date: 12/22/2021              Requested by: Ronnell Freshwater, NP [place],  [place] [id number] PCP: Ronnell Freshwater, NP   Assessment & Plan: Visit Diagnoses:  1. Chronic midline low back pain without sciatica     Plan: Impression is recurrent midline low back pain.  At this point, I would like to start him on a steroid pack and muscle relaxer.  Have also sent in a small supply of Norco for which she will take sparingly.  If his symptoms do not improve or happen to worsen over the next week or so he will let me know we will get an MRI of the lumbar spine to assess for structural abnormalities.  Otherwise, follow-up as needed.  Follow-Up Instructions: Return if symptoms worsen or fail to improve.   Orders:  Orders Placed This Encounter  Procedures   XR Lumbar Spine 2-3 Views   Meds ordered this encounter  Medications   predniSONE (STERAPRED UNI-PAK 21 TAB) 10 MG (21) TBPK tablet    Sig: Take as directed    Dispense:  21 tablet    Refill:  0   methocarbamol (ROBAXIN-750) 750 MG tablet    Sig: Take 1 tablet (750 mg total) by mouth 2 (two) times daily as needed for muscle spasms.    Dispense:  20 tablet    Refill:  0   HYDROcodone-acetaminophen (NORCO) 5-325 MG tablet    Sig: Take 1-2 tablets by mouth daily as needed.    Dispense:  10 tablet    Refill:  0      Procedures: No procedures performed   Clinical Data: No additional findings.   Subjective: Chief Complaint  Patient presents with   Lower Back - Pain    HPI patient is a pleasant 56 year old gentleman who comes in today with midline low back pain for the past week which is progressively worsened.  He denies any injury but notes that a few weeks before he was pulling up wooden posts.  He also notes he was walking on concrete floors 3 days before the onset of symptoms.  The pain  he has is primarily occurs going from a seated to standing position.  He has been taking Tylenol without relief.  He denies any radiation down either leg and he denies any paresthesias to either lower extremity.  No bowel or bladder change or saddle paresthesias.  He has had these flareups in the past which have been relieved with NSAIDs and pain medicine.  Epidural steroid injection or MRI.  Has not previously been to physical therapy.  Of note, he sustained an MI few years ago and is on Brilinta.  Review of Systems as detailed in HPI.  All others reviewed and are negative.   Objective: Vital Signs: There were no vitals taken for this visit.  Physical Exam well-developed well-nourished gentleman in no acute distress.  Alert and oriented x3.  Ortho Exam lumbar spine exam shows moderate tenderness around L5.  No paraspinous musculature tenderness.  Does have slight pain with lumbar extension.  Positive straight leg raise on the right.  No focal weakness.  He is neurovascular intact distally.  Specialty Comments:  No specialty comments available.  Imaging:  No results found.   PMFS History: Patient Active Problem List   Diagnosis Date Noted   Body mass index 26.0-26.9, adult 12/29/2020   Atherosclerosis of native coronary artery of native heart without angina pectoris 10/03/2019   Presence of drug coated stent in LAD coronary artery 10/03/2019   Sustained ventricular tachycardia ([place]) 10/03/2019   Ischemic cardiomyopathy 10/03/2019   HFrEF (heart failure with reduced ejection fraction) ([place]) 10/03/2019   History of acute anterior wall MI (did not meet full STEMI criteria) 10/02/2019   Screening for prostate cancer 07/18/2016   Elevated blood pressure reading without diagnosis of hypertension 03/02/2016   Vitamin D deficiency 01/23/2016   Excessive drinking of alcohol at times 01/23/2016   Hypertriglyceridemia, familial 01/19/2016   Hyperlipidemia with target LDL less than 70 01/19/2016    Low HDL (under 40) 01/19/2016   Overweight (BMI 25.0-29.9) 12/29/2015   Periodic heart flutter 12/29/2015   Past Medical History:  Diagnosis Date   Coronary artery disease involving native coronary artery of native heart with angina pectoris ([place]) 10/03/2019   Cardiac Cath-PCI 10/02/2019: Culprit lesion mid LAD 99% heavily thrombotic lesion--> aspiration thrombectomy, DES PCI Synergy 3.0 x 23. 3.3 mm).  Otherwise essentially normal coronaries with RI, LCx and RCA free of significant disease-RCA has mild proximal 20%..  Relatively normal LVEDP 7 mmHg.   History of acute anterior wall MI (did not meet full STEMI criteria) 10/02/2019   associated with AIVR & Sustained VT - ? reperfusion / ischemic arrhythmias   Hyperlipidemia with target LDL less than 70 01/19/2016   Hypertriglyceridemia, familial 01/19/2016   Ischemic cardiomyopathy 10/03/2019   TTE 10/02/2019: (Post anterior MI) EF roughly 35%.  Mid-apical anteroseptal and inferoseptal akinesis.  Apical inferior apical anterior and apical akinesis.  GR 1 DD.     Sustained ventricular tachycardia ([place]) 10/03/2019   Admitted for ACS-troponin elevation, EKG with borderline anterior STEMI findings.  Had AIVR and sustained VT during stabilization overnight.    Family History  Problem Relation Age of Onset   Cancer Mother        breast   Diabetes Mother    Hypertension Mother    Heart disease Mother        CAD   Breast cancer Mother    Hyperlipidemia Father    COPD Father    Heart disease Father        CAD   Heart attack Paternal Grandfather     Past Surgical History:  Procedure Laterality Date   CARDIAC CATHETERIZATION     CORONARY STENT INTERVENTION N/A 10/02/2019   Procedure: CORONARY STENT INTERVENTION;  Surgeon: Lorretta Harp, MD;  Location: [facility]; Culprit lesion mid LAD 99% heavily thrombotic lesion--> aspiration thrombectomy, DES PCI Synergy 3.0 x 23. 3.3 mm).     CORONARY THROMBECTOMY N/A 10/02/2019   Procedure:  Coronary Thrombectomy;  Surgeon: Lorretta Harp, MD;  Location: [facility];  Service: Cardiovascular;; Heavily thrombotic 99% mLAD subTotal occlusion - Aspiration Thrombectomy of Large Red Thrombus   GUM SURGERY  1979   gum graft   HERNIA REPAIR     right side   LEFT HEART CATH AND CORONARY ANGIOGRAPHY N/A 10/02/2019   Procedure: LEFT HEART CATH AND CORONARY ANGIOGRAPHY;  Surgeon: Lorretta Harp, MD;  Location: [facility];  Service: Cardiovascular;; Culprit lesion mid LAD 99% heavily thrombotic lesion--> aspiration thrombectomy, DES PCI.  Otherwise essentially normal coronaries with RI, LCx and RCA free of significant disease-RCA has mild  proximal 20%..  Relatively normal LVEDP 7 mmHg.   TRANSTHORACIC ECHOCARDIOGRAM  10/02/2019    (Post anterior MI) EF roughly 35%.  Mid-apical anteroseptal and inferoseptal akinesis.  Apical inferior apical anterior and apical akinesis.  GR 1 DD.  Otherwise normal valves, normal RV and atriae   TRANSTHORACIC ECHOCARDIOGRAM  01/01/2020   EF 40 to 45%.  Mildly reduced LV function with apical akinesis along with anterolateral akinesis.  Normal RV function.  Normal RAP.  Mild aortic dilation -39 mm   Social History   Occupational History    Employer: [employer]    Comment: He works from home  Tobacco Use   Smoking status: Former    Packs/day: 0.25    Years: 13.00    Total pack years: 3.25    Types: Cigarettes    Quit date: 02/12/1996    Years since quitting: 25.8   Smokeless tobacco: Never  Substance and Sexual Activity   Alcohol use: Yes    Alcohol/week: 3.0 standard drinks of alcohol    Types: 1 Glasses of wine, 2 Cans of beer per week   Drug use: No   Sexual activity: Yes    Birth control/protection: None

## 2022-01-18 ENCOUNTER — Telehealth: Payer: Self-pay | Admitting: *Deleted

## 2022-01-18 NOTE — Patient Outreach (Signed)
  Care Coordination   01/18/2022 Name: Edward Pierce MRN: [id number] DOB: [birth year]   Care Coordination Outreach Attempts:  An unsuccessful telephone outreach was attempted today to offer the patient information about available care coordination services as a benefit of their health plan.   Follow Up Plan:  Additional outreach attempts will be made to offer the patient care coordination information and services.   Encounter Outcome:  No Answer  Care Coordination Interventions Activated:  No   Care Coordination Interventions:  No, not indicated    Eduard Clos MSW, LCSW Licensed Clinical Social Worker      [phone]

## 2022-01-18 NOTE — Patient Outreach (Signed)
  Care Coordination   Initial Visit Note   01/18/2022 Name: MERWYN HODAPP MRN: [id number] DOB: 04-30-1965  Edward Pierce is a 56 y.o. year old male who sees Boscia, Greer Ee, NP for primary care. I spoke with  Edward Pierce by phone today.  What matters to the patients health and wellness today?  Pt returned call to [place] and was briefed on Care Coordination as well as reminded of AWV. Pt denies need for Care Coordination at this time as he is working for [employer], uses [facility] and stays in contact with Providers as needed. Denies an y SDOH needs, Pharmacy or [facility].      Goals Addressed   None     SDOH assessments and interventions completed:  No     Care Coordination Interventions Activated:  No  Care Coordination Interventions:  No, not indicated   Follow up plan: No further intervention required.   Encounter Outcome:  Pt. Visit Completed

## 2022-04-08 ENCOUNTER — Ambulatory Visit (INDEPENDENT_AMBULATORY_CARE_PROVIDER_SITE_OTHER): Payer: 59 | Admitting: Nurse Practitioner

## 2022-04-08 ENCOUNTER — Encounter: Payer: Self-pay | Admitting: Nurse Practitioner

## 2022-04-08 VITALS — BP 111/71 | HR 55 | Ht 72.0 in | Wt 208.8 lb

## 2022-04-08 DIAGNOSIS — K4090 Unilateral inguinal hernia, without obstruction or gangrene, not specified as recurrent: Secondary | ICD-10-CM | POA: Diagnosis not present

## 2022-04-08 NOTE — Progress Notes (Signed)
Established patient visit   Patient: Edward Pierce   DOB: 12-09-1965   57 y.o. Male  MRN: [id number] Visit Date: 04/08/2022   Chief Complaint  Patient presents with   Hernia   Subjective    HPI  Possible left inguinal hernia. -noticed a few months ago. -not changing.  -not painful most of the time.  -if very active or exerting himself, it can get tender.  -history of right inguinal hernial. Had surgical repair with mesh.    Medications: Outpatient Medications Prior to Visit  Medication Sig   acetaminophen (TYLENOL) 500 MG tablet Take 500 mg by mouth every 6 (six) hours as needed. Takes as needed.   BRILINTA 60 MG TABS tablet TAKE 1 TABLET BY MOUTH 2 TIMES DAILY.   ENTRESTO 24-26 MG TAKE 1 TABLET OF 24/26 MG IN THE MORNING AND 2 TABLETS 24/26 MG IN THE EVENING   Evolocumab (REPATHA SURECLICK) XX123456 MG/ML SOAJ Inject 1 Pen into the skin every 14 (fourteen) days.   JARDIANCE 10 MG TABS tablet TAKE 1 TABLET BY MOUTH EVERY DAY BEFORE BREAKFAST   loratadine (CLARITIN) 10 MG tablet Take 10 mg by mouth daily as needed for allergies.   metoprolol succinate (TOPROL-XL) 25 MG 24 hr tablet TAKE 1/2 TABLET BY MOUTH EVERY DAY   nitroGLYCERIN (NITROSTAT) 0.4 MG SL tablet Place 1 tablet under the tongue every 5 minutes as needed for chest pain, up to 3 doses in 15 minutes   atorvastatin (LIPITOR) 20 MG tablet Take 1 tablet (20 mg total) by mouth at bedtime.   [DISCONTINUED] HYDROcodone-acetaminophen (NORCO) 5-325 MG tablet Take 1-2 tablets by mouth daily as needed.   [DISCONTINUED] methocarbamol (ROBAXIN-750) 750 MG tablet Take 1 tablet (750 mg total) by mouth 2 (two) times daily as needed for muscle spasms.   [DISCONTINUED] predniSONE (STERAPRED UNI-PAK 21 TAB) 10 MG (21) TBPK tablet Take as directed   No facility-administered medications prior to visit.    Review of Systems  Constitutional:  Negative for activity change, chills, fatigue and fever.  HENT:  Negative for congestion, postnasal  drip, rhinorrhea, sinus pressure, sinus pain, sneezing and sore throat.   Eyes: Negative.   Respiratory:  Negative for cough, shortness of breath and wheezing.   Cardiovascular:  Negative for chest pain and palpitations.  Gastrointestinal:  Negative for constipation, diarrhea, nausea and vomiting.  Endocrine: Negative for cold intolerance, heat intolerance, polydipsia and polyuria.  Genitourinary:  Negative for dysuria, frequency and urgency.       Problable left inguinal hernia.   Musculoskeletal:  Negative for back pain and myalgias.  Skin:  Negative for rash.  Allergic/Immunologic: Negative for environmental allergies.  Neurological:  Negative for dizziness, weakness and headaches.  Psychiatric/Behavioral:  The patient is not nervous/anxious.        Objective     Today's Vitals   04/08/22 0920  BP: 111/71  Pulse: (Abnormal) 55  SpO2: 97%  Weight: 208 lb 12.8 oz (94.7 kg)  Height: 6' (1.829 m)   Body mass index is 28.32 kg/m.  BP Readings from Last 3 Encounters:  04/08/22 111/71  11/22/21 110/76  06/03/21 92/60    Wt Readings from Last 3 Encounters:  04/08/22 208 lb 12.8 oz (94.7 kg)  11/22/21 199 lb (90.3 kg)  06/03/21 195 lb 6.4 oz (88.6 kg)    Physical Exam Vitals and nursing note reviewed.  Constitutional:      Appearance: Normal appearance. He is well-developed.  HENT:     Head: Normocephalic and  atraumatic.  Eyes:     Pupils: Pupils are equal, round, and reactive to light.  Cardiovascular:     Rate and Rhythm: Normal rate and regular rhythm.     Pulses: Normal pulses.     Heart sounds: Normal heart sounds.  Pulmonary:     Effort: Pulmonary effort is normal.     Breath sounds: Normal breath sounds.  Abdominal:     General: Bowel sounds are normal. There is no distension.     Palpations: Abdomen is soft.     Tenderness: There is no abdominal tenderness. There is no right CVA tenderness or guarding.     Hernia: A hernia is present.     Musculoskeletal:        General: Normal range of motion.     Cervical back: Normal range of motion and neck supple.  Lymphadenopathy:     Cervical: No cervical adenopathy.  Skin:    General: Skin is warm and dry.     Capillary Refill: Capillary refill takes less than 2 seconds.  Neurological:     General: No focal deficit present.     Mental Status: He is alert and oriented to person, place, and time.  Psychiatric:        Mood and Affect: Mood normal.        Behavior: Behavior normal.        Thought Content: Thought content normal.        Judgment: Judgment normal.      Assessment & Plan    1. Left inguinal hernia Left pelvic ultrasound ordered. Refer to surgery as indicated.  - US Pelvis Limited; Future    Problem List Items Addressed This Visit       Other   Left inguinal hernia - Primary   Relevant Orders   US Pelvis Limited (Completed)     Return for as scheduled.         Ronnell Freshwater, NP  [facility] [phone] (phone) [phone] (fax)  [place]

## 2022-04-11 IMAGING — CR DG CHEST 2V
2 series · 2 of 2 positions shown · non-contrast
Comparison: None.

CLINICAL DATA: Chest pain

EXAM:
CHEST - 2 VIEW

[chest pa]
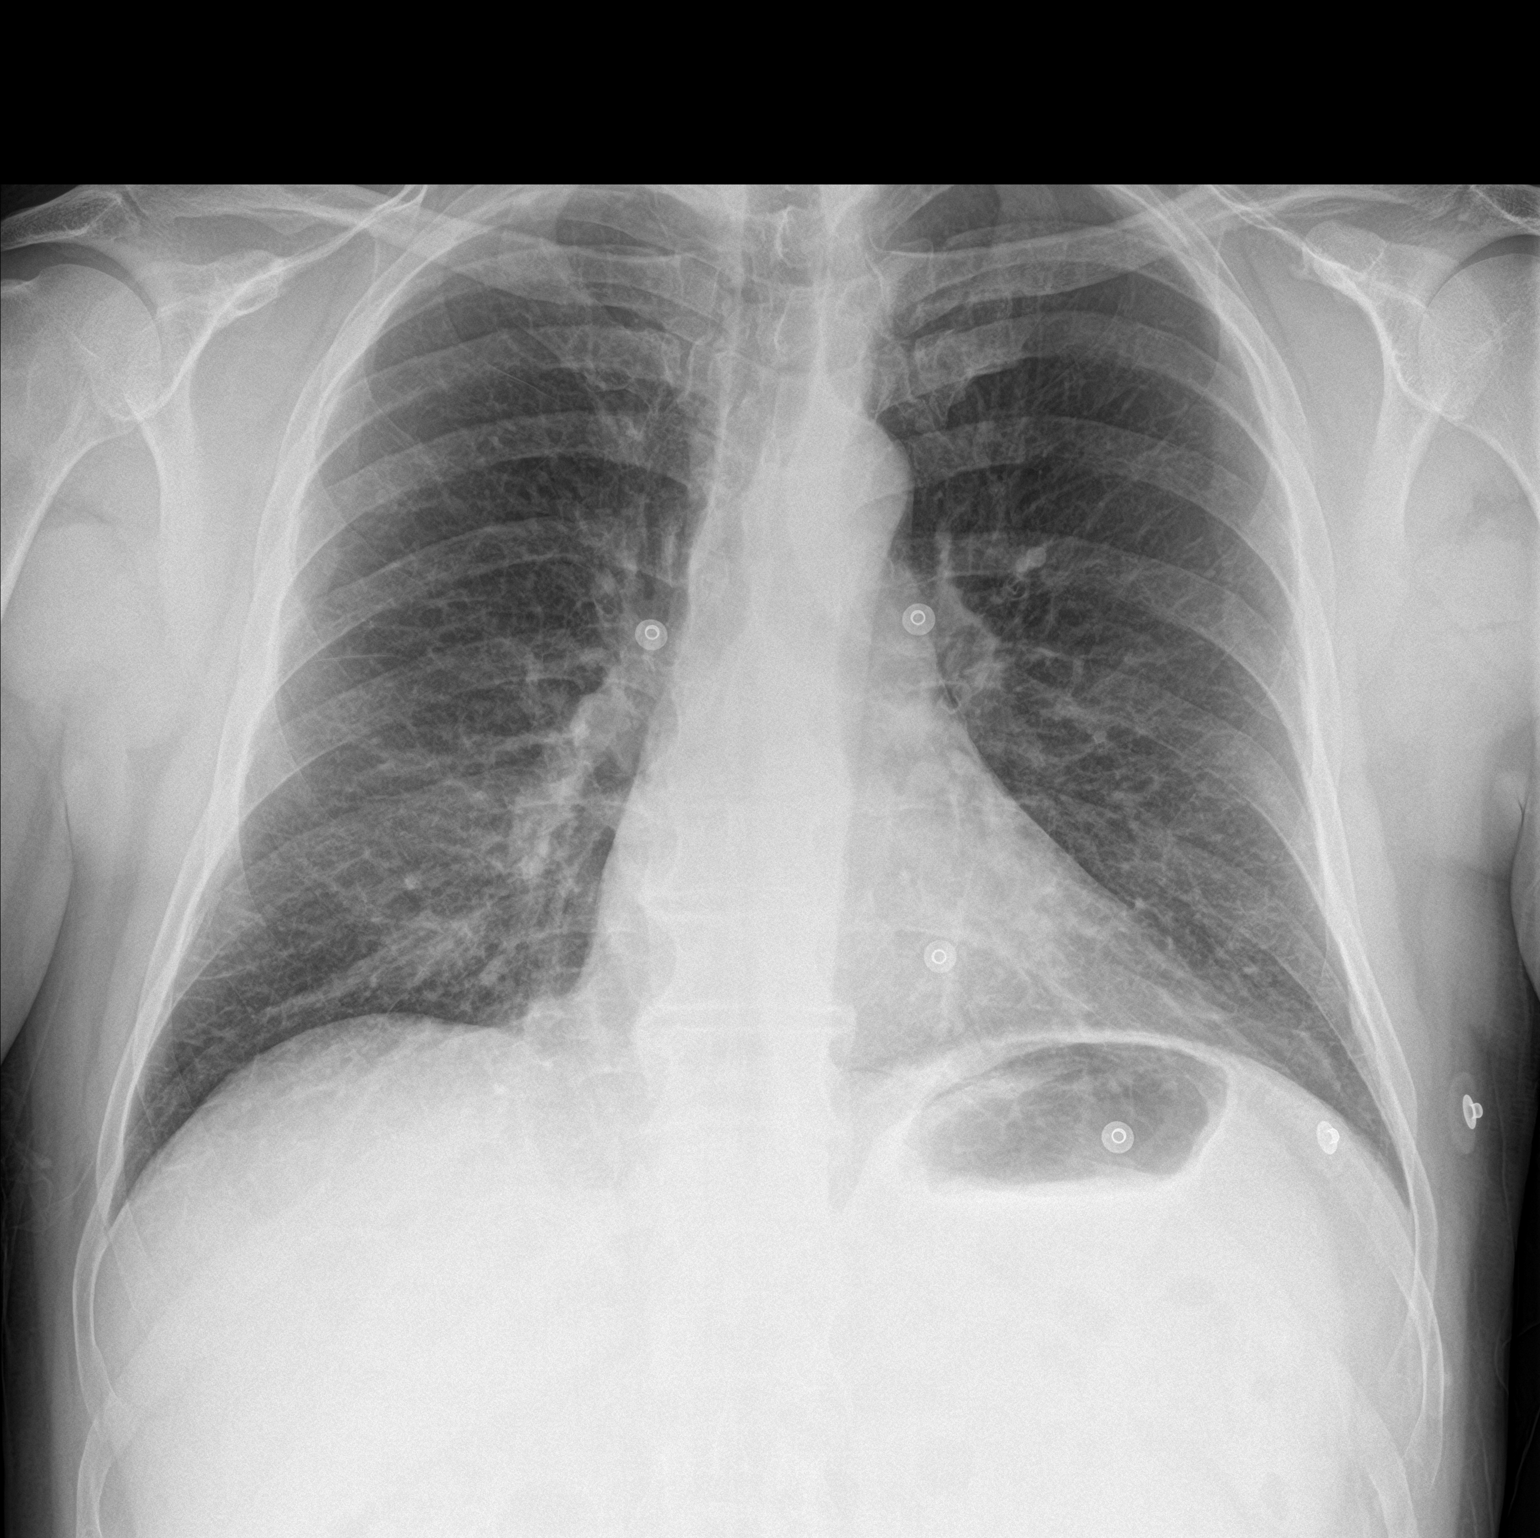

[chest lat]
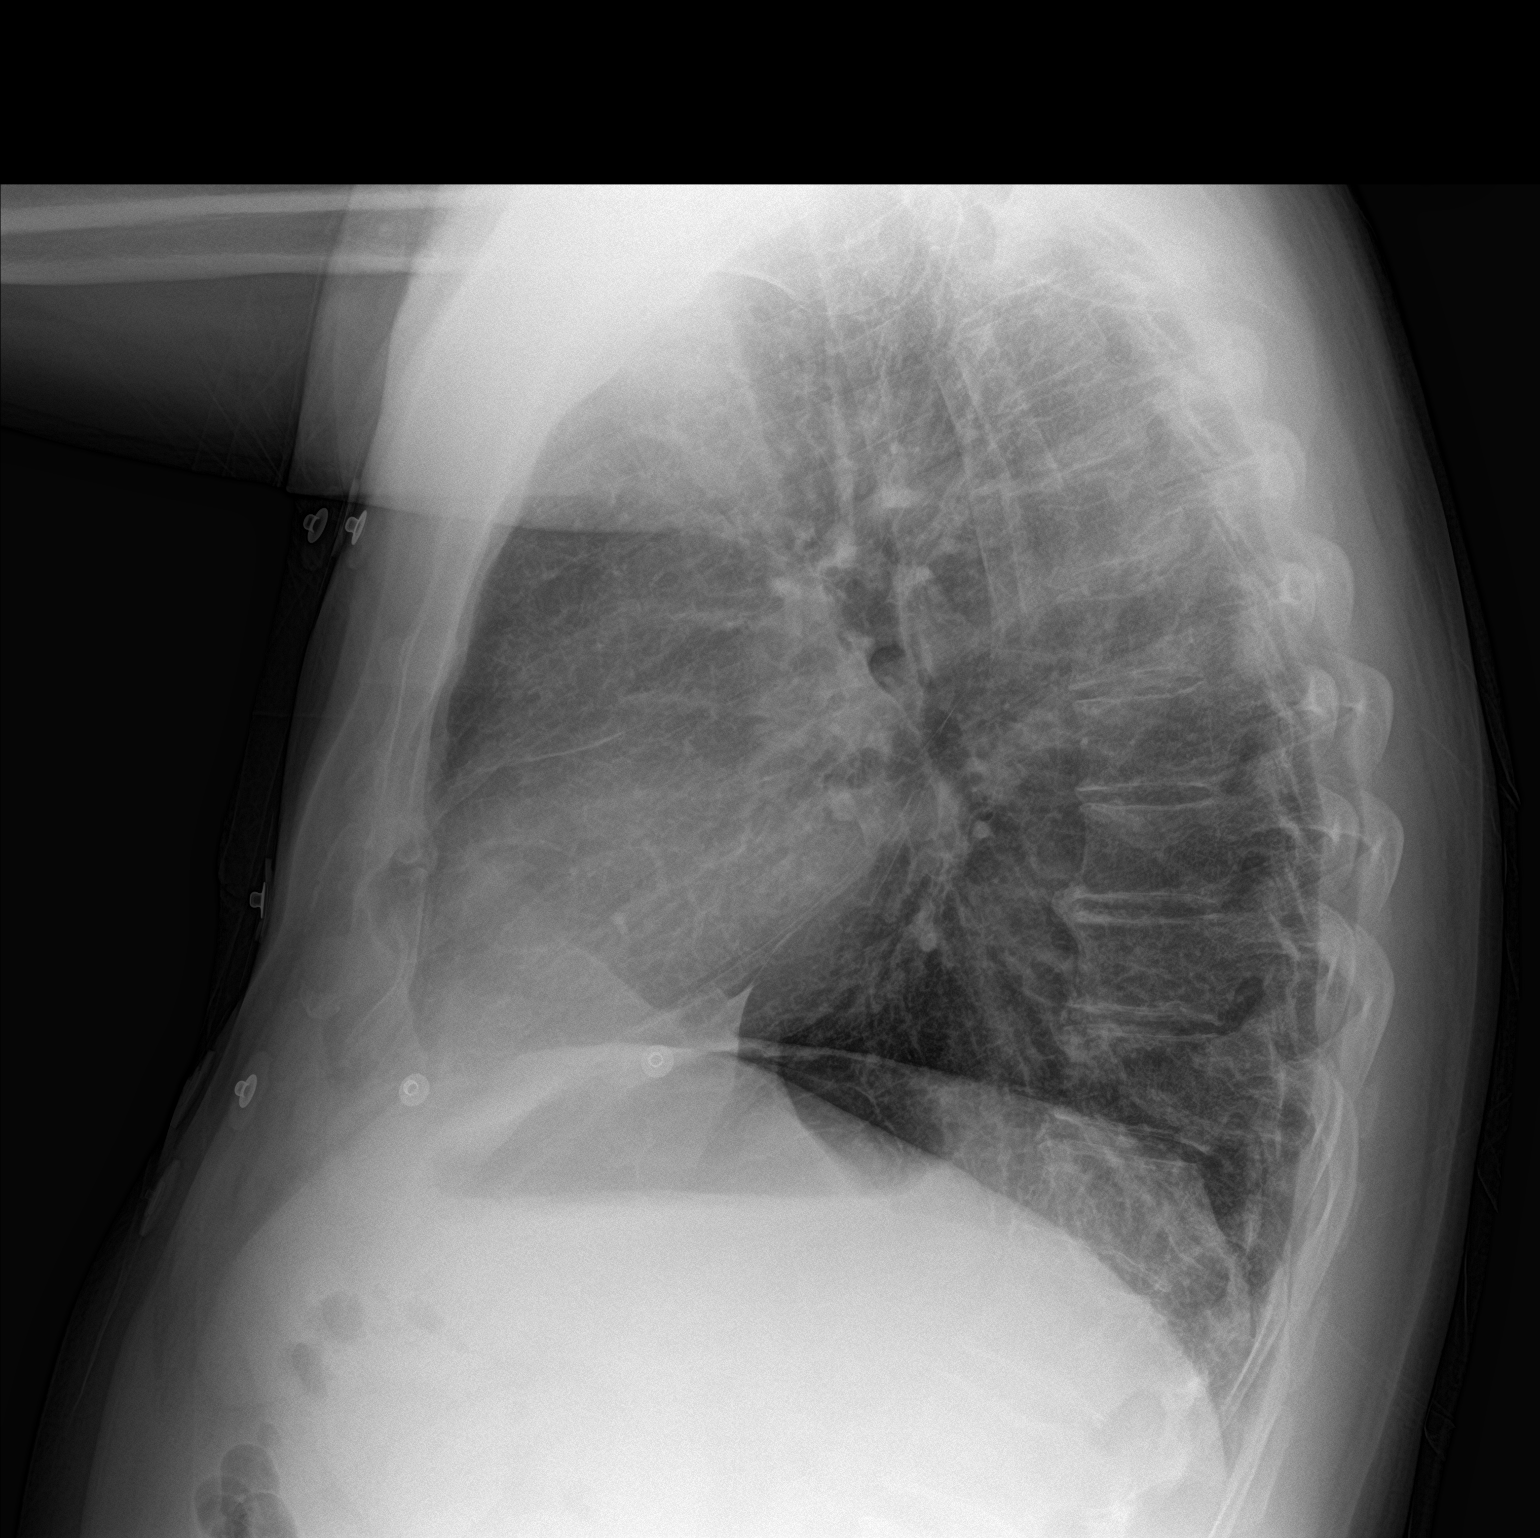

[2 of 2 positions shown; findings below may reference images not displayed]

FINDINGS: The heart size and mediastinal contours are within normal limits.
Both lungs are clear. Mild degenerative changes of the spine.
IMPRESSION: No active cardiopulmonary disease.

## 2022-04-14 ENCOUNTER — Ambulatory Visit
Admission: RE | Admit: 2022-04-14 | Discharge: 2022-04-14 | Disposition: A | Discharge status: Home / Self Care | Payer: 59 | Source: Ambulatory Visit | Attending: Nurse Practitioner | Admitting: Nurse Practitioner

## 2022-04-14 DIAGNOSIS — K4090 Unilateral inguinal hernia, without obstruction or gangrene, not specified as recurrent: Secondary | ICD-10-CM

## 2022-04-20 ENCOUNTER — Telehealth: Payer: Self-pay

## 2022-04-20 NOTE — Telephone Encounter (Signed)
Pt is calling for the results and to check to see if he needed a referral for hernia surgery.

## 2022-04-21 ENCOUNTER — Other Ambulatory Visit: Payer: Self-pay | Admitting: Nurse Practitioner

## 2022-04-21 DIAGNOSIS — K4090 Unilateral inguinal hernia, without obstruction or gangrene, not specified as recurrent: Secondary | ICD-10-CM

## 2022-04-21 NOTE — Telephone Encounter (Signed)
Not needed

## 2022-04-21 NOTE — Telephone Encounter (Signed)
Please let the patient know that ultrasound did show fat containing left inguinal hernia measuring 2.8 X 1.8 X 2.7 cm in diameter. I have placed a referral for him to see [facility] for further evaluation and treatment.  Thanks  -HB

## 2022-04-21 NOTE — Telephone Encounter (Signed)
LVM for return call to discuss result and recommendations

## 2022-05-07 DIAGNOSIS — K4090 Unilateral inguinal hernia, without obstruction or gangrene, not specified as recurrent: Secondary | ICD-10-CM | POA: Insufficient documentation

## 2022-05-09 ENCOUNTER — Ambulatory Visit: Payer: Self-pay | Admitting: Surgery

## 2022-05-09 ENCOUNTER — Telehealth: Payer: Self-pay | Admitting: *Deleted

## 2022-05-09 NOTE — Telephone Encounter (Signed)
Spoke with patient and scheduled him for a pre-op clearance appt on 05/16/22 at 2:40 PM. Meds reviewed and consent is in. Will route to requesting surgeons office to make them aware.

## 2022-05-09 NOTE — Telephone Encounter (Signed)
   Pre-operative Risk Assessment    Patient Name: Edward Pierce  DOB: March 15, 1965 MRN: [id number]      Request for Surgical Clearance    Procedure:   HERNIA SURGERY  Date of Surgery:  Clearance TBD                                 Surgeon:  DR. Erroll Luna Surgeon's Group or Practice Name:  [facility] Phone number:  [phone] Fax number:  [phone] ATTN: Carlene Coria, CMA   Type of Clearance Requested:   - Medical  - Pharmacy:  Hold Ticagrelor (Brilinta)     Type of Anesthesia:  General    Additional requests/questions:    Jiles Prows   05/09/2022, 3:30 PM

## 2022-05-09 NOTE — Telephone Encounter (Signed)
  Patient Consent for Virtual Visit        Edward Pierce has provided verbal consent on 05/09/2022 for a virtual visit (video or telephone).   CONSENT FOR VIRTUAL VISIT FOR:  Edward Pierce  By participating in this virtual visit I agree to the following:  I hereby voluntarily request, consent and authorize [facility] and its employed or contracted physicians, physician assistants, nurse practitioners or other licensed health care professionals (the Practitioner), to provide me with telemedicine health care services (the "Services") as deemed necessary by the treating Practitioner. I acknowledge and consent to receive the Services by the Practitioner via telemedicine. I understand that the telemedicine visit will involve communicating with the Practitioner through live audiovisual communication technology and the disclosure of certain medical information by electronic transmission. I acknowledge that I have been given the opportunity to request an in-person assessment or other available alternative prior to the telemedicine visit and am voluntarily participating in the telemedicine visit.  I understand that I have the right to withhold or withdraw my consent to the use of telemedicine in the course of my care at any time, without affecting my right to future care or treatment, and that the Practitioner or I may terminate the telemedicine visit at any time. I understand that I have the right to inspect all information obtained and/or recorded in the course of the telemedicine visit and may receive copies of available information for a reasonable fee.  I understand that some of the potential risks of receiving the Services via telemedicine include:  Delay or interruption in medical evaluation due to technological equipment failure or disruption; Information transmitted may not be sufficient (e.g. poor resolution of images) to allow for appropriate medical decision making by the Practitioner;  and/or  In rare instances, security protocols could fail, causing a breach of personal health information.  Furthermore, I acknowledge that it is my responsibility to provide information about my medical history, conditions and care that is complete and accurate to the best of my ability. I acknowledge that Practitioner's advice, recommendations, and/or decision may be based on factors not within their control, such as incomplete or inaccurate data provided by me or distortions of diagnostic images or specimens that may result from electronic transmissions. I understand that the practice of medicine is not an exact science and that Practitioner makes no warranties or guarantees regarding treatment outcomes. I acknowledge that a copy of this consent can be made available to me via my patient portal ([facility]), or I can request a printed copy by calling the office of [facility].    I understand that my insurance will be billed for this visit.   I have read or had this consent read to me. I understand the contents of this consent, which adequately explains the benefits and risks of the Services being provided via telemedicine.  I have been provided ample opportunity to ask questions regarding this consent and the Services and have had my questions answered to my satisfaction. I give my informed consent for the services to be provided through the use of telemedicine in my medical care

## 2022-05-09 NOTE — Telephone Encounter (Signed)
   Name: Edward Pierce  DOB: Oct 26, 1965  MRN: [id number]  Primary Cardiologist: Glenetta Hew, MD   Preoperative team, please contact this patient and set up a phone call appointment for further preoperative risk assessment. Please obtain consent and complete medication review. Thank you for your help.  I confirm that guidance regarding antiplatelet and oral anticoagulation therapy has been completed and, if necessary, noted below: Per Dr. Allison Quarry office visit note in 11/2021, okay to hold Brilinta for 5-7 days prior to surgeries/ procedures.   Darreld Mclean, PA-C 05/09/2022, 4:19 PM [place]

## 2022-05-16 ENCOUNTER — Ambulatory Visit: Payer: 59 | Attending: Cardiovascular Disease

## 2022-05-16 DIAGNOSIS — Z01810 Encounter for preprocedural cardiovascular examination: Secondary | ICD-10-CM

## 2022-05-16 NOTE — Progress Notes (Signed)
Virtual Visit via Telephone Note   Because of Edward Pierce's co-morbid illnesses, he is at least at moderate risk for complications without adequate follow up.  This format is felt to be most appropriate for this patient at this time.  The patient did not have access to video technology/had technical difficulties with video requiring transitioning to audio format only (telephone).  All issues noted in this document were discussed and addressed.  No physical exam could be performed with this format.  Please refer to the patient's chart for his consent to telehealth for [facility].  Evaluation Performed:  Preoperative cardiovascular risk assessment _____________   Date:  05/16/2022   Patient ID:  Edward Pierce, DOB Nov 11, 1965, MRN [id number] Patient Location:  Home Provider location:   Office  Primary Care Provider:  Ronnell Freshwater, NP Primary Cardiologist:  Glenetta Hew, MD  Chief Complaint / Patient Profile   57 y.o. y/o male with a h/o coronary artery disease, HFrEF, hyperlipidemia, who is pending hernia surgery and presents today for telephonic preoperative cardiovascular risk assessment.  History of Present Illness    Edward Pierce is a 57 y.o. male who presents via audio/video conferencing for a telehealth visit today.  Pt was last seen in cardiology clinic on 11/22/2021 by Dr. Ellyn Hack.  At that time Edward Pierce was doing well .  The patient is now pending procedure as outlined above. Since his last visit, he  continues to do well from a cardiac standpoint.  Today he denies chest pain, shortness of breath, lower extremity edema, fatigue, palpitations, melena, hematuria, hemoptysis, diaphoresis, weakness, presyncope, syncope, orthopnea, and PND.   Past Medical History    Past Medical History:  Diagnosis Date   Coronary artery disease involving native coronary artery of native heart with angina pectoris ([place]) 10/03/2019   Cardiac Cath-PCI 10/02/2019: Culprit lesion  mid LAD 99% heavily thrombotic lesion--> aspiration thrombectomy, DES PCI Synergy 3.0 x 23. 3.3 mm).  Otherwise essentially normal coronaries with RI, LCx and RCA free of significant disease-RCA has mild proximal 20%..  Relatively normal LVEDP 7 mmHg.   History of acute anterior wall MI (did not meet full STEMI criteria) 10/02/2019   associated with AIVR & Sustained VT - ? reperfusion / ischemic arrhythmias   Hyperlipidemia with target LDL less than 70 01/19/2016   Hypertriglyceridemia, familial 01/19/2016   Ischemic cardiomyopathy 10/03/2019   TTE 10/02/2019: (Post anterior MI) EF roughly 35%.  Mid-apical anteroseptal and inferoseptal akinesis.  Apical inferior apical anterior and apical akinesis.  GR 1 DD.     Sustained ventricular tachycardia ([place]) 10/03/2019   Admitted for ACS-troponin elevation, EKG with borderline anterior STEMI findings.  Had AIVR and sustained VT during stabilization overnight.   Past Surgical History:  Procedure Laterality Date   CARDIAC CATHETERIZATION     CORONARY STENT INTERVENTION N/A 10/02/2019   Procedure: CORONARY STENT INTERVENTION;  Surgeon: Lorretta Harp, MD;  Location: [place]; Culprit lesion mid LAD 99% heavily thrombotic lesion--> aspiration thrombectomy, DES PCI Synergy 3.0 x 23. 3.3 mm).     CORONARY THROMBECTOMY N/A 10/02/2019   Procedure: Coronary Thrombectomy;  Surgeon: Lorretta Harp, MD;  Location: [facility];  Service: Cardiovascular;; Heavily thrombotic 99% mLAD subTotal occlusion - Aspiration Thrombectomy of Large Red Thrombus   GUM SURGERY  1979   gum graft   HERNIA REPAIR     right side   LEFT HEART CATH AND CORONARY ANGIOGRAPHY N/A 10/02/2019  Procedure: LEFT HEART CATH AND CORONARY ANGIOGRAPHY;  Surgeon: Lorretta Harp, MD;  Location: [facility] [place] [facility];  Service: Cardiovascular;; Culprit lesion mid LAD 99% heavily thrombotic lesion--> aspiration thrombectomy, DES PCI.  Otherwise essentially normal coronaries with RI,  LCx and RCA free of significant disease-RCA has mild proximal 20%..  Relatively normal LVEDP 7 mmHg.   TRANSTHORACIC ECHOCARDIOGRAM  10/02/2019    (Post anterior MI) EF roughly 35%.  Mid-apical anteroseptal and inferoseptal akinesis.  Apical inferior apical anterior and apical akinesis.  GR 1 DD.  Otherwise normal valves, normal RV and atriae   TRANSTHORACIC ECHOCARDIOGRAM  01/01/2020   EF 40 to 45%.  Mildly reduced LV function with apical akinesis along with anterolateral akinesis.  Normal RV function.  Normal RAP.  Mild aortic dilation -39 mm    Allergies  No Known Allergies  Home Medications    Prior to Admission medications   Medication Sig Start Date End Date Taking? Authorizing Provider  acetaminophen (TYLENOL) 500 MG tablet Take 500 mg by mouth every 6 (six) hours as needed. Takes as needed.    [provider]  atorvastatin (LIPITOR) 20 MG tablet Take 1 tablet (20 mg total) by mouth at bedtime. 06/21/21 11/22/21  Leonie Man, MD  BRILINTA 60 MG TABS tablet TAKE 1 TABLET BY MOUTH 2 TIMES DAILY. 10/25/21   Leonie Man, MD  ENTRESTO 24-26 MG TAKE 1 TABLET OF 24/26 MG IN THE MORNING AND 2 TABLETS 24/26 MG IN THE EVENING 12/23/21   Leonie Man, MD  Evolocumab (REPATHA SURECLICK) [id number] MG/ML SOAJ Inject 1 Pen into the skin every 14 (fourteen) days. 09/17/21   Leonie Man, MD  JARDIANCE 10 MG TABS tablet TAKE 1 TABLET BY MOUTH EVERY DAY BEFORE BREAKFAST 11/23/21   Leonie Man, MD  loratadine (CLARITIN) 10 MG tablet Take 10 mg by mouth daily as needed for allergies.    [provider]  metoprolol succinate (TOPROL-XL) 25 MG 24 hr tablet TAKE 1/2 TABLET BY MOUTH EVERY DAY 10/25/21   Leonie Man, MD  nitroGLYCERIN (NITROSTAT) 0.4 MG SL tablet Place 1 tablet under the tongue every 5 minutes as needed for chest pain, up to 3 doses in 15 minutes 11/06/20   Leonie Man, MD    Physical Exam    Vital Signs:  Edward Pierce does not have vital signs  available for review today.  Given telephonic nature of communication, physical exam is limited. AAOx3. NAD. Normal affect.  Speech and respirations are unlabored.  Accessory Clinical Findings    None  Assessment & Plan    1.  Preoperative Cardiovascular Risk Assessment: Hernia surgery, Dr. Erroll Luna, [place] [facility], TBD  The patient was advised that if he develops new symptoms prior to surgery to contact our office to arrange for a follow-up visit, and he verbalized understanding.      Primary Cardiologist: Glenetta Hew, MD  Chart reviewed as part of pre-operative protocol coverage. Given past medical history and time since last visit, based on ACC/AHA guidelines, JAHBARI YOUKER would be at acceptable risk for the planned procedure without further cardiovascular testing.   His RCRI is a class III risk, 6.6% risk of major cardiac event.  He is able to complete greater than 4 METS of physical activity.  His Brilinta may be held for 5 to 7 days prior to his procedure.  Please resume as soon as hemostasis is achieved.  Patient was advised that if he  develops new symptoms prior to surgery to contact our office to arrange a follow-up appointment.  He verbalized understanding.  I will route this recommendation to the requesting party via Epic fax function and remove from pre-op pool.      A copy of this note will be routed to requesting surgeon.  Time:   Today, I have spent 6 minutes with the patient with telehealth technology discussing medical history, symptoms, and management plan.  Prior to his phone evaluation I spent greater than 10 minutes reviewing his past medical history and cardiac medications.   Deberah Pelton, NP  05/16/2022, 10:55 AM

## 2022-05-26 ENCOUNTER — Other Ambulatory Visit: Payer: Self-pay

## 2022-05-26 ENCOUNTER — Encounter (HOSPITAL_BASED_OUTPATIENT_CLINIC_OR_DEPARTMENT_OTHER): Payer: Self-pay | Admitting: Surgery

## 2022-05-26 NOTE — Progress Notes (Signed)
   05/26/22 1331  PAT Phone Screen  Is the patient taking a GLP-1 receptor agonist? No  Do You Have Diabetes? No  Do You Have Hypertension? No  Have You Ever Been to the ER for Asthma? No  Have You Taken Oral Steroids in the Past 3 Months? No  Do you Take Phenteramine or any Other Diet Drugs? No  Recent  Lab Work, EKG, CXR? Yes  Where was this test performed? EKG 11/22/21  Do you have a history of heart problems? Yes  Cardiologist Name Glenetta Hew, MD  Have you ever had tests on your heart? Yes  What cardiac tests were performed? Echo  What date/year were cardiac tests completed? ECHO 01/01/20 EF 40-45%  Results viewable: [facility] Media Tab  Any Recent Hospitalizations? No  Height 6' (1.829 m)  Weight 94.7 kg  Pat Appointment Scheduled No  Reason for No Appointment Not Needed

## 2022-05-30 NOTE — Progress Notes (Signed)

## 2022-06-02 ENCOUNTER — Telehealth: Payer: Self-pay | Admitting: Cardiology

## 2022-06-02 NOTE — Telephone Encounter (Signed)
Spoke with patient wife.  They are taking his BP back to back while o phone.  Advised to stop taking the BP at this time as it will show higher each time.  Patient has not taken his BP medication so advised him to take that and his morning meds now.  Patient states dizziness was really only when he stood up to get out of bed and was better now.  He states that no blurred vision, that was not a problem. No headache or other symptoms.  Advised take BP medication and recheck In 30 minutes to ensure it is not continuing to increase and see if it is reducing the BP.  Noted that these numbers are not alarming and we do expect a little higher in numbers before medication than we do after.  They are concerned because he has surgery tomorrow. Advised also to check with surgery instructions to be sure he is OK to take BP medication with small sip of water prior to surgery.  Will call patient back in 30 minutes to assess.  Call back to patient  He took meds at last call and just rechecked BP while I spoke to his wife.  His BP was 134/78.  No dizziness, or any other symptoms.  Will call if any further concerns.

## 2022-06-02 NOTE — Telephone Encounter (Signed)
Intermittent blood pressures in this range are okay.  If these tend to be averaging in this level, then we will probably increase his Entresto dose and follow-up visit.  Pressure is over 160 consistently need to be treated sooner rather than later, but it intermittent pressures in the 130-140 range can be monitored and treated at follow-up visit.  He should be due for follow-up soon anyway.  [place]

## 2022-06-02 NOTE — Telephone Encounter (Signed)
Pt c/o BP issue: STAT if pt c/o blurred vision, one-sided weakness or slurred speech  1. What are your last 5 BP readings? 141/87, 149/87, now 150/88  2. Are you having any other symptoms (ex. Dizziness, headache, blurred vision, passed out)? Lightheaded and bit of blurred vision  3. What is your BP issue? Pt has surgery scheduled in the morning and BP is climbing.

## 2022-06-03 ENCOUNTER — Ambulatory Visit (HOSPITAL_BASED_OUTPATIENT_CLINIC_OR_DEPARTMENT_OTHER)
Admission: RE | Admit: 2022-06-03 | Discharge: 2022-06-03 | Disposition: A | Discharge status: Home / Self Care | Payer: 59 | Source: Ambulatory Visit | Attending: Surgery | Admitting: Surgery

## 2022-06-03 ENCOUNTER — Other Ambulatory Visit: Payer: Self-pay

## 2022-06-03 ENCOUNTER — Encounter (HOSPITAL_BASED_OUTPATIENT_CLINIC_OR_DEPARTMENT_OTHER)
Admission: RE | Disposition: A | Discharge status: Home / Self Care | Payer: Self-pay | Source: Ambulatory Visit | Attending: Surgery

## 2022-06-03 ENCOUNTER — Ambulatory Visit (HOSPITAL_BASED_OUTPATIENT_CLINIC_OR_DEPARTMENT_OTHER): Payer: 59 | Admitting: Anesthesiology

## 2022-06-03 ENCOUNTER — Encounter (HOSPITAL_BASED_OUTPATIENT_CLINIC_OR_DEPARTMENT_OTHER): Payer: Self-pay | Admitting: Surgery

## 2022-06-03 DIAGNOSIS — I10 Essential (primary) hypertension: Secondary | ICD-10-CM | POA: Diagnosis not present

## 2022-06-03 DIAGNOSIS — K4090 Unilateral inguinal hernia, without obstruction or gangrene, not specified as recurrent: Secondary | ICD-10-CM | POA: Diagnosis present

## 2022-06-03 DIAGNOSIS — I252 Old myocardial infarction: Secondary | ICD-10-CM | POA: Insufficient documentation

## 2022-06-03 DIAGNOSIS — Z87891 Personal history of nicotine dependence: Secondary | ICD-10-CM

## 2022-06-03 DIAGNOSIS — I2510 Atherosclerotic heart disease of native coronary artery without angina pectoris: Secondary | ICD-10-CM | POA: Insufficient documentation

## 2022-06-03 DIAGNOSIS — I509 Heart failure, unspecified: Secondary | ICD-10-CM | POA: Insufficient documentation

## 2022-06-03 DIAGNOSIS — Z955 Presence of coronary angioplasty implant and graft: Secondary | ICD-10-CM | POA: Diagnosis not present

## 2022-06-03 DIAGNOSIS — I110 Hypertensive heart disease with heart failure: Secondary | ICD-10-CM | POA: Diagnosis not present

## 2022-06-03 HISTORY — PX: INGUINAL HERNIA REPAIR: SHX194

## 2022-06-03 LAB — GLUCOSE, CAPILLARY: Glucose-Capillary: 130 mg/dL — ABNORMAL HIGH (ref 70–99)

## 2022-06-03 SURGERY — REPAIR, HERNIA, INGUINAL, ADULT
Anesthesia: Regional | Site: Groin | Laterality: Left

## 2022-06-03 MED ORDER — CHLORHEXIDINE GLUCONATE CLOTH 2 % EX PADS: 6.0000 | MEDICATED_PAD | Freq: Once | CUTANEOUS | Status: DC

## 2022-06-03 MED ORDER — METHOCARBAMOL 750 MG PO TABS: 750.0000 mg | ORAL_TABLET | Freq: Three times a day (TID) | ORAL | 0 refills | Status: DC | PRN

## 2022-06-03 MED ORDER — ROCURONIUM BROMIDE 10 MG/ML (PF) SYRINGE
PREFILLED_SYRINGE | INTRAVENOUS | Status: AC
  Filled 2022-06-03: qty 10

## 2022-06-03 MED ORDER — LACTATED RINGERS IV SOLN: INTRAVENOUS | Status: DC | PRN

## 2022-06-03 MED ORDER — FENTANYL CITRATE (PF) 100 MCG/2ML IJ SOLN: 25.0000 ug | INTRAMUSCULAR | Status: DC | PRN

## 2022-06-03 MED ORDER — ACETAMINOPHEN 10 MG/ML IV SOLN: 1000.0000 mg | Freq: Once | INTRAVENOUS | Status: DC | PRN

## 2022-06-03 MED ORDER — MIDAZOLAM HCL 2 MG/2ML IJ SOLN
INTRAMUSCULAR | Status: AC
  Filled 2022-06-03: qty 2

## 2022-06-03 MED ORDER — CEFAZOLIN SODIUM-DEXTROSE 2-4 GM/100ML-% IV SOLN
INTRAVENOUS | Status: AC
  Filled 2022-06-03: qty 100

## 2022-06-03 MED ORDER — SUGAMMADEX SODIUM 200 MG/2ML IV SOLN
INTRAVENOUS | Status: DC | PRN
  Administered 2022-06-03: 200 mg via INTRAVENOUS

## 2022-06-03 MED ORDER — FENTANYL CITRATE (PF) 100 MCG/2ML IJ SOLN
INTRAMUSCULAR | Status: AC
  Filled 2022-06-03: qty 2

## 2022-06-03 MED ORDER — FENTANYL CITRATE (PF) 100 MCG/2ML IJ SOLN
INTRAMUSCULAR | Status: DC | PRN
  Administered 2022-06-03: 100 ug via INTRAVENOUS

## 2022-06-03 MED ORDER — DEXAMETHASONE SODIUM PHOSPHATE 10 MG/ML IJ SOLN
INTRAMUSCULAR | Status: DC | PRN
  Administered 2022-06-03: 5 mg via INTRAVENOUS

## 2022-06-03 MED ORDER — MIDAZOLAM HCL 2 MG/2ML IJ SOLN
INTRAMUSCULAR | Status: DC | PRN
  Administered 2022-06-03: 2 mg via INTRAVENOUS

## 2022-06-03 MED ORDER — LIDOCAINE HCL (CARDIAC) PF 100 MG/5ML IV SOSY
PREFILLED_SYRINGE | INTRAVENOUS | Status: DC | PRN
  Administered 2022-06-03: 80 mg via INTRAVENOUS

## 2022-06-03 MED ORDER — CEFAZOLIN SODIUM-DEXTROSE 2-4 GM/100ML-% IV SOLN
2.0000 g | INTRAVENOUS | Status: AC
  Administered 2022-06-03: 2 g via INTRAVENOUS

## 2022-06-03 MED ORDER — OXYCODONE HCL 5 MG/5ML PO SOLN: 5.0000 mg | Freq: Once | ORAL | Status: AC | PRN

## 2022-06-03 MED ORDER — BUPIVACAINE HCL (PF) 0.25 % IJ SOLN
INTRAMUSCULAR | Status: DC | PRN
  Administered 2022-06-03: 20 mL

## 2022-06-03 MED ORDER — PROPOFOL 10 MG/ML IV BOLUS
INTRAVENOUS | Status: DC | PRN
  Administered 2022-06-03: 180 mg via INTRAVENOUS

## 2022-06-03 MED ORDER — OXYCODONE HCL 5 MG PO TABS
5.0000 mg | ORAL_TABLET | Freq: Once | ORAL | Status: AC | PRN
  Administered 2022-06-03: 5 mg via ORAL

## 2022-06-03 MED ORDER — FENTANYL CITRATE (PF) 100 MCG/2ML IJ SOLN
100.0000 ug | Freq: Once | INTRAMUSCULAR | Status: AC
  Administered 2022-06-03: 100 ug via INTRAVENOUS

## 2022-06-03 MED ORDER — ONDANSETRON HCL 4 MG/2ML IJ SOLN
INTRAMUSCULAR | Status: DC | PRN
  Administered 2022-06-03: 4 mg via INTRAVENOUS

## 2022-06-03 MED ORDER — OXYCODONE HCL 5 MG PO TABS: 5.0000 mg | ORAL_TABLET | Freq: Four times a day (QID) | ORAL | 0 refills | Status: DC | PRN

## 2022-06-03 MED ORDER — ROPIVACAINE HCL 5 MG/ML IJ SOLN
INTRAMUSCULAR | Status: DC | PRN
  Administered 2022-06-03: 30 mL

## 2022-06-03 MED ORDER — ONDANSETRON HCL 4 MG/2ML IJ SOLN
INTRAMUSCULAR | Status: AC
  Filled 2022-06-03: qty 2

## 2022-06-03 MED ORDER — PROPOFOL 10 MG/ML IV BOLUS
INTRAVENOUS | Status: AC
  Filled 2022-06-03: qty 20

## 2022-06-03 MED ORDER — LACTATED RINGERS IV SOLN: INTRAVENOUS | Status: DC

## 2022-06-03 MED ORDER — DEXAMETHASONE SODIUM PHOSPHATE 10 MG/ML IJ SOLN
INTRAMUSCULAR | Status: DC | PRN
  Administered 2022-06-03: 10 mg

## 2022-06-03 MED ORDER — OXYCODONE HCL 5 MG PO TABS
ORAL_TABLET | ORAL | Status: AC
  Filled 2022-06-03: qty 1

## 2022-06-03 MED ORDER — MIDAZOLAM HCL 2 MG/2ML IJ SOLN
2.0000 mg | Freq: Once | INTRAMUSCULAR | Status: AC
  Administered 2022-06-03: 2 mg via INTRAVENOUS

## 2022-06-03 MED ORDER — DEXAMETHASONE SODIUM PHOSPHATE 10 MG/ML IJ SOLN
INTRAMUSCULAR | Status: AC
  Filled 2022-06-03: qty 1

## 2022-06-03 MED ORDER — ROCURONIUM 10MG/ML (10ML) SYRINGE FOR MEDFUSION PUMP - OPTIME
INTRAVENOUS | Status: DC | PRN
  Administered 2022-06-03: 50 mg via INTRAVENOUS

## 2022-06-03 SURGICAL SUPPLY — 51 items
ADH SKN CLS APL DERMABOND .7 (GAUZE/BANDAGES/DRESSINGS) ×1
APL PRP STRL LF DISP 70% ISPRP (MISCELLANEOUS) ×1
BLADE CLIPPER SURG (BLADE) IMPLANT
BLADE SURG 15 STRL LF DISP TIS (BLADE) ×1 IMPLANT
BLADE SURG 15 STRL SS (BLADE) ×1
CANISTER SUCT 1200ML W/VALVE (MISCELLANEOUS) IMPLANT
CHLORAPREP W/TINT 26 (MISCELLANEOUS) ×1 IMPLANT
COVER BACK TABLE 60X90IN (DRAPES) ×1 IMPLANT
COVER MAYO STAND STRL (DRAPES) ×1 IMPLANT
DERMABOND ADVANCED .7 DNX12 (GAUZE/BANDAGES/DRESSINGS) ×1 IMPLANT
DRAIN PENROSE .5X12 LATEX STL (DRAIN) ×1 IMPLANT
DRAPE LAPAROTOMY TRNSV 102X78 (DRAPES) ×1 IMPLANT
DRAPE UTILITY XL STRL (DRAPES) ×1 IMPLANT
ELECT COATED BLADE 2.86 ST (ELECTRODE) ×1 IMPLANT
ELECT REM PT RETURN 9FT ADLT (ELECTROSURGICAL) ×1
ELECTRODE REM PT RTRN 9FT ADLT (ELECTROSURGICAL) ×1 IMPLANT
GAUZE 4X4 16PLY ~~LOC~~+RFID DBL (SPONGE) IMPLANT
GAUZE SPONGE 4X4 12PLY STRL LF (GAUZE/BANDAGES/DRESSINGS) IMPLANT
GLOVE BIOGEL PI IND STRL 7.0 (GLOVE) IMPLANT
GLOVE BIOGEL PI IND STRL 7.5 (GLOVE) IMPLANT
GLOVE BIOGEL PI IND STRL 8 (GLOVE) ×1 IMPLANT
GLOVE ECLIPSE 8.0 STRL XLNG CF (GLOVE) ×1 IMPLANT
GLOVE SURG SS PI 7.0 STRL IVOR (GLOVE) IMPLANT
GOWN STRL REUS W/ TWL LRG LVL3 (GOWN DISPOSABLE) ×2 IMPLANT
GOWN STRL REUS W/ TWL XL LVL3 (GOWN DISPOSABLE) ×1 IMPLANT
GOWN STRL REUS W/TWL LRG LVL3 (GOWN DISPOSABLE) ×2
GOWN STRL REUS W/TWL XL LVL3 (GOWN DISPOSABLE) ×1
MESH HERNIA SYS ULTRAPRO LRG (Mesh General) IMPLANT
NDL HYPO 25X1 1.5 SAFETY (NEEDLE) ×1 IMPLANT
NEEDLE HYPO 25X1 1.5 SAFETY (NEEDLE) ×1 IMPLANT
NS IRRIG 1000ML POUR BTL (IV SOLUTION) IMPLANT
PACK BASIN DAY SURGERY FS (CUSTOM PROCEDURE TRAY) ×1 IMPLANT
PENCIL SMOKE EVACUATOR (MISCELLANEOUS) ×1 IMPLANT
SLEEVE SCD COMPRESS KNEE MED (STOCKING) ×1 IMPLANT
SPIKE FLUID TRANSFER (MISCELLANEOUS) IMPLANT
SPONGE T-LAP 4X18 ~~LOC~~+RFID (SPONGE) ×1 IMPLANT
STRIP CLOSURE SKIN 1/2X4 (GAUZE/BANDAGES/DRESSINGS) IMPLANT
SUT MON AB 4-0 PC3 18 (SUTURE) ×1 IMPLANT
SUT NOVA 0 T19/GS 22DT (SUTURE) IMPLANT
SUT NOVA NAB DX-16 0-1 5-0 T12 (SUTURE) ×2 IMPLANT
SUT VIC AB 2-0 SH 27 (SUTURE) ×1
SUT VIC AB 2-0 SH 27XBRD (SUTURE) ×1 IMPLANT
SUT VIC AB 3-0 54X BRD REEL (SUTURE) IMPLANT
SUT VIC AB 3-0 BRD 54 (SUTURE)
SUT VICRYL 3-0 CR8 SH (SUTURE) ×1 IMPLANT
SUT VICRYL AB 2 0 TIE (SUTURE) IMPLANT
SUT VICRYL AB 2 0 TIES (SUTURE)
SYR CONTROL 10ML LL (SYRINGE) ×1 IMPLANT
TOWEL GREEN STERILE FF (TOWEL DISPOSABLE) ×1 IMPLANT
TUBE CONNECTING 20X1/4 (TUBING) IMPLANT
YANKAUER SUCT BULB TIP NO VENT (SUCTIONS) IMPLANT

## 2022-06-03 NOTE — Op Note (Signed)
Left inguinal Hernia, Open, Procedure Note with mesh   Indications: The patient presented with a history of a left, reducible  inguinal hernia..  This was causing pain and he desired repair.  Risks and benefits of hernia surgery reviewed.  Laparoscopic and open techniques reviewed with mesh use and both.  Complications of mesh use were reviewed as well as chronic pain.The risk of hernia repair include bleeding,  Infection,   Recurrence of the hernia,  Mesh use, chronic pain,  Organ injury,  Bowel injury,  Bladder injury,   nerve injury with numbness around the incision,  Death,  and worsening of preexisting  medical problems.  The alternatives to surgery have been discussed as well..  Long term expectations of both operative and non operative treatments have been discussed.   The patient agrees to proceed.   Pre-operative Diagnosis: left reducible indirect inguinal hernia  Post-operative Diagnosis: same  Surgeon: Turner Daniels MD  Assistants: None  Anesthesia: General endotracheal anesthesia, Local anesthesia 0.25.% bupivacaine, and transversus abdominis block  ASA Class: 2  Procedure Details  The patient was seen again in the Holding Room. The risks, benefits, complications, treatment options, and expected outcomes were discussed with the patient. The possibilities of reaction to medication, pulmonary aspiration, perforation of viscus, bleeding, recurrent infection, the need for additional procedures, and development of a complication requiring transfusion or further operation were discussed with the patient and/or family. There was concurrence with the proposed plan, and informed consent was obtained. The site of surgery was properly noted/marked. The patient was taken to the Operating Room, identified as Edward Pierce, and the procedure verified as hernia repair. A Time Out was held and the above information confirmed.  The patient was placed in the supine position and underwent induction of  anesthesia, the lower abdomen and groin was prepped and draped in the standard fashion, and 0.25% Marcaine with epinephrine was used to anesthetize the skin over the mid-portion of the inguinal canal. A transverse incision was made. Dissection was carried through the soft tissue to expose the inguinal canal and inguinal ligament along its lower edge. The external oblique fascia was split along the course of its fibers, exposing the inguinal canal. The cord and nerve were looped using a Penrose drain and reflected out of the field.  The ilioinguinal nerve was divided as it is the muscle to prevent postop entrapment by the mesh.  The indefect was exposed and contents were dissected off the cord and reduced back into the preperitoneal space.  A  piece of ultra Pro prolene hernia system was and placed in the indirect defect. Interupted 1-0 novafil suture was then used  to repair the defect, with the suture being sewn from the pubic tubercle inferiorly and superiorly along the canal to a level just beyond the internal ring. The mesh was split to allow passage of the cord into the canal without entrapment. The contents were then returned to canal and the external oblique fashion was then closed in a continuous fashion using 3-0 Vicryl suture taking care not to cause entrapment. Scarpa's layer closed with 3 0 vicryl and 4 0 monocryl used to close the skin.  Dermabond used for dressing.  Instrument, sponge, and needle counts were correct prior to closure and at the conclusion of the case.  Findings: Hernia as above  Estimated Blood Loss: Minimal         Drains: None         Total IV Fluids: 500 mL  Specimens: NONE               Complications: None; patient tolerated the procedure well.         Disposition: PACU - hemodynamically stable.         Condition: stable

## 2022-06-03 NOTE — Anesthesia Preprocedure Evaluation (Addendum)
Anesthesia Evaluation  Patient identified by MRN, date of birth, ID band Patient awake    Reviewed: Allergy & Precautions, NPO status , Patient's Chart, lab work & pertinent test results  Airway Mallampati: II  TM Distance: >3 FB Neck ROM: Full    Dental no notable dental hx.    Pulmonary former smoker   Pulmonary exam normal        Cardiovascular hypertension, Pt. on medications and Pt. on home beta blockers + CAD, + Past MI and + Cardiac Stents   Rhythm:Regular Rate:Normal  ECHO 2021:   1. Left ventricular ejection fraction, by estimation, is 40 to 45%. The  left ventricle has mildly decreased function. The left ventricle  demonstrates regional wall motion abnormalities (see scoring  diagram/findings for description). There is mild  concentric left ventricular hypertrophy. Left ventricular diastolic  parameters are indeterminate. There is akinesis of the left ventricular,  entire apical segment. There is akinesis of the left ventricular, mid  anteroseptal wall.   2. Right ventricular systolic function is normal. The right ventricular  size is normal.   3. The mitral valve is normal in structure. Trivial mitral valve  regurgitation. No evidence of mitral stenosis.   4. The aortic valve is normal in structure. Aortic valve regurgitation is  trivial. No aortic stenosis is present.   5. Aortic dilatation noted. There is mild dilatation of the aortic root,  measuring 39 mm.   6. The inferior vena cava is normal in size with greater than 50%  respiratory variability, suggesting right atrial pressure of 3 mmHg.   7. Recommend limited study with definity to rule out apical thromubs.     Neuro/Psych negative neurological ROS  negative psych ROS   GI/Hepatic Neg liver ROS,,,Left inguinal hernia    Endo/Other  negative endocrine ROS    Renal/GU negative Renal ROS  negative genitourinary   Musculoskeletal negative  musculoskeletal ROS (+)    Abdominal Normal abdominal exam  (+)   Peds  Hematology negative hematology ROS (+)   Anesthesia Other Findings   Reproductive/Obstetrics                             Anesthesia Physical Anesthesia Plan  ASA: 3  Anesthesia Plan: General and Regional   Post-op Pain Management: Regional block*   Induction: Intravenous  PONV Risk Score and Plan: 2 and Ondansetron, Dexamethasone, Midazolam and Treatment may vary due to age or medical condition  Airway Management Planned: Mask and LMA  Additional Equipment: None  Intra-op Plan:   Post-operative Plan: Extubation in OR  Informed Consent: I have reviewed the patients History and Physical, chart, labs and discussed the procedure including the risks, benefits and alternatives for the proposed anesthesia with the patient or authorized representative who has indicated his/her understanding and acceptance.     Dental advisory given  Plan Discussed with: CRNA  Anesthesia Plan Comments:        Anesthesia Quick Evaluation

## 2022-06-03 NOTE — Discharge Instructions (Addendum)
[facility] _______[facility]  UMBILICAL OR INGUINAL HERNIA REPAIR: POST OP INSTRUCTIONS  Always review your discharge instruction sheet given to you by the facility where your surgery was performed. IF YOU HAVE DISABILITY OR FAMILY LEAVE FORMS, YOU MUST BRING THEM TO THE OFFICE FOR PROCESSING.   DO NOT GIVE THEM TO YOUR DOCTOR.  1. A  prescription for pain medication may be given to you upon discharge.  Take your pain medication as prescribed, if needed.  If narcotic pain medicine is not needed, then you may take acetaminophen (Tylenol) or ibuprofen (Advil) as needed. 2. Take your usually prescribed medications unless otherwise directed. If you need a refill on your pain medication, please contact your pharmacy.  They will contact our office to request authorization. Prescriptions will not be filled after 5 pm or on week-ends. 3. You should follow a light diet the first 24 hours after arrival home, such as soup and crackers, etc.  Be sure to include lots of fluids daily.  Resume your normal diet the day after surgery. 4.Most patients will experience some swelling and bruising around the umbilicus or in the groin and scrotum.  Ice packs and reclining will help.  Swelling and bruising can take several days to resolve.  6. It is common to experience some constipation if taking pain medication after surgery.  Increasing fluid intake and taking a stool softener (such as Colace) will usually help or prevent this problem from occurring.  A mild laxative (Milk of Magnesia or Miralax) should be taken according to package directions if there are no bowel movements after 48 hours. 7. Unless discharge instructions indicate otherwise, you may remove your bandages 24-48 hours after surgery, and you may shower at that time.  You may have steri-strips (small skin tapes) in place directly over the incision.  These strips should be left on the skin for 7-10 days.  If your surgeon used skin glue on the  incision, you may shower in 24 hours.  The glue will flake off over the next 2-3 weeks.  Any sutures or staples will be removed at the office during your follow-up visit. 8. ACTIVITIES:  You may resume regular (light) daily activities beginning the next day--such as daily self-care, walking, climbing stairs--gradually increasing activities as tolerated.  You may have sexual intercourse when it is comfortable.  Refrain from any heavy lifting or straining until approved by your doctor.  a.You may drive when you are no longer taking prescription pain medication, you can comfortably wear a seatbelt, and you can safely maneuver your car and apply brakes. b.RETURN TO WORK:   _____________________________________________  9.You should see your doctor in the office for a follow-up appointment approximately 2-3 weeks after your surgery.  Make sure that you call for this appointment within a day or two after you arrive home to insure a convenient appointment time. 10.OTHER INSTRUCTIONS: _________________________    _____________________________________  WHEN TO CALL YOUR DOCTOR: Fever over 101.0 Inability to urinate Nausea and/or vomiting Extreme swelling or bruising Continued bleeding from incision. Increased pain, redness, or drainage from the incision  The clinic staff is available to answer your questions during regular business hours.  Please don't hesitate to call and ask to speak to one of the nurses for clinical concerns.  If you have a medical emergency, go to the nearest emergency room or call 911.  A surgeon from [facility] is always on call at the hospital   [address], [address],   [place], [place]  [address] ?  [address] [address], [place], [place]   [address] [phone] ? [phone] ? FAX [phone] Web site: [email or web address]  Post Anesthesia Home Care Instructions  Activity: Get plenty of rest for the remainder of the day. A responsible  individual must stay with you for 24 hours following the procedure.  For the next 24 hours, DO NOT: -Drive a car -Operate machinery -Drink alcoholic beverages -Take any medication unless instructed by your physician -Make any legal decisions or sign important papers.  Meals: Start with liquid foods such as gelatin or soup. Progress to regular foods as tolerated. Avoid greasy, spicy, heavy foods. If nausea and/or vomiting occur, drink only clear liquids until the nausea and/or vomiting subsides. Call your physician if vomiting continues.  Special Instructions/Symptoms: Your throat may feel dry or sore from the anesthesia or the breathing tube placed in your throat during surgery. If this causes discomfort, gargle with warm salt water. The discomfort should disappear within 24 hours.  If you had a scopolamine patch placed behind your ear for the management of post- operative nausea and/or vomiting:  1. The medication in the patch is effective for 72 hours, after which it should be removed.  Wrap patch in a tissue and discard in the trash. Wash hands thoroughly with soap and water. 2. You may remove the patch earlier than 72 hours if you experience unpleasant side effects which may include dry mouth, dizziness or visual disturbances. 3. Avoid touching the patch. Wash your hands with soap and water after contact with the patch.     

## 2022-06-03 NOTE — Anesthesia Procedure Notes (Signed)
Procedure Name: Intubation Date/Time: 06/03/2022 10:30 AM  Performed by: Verita Lamb, CRNAPre-anesthesia Checklist: Patient identified, Emergency Drugs available, Suction available and Patient being monitored Patient Re-evaluated:Patient Re-evaluated prior to induction Oxygen Delivery Method: Circle system utilized Preoxygenation: Pre-oxygenation with 100% oxygen Induction Type: IV induction Ventilation: Mask ventilation without difficulty Laryngoscope Size: Mac and 4 Grade View: Grade I Tube type: Oral Tube size: 7.5 mm Number of attempts: 1 Airway Equipment and Method: Stylet and Oral airway Placement Confirmation: ETT inserted through vocal cords under direct vision, positive ETCO2, breath sounds checked- equal and bilateral and CO2 detector Tube secured with: Tape Dental Injury: Teeth and Oropharynx as per pre-operative assessment

## 2022-06-03 NOTE — H&P (Signed)
History of Present Illness: Edward Pierce is a 56 y.o. male who is seen today as an office consultation for evaluation of New Consultation (left inguinal hernia)  Patient presents for evaluation of symptomatic left inguinal hernia. History of right inguinal hernia repaired over 20 years ago with mesh. He is developed more discomfort in his left groin and a small bulge especially standing all day. No change in bowel or bladder function.  Review of Systems: A complete review of systems was obtained from the patient. I have reviewed this information and discussed as appropriate with the patient. See HPI as well for other ROS.    Medical History: Past Medical History:  Diagnosis Date  CHF (congestive heart failure) (CMS-HCC)  DVT (deep venous thrombosis) (CMS-HCC)  History of myocardial infarction  Hyperlipidemia   There is no problem list on file for this patient.  Past Surgical History:  Procedure Laterality Date  hernai repair    No Known Allergies  Current Outpatient Medications on File Prior to Visit  Medication Sig Dispense Refill  acetaminophen (TYLENOL) 500 MG tablet Take by mouth  atorvastatin (LIPITOR) 20 MG tablet Take 20 mg by mouth at bedtime  BRILINTA 60 mg tablet Take 1 tablet by mouth 2 (two) times daily  cholecalciferol (VITAMIN D3) 10 mcg (400 unit) chewable tablet Take 5,000 Units by mouth once daily  ENTRESTO 24-26 mg tablet TAKE 1 TABLET BY MOUTH EVERY MORNING AND 2 TABLETS IN THE EVENING  JARDIANCE 10 mg tablet Take 1 tablet by mouth every morning before breakfast (0630)  loratadine (CLARITIN) 10 mg tablet Take by mouth  metoprolol succinate (TOPROL-XL) 25 MG XL tablet Take 0.5 tablets by mouth once daily  REPATHA SURECLICK [id number] mg/mL PnIj Inject subcutaneously   No current facility-administered medications on file prior to visit.   Family History  Problem Relation Age of Onset  Coronary Artery Disease (Blocked arteries around heart) Mother  High blood  pressure (Hypertension) Mother  Hyperlipidemia (Elevated cholesterol) Mother  Diabetes Mother  Breast cancer Mother  Coronary Artery Disease (Blocked arteries around heart) Father  Skin cancer Father  High blood pressure (Hypertension) Father  Hyperlipidemia (Elevated cholesterol) Father    Social History   Tobacco Use  Smoking Status Former  Types: Cigarettes  Smokeless Tobacco Never    Social History   Socioeconomic History  Marital status: Married  Tobacco Use  Smoking status: Former  Types: Cigarettes  Smokeless tobacco: Never  Vaping Use  Vaping Use: Never used  Substance and Sexual Activity  Alcohol use: Yes  Drug use: Never   Objective:   Vitals:  05/09/22 1356  BP: 122/80  Pulse: 64  Temp: 36.7 C (98.1 F)  SpO2: 98%  Weight: 94.3 kg (208 lb)  Height: 182.9 cm (6')  PainSc: 0-No pain   Body mass index is 28.21 kg/m.  Physical Exam HENT:  Nose: Nose normal.  Cardiovascular:  Rate and Rhythm: Normal rate.  Pulmonary:  Effort: Pulmonary effort is normal.  Breath sounds: No stridor.  Abdominal:  Hernia: A hernia is present. Hernia is present in the left inguinal area. There is no hernia in the left femoral area or right inguinal area.   Comments: Scar noted  LIH 2 cm reducible  Musculoskeletal:  General: Normal range of motion.  Skin: General: Skin is warm.  Neurological:  General: No focal deficit present.  Mental Status: He is alert.  Psychiatric:  Mood and Affect: Mood normal.  Behavior: Behavior normal.     Labs, Imaging and  Diagnostic Testing: Ultrasounds ordered by his PCP which shows a fat-containing 2 cm left inguinal hernia  Assessment and Plan:   Diagnoses and all orders for this visit:  Left inguinal hernia   Reviewed the pathophysiology of inguinal hernias. Discussed open and laparoscopic repairs. Discussed the pros and cons of each. Discussed use of mesh and chronic pain and other complications of mesh use.  Discussed the rationale for mesh use to reduce recurrence rates  He is opted for open repair of left inguinal hernia with mesh. Risks and benefits reviewed. Risk of bleeding, pain, recurrence, organ injury, major vascular injury and the need for the treatment standard procedures reviewed today.   Kennieth Francois, MD

## 2022-06-03 NOTE — Telephone Encounter (Signed)
Patient aware of Dr Ellyn Hack comments  He had hernia repair today and doing well.

## 2022-06-03 NOTE — Progress Notes (Signed)
Assisted Dr. Jana Half with left, transabdominal plane, ultrasound guided block. Side rails up, monitors on throughout procedure. See vital signs in flow sheet. Tolerated Procedure well.

## 2022-06-03 NOTE — Transfer of Care (Signed)
Immediate Anesthesia Transfer of Care Note  Patient: DEIONTA GAILLIARD  Procedure(s) Performed: LEFT INGUINAL HERNIA REPAIR WITH MESH (Left: Groin)  Patient Location: PACU  Anesthesia Type:General and Regional  Level of Consciousness: awake and alert   Airway & Oxygen Therapy: Patient Spontanous Breathing and Patient connected to face mask oxygen  Post-op Assessment: Report given to RN and Post -op Vital signs reviewed and stable  Post vital signs: Reviewed and stable  Last Vitals:  Vitals Value Taken Time  BP 148/90 06/03/22 1021  Temp 36.3 C 06/03/22 1021  Pulse 56 06/03/22 1028  Resp 14 06/03/22 1028  SpO2 97 % 06/03/22 1028  Vitals shown include unvalidated device data.  Last Pain:  Vitals:   06/03/22 0715  TempSrc: Oral  PainSc: 0-No pain      Patients Stated Pain Goal: 3 ([id number])  Complications: No notable events documented.

## 2022-06-03 NOTE — Anesthesia Postprocedure Evaluation (Signed)
Anesthesia Post Note  Patient: ARIQ VANDUSER  Procedure(s) Performed: LEFT INGUINAL HERNIA REPAIR WITH MESH (Left: Groin)     Patient location during evaluation: PACU Anesthesia Type: Regional and General Level of consciousness: awake and alert Pain management: pain level controlled Vital Signs Assessment: post-procedure vital signs reviewed and stable Respiratory status: spontaneous breathing, nonlabored ventilation, respiratory function stable and patient connected to nasal cannula oxygen Cardiovascular status: blood pressure returned to baseline and stable Postop Assessment: no apparent nausea or vomiting Anesthetic complications: no   No notable events documented.  Last Vitals:  Vitals:   06/03/22 1100 06/03/22 1120  BP: (!) 142/83 135/84  Pulse: (!) 47 (!) 50  Resp: 17 18  Temp:  (!) 36.3 C  SpO2: 98% 100%    Last Pain:  Vitals:   06/03/22 1131  TempSrc:   PainSc: 4                  Celes Dedic P Ashtan Laton

## 2022-06-03 NOTE — Interval H&P Note (Signed)
History and Physical Interval Note:  06/03/2022 8:30 AM  Edward Pierce  has presented today for surgery, with the diagnosis of LEFT INGUINAL HERNIA.  The various methods of treatment have been discussed with the patient and family. After consideration of risks, benefits and other options for treatment, the patient has consented to  Procedure(s) with comments: LEFT INGUINAL HERNIA REPAIR WITH MESH (Left) - TAP BLOCK as a surgical intervention.  The patient's history has been reviewed, patient examined, no change in status, stable for surgery.  I have reviewed the patient's chart and labs.  Questions were answered to the patient's satisfaction.    The risk of hernia repair include bleeding,  Infection,   Recurrence of the hernia,  Mesh use, chronic pain,  Organ injury,  Bowel injury,  Bladder injury,   nerve injury with numbness around the incision,  Death,  and worsening of preexisting  medical problems.  The alternatives to surgery have been discussed as well..  Long term expectations of both operative and non operative treatments have been discussed.   The patient agrees to proceed.  [place]

## 2022-06-03 NOTE — Anesthesia Procedure Notes (Signed)
Anesthesia Regional Block: TAP block   Pre-Anesthetic Checklist: , timeout performed,  Correct Patient, Correct Site, Correct Laterality,  Correct Procedure, Correct Position, site marked,  Risks and benefits discussed,  Surgical consent,  Pre-op evaluation,  At surgeon's request and post-op pain management  Laterality: Left  Prep: Dura Prep       Needles:  Injection technique: Single-shot  Needle Type: Echogenic Stimulator Needle     Needle Length: 10cm  Needle Gauge: 20     Additional Needles:   Procedures:,,,, ultrasound used (permanent image in chart),,    Narrative:  Start time: 06/03/2022 8:10 AM End time: 06/03/2022 8:15 AM Injection made incrementally with aspirations every 5 mL.  Performed by: Personally  Anesthesiologist: Darral Dash, DO  Additional Notes: Patient identified. Risks/Benefits/Options discussed with patient including but not limited to bleeding, infection, nerve damage, failed block, incomplete pain control. Patient expressed understanding and wished to proceed. All questions were answered. Sterile technique was used throughout the entire procedure. Please see nursing notes for vital signs. Aspirated in 5cc intervals with injection for negative confirmation. Patient was given instructions on fall risk and not to get out of bed. All questions and concerns addressed with instructions to call with any issues or inadequate analgesia.

## 2022-06-06 ENCOUNTER — Encounter (HOSPITAL_BASED_OUTPATIENT_CLINIC_OR_DEPARTMENT_OTHER): Payer: Self-pay | Admitting: Surgery

## 2022-06-22 ENCOUNTER — Other Ambulatory Visit: Payer: Self-pay | Admitting: Cardiology

## 2022-06-22 DIAGNOSIS — E785 Hyperlipidemia, unspecified: Secondary | ICD-10-CM

## 2022-07-22 ENCOUNTER — Other Ambulatory Visit (HOSPITAL_COMMUNITY): Payer: Self-pay

## 2022-07-22 ENCOUNTER — Telehealth: Payer: Self-pay

## 2022-07-22 NOTE — Telephone Encounter (Signed)
Pharmacy Patient Advocate Encounter   Received notification from [facility] that prior authorization for REPATHA 140MG /ML is required/requested.   PA submitted on 5.10.24 to (ins) [employer] via CoverMyMeds  Key or (Medicaid) confirmation # [id number]  Status is pending

## 2022-07-25 NOTE — Telephone Encounter (Signed)
Pharmacy Patient Advocate Encounter  Prior Authorization for Repatha 140mg /ml has been approved by [facility] (ins).    PA # [id number]   Effective dates: 5.10.24 through 07/22/23

## 2022-08-10 ENCOUNTER — Encounter: Payer: Self-pay | Admitting: Gastroenterology

## 2022-09-13 ENCOUNTER — Other Ambulatory Visit: Payer: Self-pay | Admitting: Cardiology

## 2022-09-13 DIAGNOSIS — I255 Ischemic cardiomyopathy: Secondary | ICD-10-CM

## 2022-09-13 DIAGNOSIS — I25119 Atherosclerotic heart disease of native coronary artery with unspecified angina pectoris: Secondary | ICD-10-CM

## 2022-09-13 DIAGNOSIS — Z955 Presence of coronary angioplasty implant and graft: Secondary | ICD-10-CM

## 2022-10-14 ENCOUNTER — Other Ambulatory Visit: Payer: Self-pay | Admitting: Family Medicine

## 2022-10-14 DIAGNOSIS — E785 Hyperlipidemia, unspecified: Secondary | ICD-10-CM

## 2022-10-14 DIAGNOSIS — Z0000 Encounter for general adult medical examination without abnormal findings: Secondary | ICD-10-CM

## 2022-10-21 ENCOUNTER — Other Ambulatory Visit: Payer: 59

## 2022-10-21 DIAGNOSIS — E785 Hyperlipidemia, unspecified: Secondary | ICD-10-CM

## 2022-10-21 DIAGNOSIS — Z0000 Encounter for general adult medical examination without abnormal findings: Secondary | ICD-10-CM

## 2022-10-28 ENCOUNTER — Encounter: Payer: 59 | Admitting: Nurse Practitioner

## 2022-10-31 ENCOUNTER — Encounter: Payer: Self-pay | Admitting: Family Medicine

## 2022-10-31 ENCOUNTER — Ambulatory Visit (INDEPENDENT_AMBULATORY_CARE_PROVIDER_SITE_OTHER): Payer: 59 | Admitting: Family Medicine

## 2022-10-31 VITALS — BP 108/63 | HR 66 | Ht 72.0 in | Wt 203.0 lb

## 2022-10-31 DIAGNOSIS — R197 Diarrhea, unspecified: Secondary | ICD-10-CM | POA: Diagnosis not present

## 2022-10-31 DIAGNOSIS — E781 Pure hyperglyceridemia: Secondary | ICD-10-CM

## 2022-10-31 NOTE — Patient Instructions (Signed)
It was nice to see you today,  We addressed the following topics today: -Your triglycerides and your A1c was slightly elevated.  Both of these can be improved with decreasing your carbohydrate intake.  This includes pastas, breads, potatoes, other starches, and drinks with sugar in it.  You can talk with your cardiologist next time you see him about starting a triglyceride medication if necessary but your levels were not so high that we would start a medication today - For diarrhea, I would use Metamucil to help bulk your stool.  You can also try an over-the-counter vitamin D and calcium supplement but sometimes helps with loose stools.  I would try to illuminate certain foods such as dairy or gluten systematically to see if this improves your bowel movements. - It should be okay to take the half a tablet twice a day of your antidiarrheal medication - Call your gastroenterologist to see if you are due for a colonoscopy soon.  They stated they wanted to see you back in 5 years and that was 2019.  Have a great day,  Frederic Jericho, MD

## 2022-10-31 NOTE — Progress Notes (Unsigned)
   Established Patient Office Visit  Subjective   Patient ID: Edward Pierce, male    DOB: October 01, 1965  Age: 57 y.o. MRN: [id number]  No chief complaint on file.   HPI  Triglycerides elevated, A1c 5.7 - how much sugar?   HFrEF -patient has an appoint with his cardiologist on October 2.  No concerns.  Tolerating his medicines just fine.  Colonoscopy-patient states he recently with received a letter that stated he did not need to get a colonoscopy at this time.  We discussed his previous letter from 2019 that recommended a 5-year follow-up.  Diarrhea-patient has 2-3 loose bowel movements per day.  He takes over-the-counter loperamide half a tablet twice a day.  We discussed using fiber to help with diarrhea as well as other dietary modifications.  Elevated triglycerides-we discussed the patient's triglyceride levels being elevated and ways to decrease this including reducing excess carbohydrate intake.  Patient also discussed improving his diet, goal of weight loss of about 10 pounds, and exercising more often.  Patient does not smoke.  Drinks alcohol occasionally.  Patient recently had a hernia repair surgery with [place] surgery.  The ASCVD Risk score (Arnett DK, et al., 2019) failed to calculate for the following reasons:   The patient has a prior MI or stroke diagnosis  Health Maintenance Due  Topic Date Due   Hepatitis C Screening  Never done   Zoster Vaccines- Shingrix (1 of 2) Never done   DTaP/Tdap/Td (2 - Td or Tdap) 08/21/2019   COVID-19 Vaccine (3 - Pfizer risk series) 12/31/2019   INFLUENZA VACCINE  10/13/2022   Colonoscopy  11/01/2022      Objective:     There were no vitals taken for this visit. {Vitals History (Optional):23777}  Physical Exam General: Alert, oriented HEENT: PERRLA, EOMI, moist mucosa CV: Regular rate and rhythm no murmurs Pulmonary: Lungs clear bilaterally Extremities: No pedal edema MSK: Strength equal bilaterally Psych: Pleasant  affect   No results found for any visits on 10/31/22.      Assessment & Plan:   There are no diagnoses linked to this encounter.   No follow-ups on file.    Sandre Kitty, MD

## 2022-11-01 ENCOUNTER — Other Ambulatory Visit: Payer: Self-pay | Admitting: Family Medicine

## 2022-11-02 DIAGNOSIS — R197 Diarrhea, unspecified: Secondary | ICD-10-CM | POA: Insufficient documentation

## 2022-11-02 NOTE — Assessment & Plan Note (Signed)
On statin and Repatha both prescribed by his cardiologist.  Discussed how carbohydrates are related to both his elevated A1c and his elevated triglycerides.  Advised patient to discuss with his cardiologist if he would benefit from adding a fibrate in addition to dietary changes.

## 2022-11-02 NOTE — Assessment & Plan Note (Signed)
Patient has approximate 3 episodes a day of loose stools to diarrhea.  Discussed dietary changes.  Discussed using fiber supplementation.  Advised patient his half a tablet twice daily of loperamide is safe to use, but that other options including adding fiber or limiting the cause of the diarrhea is a better alternative.

## 2022-11-16 ENCOUNTER — Other Ambulatory Visit: Payer: Self-pay | Admitting: Cardiology

## 2022-11-18 ENCOUNTER — Ambulatory Visit: Payer: 59 | Admitting: Cardiology

## 2022-12-14 ENCOUNTER — Encounter: Payer: Self-pay | Admitting: Cardiology

## 2022-12-14 ENCOUNTER — Ambulatory Visit: Payer: 59 | Attending: Cardiology | Admitting: Cardiology

## 2022-12-14 VITALS — BP 102/62 | HR 58 | Ht 72.0 in | Wt 202.6 lb

## 2022-12-14 DIAGNOSIS — I5020 Unspecified systolic (congestive) heart failure: Secondary | ICD-10-CM

## 2022-12-14 DIAGNOSIS — I472 Ventricular tachycardia, unspecified: Secondary | ICD-10-CM | POA: Diagnosis not present

## 2022-12-14 DIAGNOSIS — I255 Ischemic cardiomyopathy: Secondary | ICD-10-CM | POA: Diagnosis not present

## 2022-12-14 DIAGNOSIS — I2510 Atherosclerotic heart disease of native coronary artery without angina pectoris: Secondary | ICD-10-CM | POA: Diagnosis not present

## 2022-12-14 DIAGNOSIS — K580 Irritable bowel syndrome with diarrhea: Secondary | ICD-10-CM

## 2022-12-14 DIAGNOSIS — I252 Old myocardial infarction: Secondary | ICD-10-CM

## 2022-12-14 DIAGNOSIS — E785 Hyperlipidemia, unspecified: Secondary | ICD-10-CM

## 2022-12-14 DIAGNOSIS — R030 Elevated blood-pressure reading, without diagnosis of hypertension: Secondary | ICD-10-CM

## 2022-12-14 MED ORDER — NITROGLYCERIN 0.4 MG SL SUBL: SUBLINGUAL_TABLET | SUBLINGUAL | 4 refills | Status: AC

## 2022-12-14 NOTE — Progress Notes (Signed)
Cardiology Office Note:  .   Date:  12/18/2022  ID:  Edward Pierce, DOB 09/13/1965, MRN [id number] PCP: Sandre Kitty, MD  [facility] Providers Cardiologist:  Bryan Lemma, MD     Chief Complaint  Patient presents with   Follow-up    Annual follow-up.  Doing well for   Coronary Artery Disease    No recurrent angina   Cardiomyopathy    No CHF symptoms.  NYHA class I    Patient Profile: .     CAIGE ALMEDA is a  57 y.o. male former smoker is a three-year post-heart attack survivor, has been managing his condition with a regimen of Repatha, Toprol, Brilinta, Entresto, and Jardiance. He presents here for annual follow-up at the request of Carlean Jews, NP.  Cardiac History: July 2021 in the setting of NSTEMI.  NSVT in the setting of anterior MI. CATH-PCI: mLAD 99-0% s/p DES.  =  After 1 year DAPT, on maintenance Brilinta 60 mg twice daily.   Echo in setting of non-STEMI:  EF 35%, mid to apical anteroseptal and inferoseptal akinesis, akinetic true apex, no evidence of LV thrombus, G1 DD, no significant valvular abnormalities.     D/c with LifeVest.   ICM/HFrEF repeat Echo in October 2021: EF improved to 40 to 45%, mid anteroseptal and apical akinesis consistent with LAD infarct. HTN, HLD  LANKFORD GUTZMER was last seen on November 22, 2021: He was doing very well.  He had had quite a stressful summer with his job with [employer].  He was doing a significant [place] extra work and was under lots of stress.  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  Despite that, he had not had any chest pain or pressure with rest or exertion.  No heart failure symptoms of PND orthopnea edema.  No palpitations.  Dizziness was notably improved since reducing Entresto dose in the morning.  Other than anxiety, was doing well. Tolerating Repatha.  No med changes made.     Subjective   INTERVAL HPI R  Jaw Cyst removal Early 2024 Left inguinal hernia repair on 06/03/2022  Discussed the use of AI scribe software for clinical note transcription with the patient, who gave verbal consent to proceed.  History of Present Illness   Hajime presents for annual f/u doing well with no major complaints.  Since his last visit, he underwent two surgeries, one for a hernia and another for an internal cyst on his jaw. Both surgeries were successful and have not resulted in any complications.    He has been active, including a trip to [place] where he did a lot of walking without experiencing any chest pain, pressure, tightness, or shortness of breath.  No PND/orthopnea or edema.  No rapid or irregular heartbeats / rhythms.  He occasionally experiences dizziness when standing up from a squatting position and nighttime. No syncope/near syncope or TIA/CVA/amaurosis fugax.    He continues to work at [employer], where he has been given additional help due to the workload. He has been trying to maintain his weight around 200 pounds, with a goal to reduce it to 195 pounds. cramps, but no pain. He also takes loperamide for irritable bowel syndrome.      Cardiovascular ROS: no chest pain or dyspnea on exertion negative for - edema, irregular heartbeat, orthopnea, palpitations, paroxysmal  nocturnal dyspnea, rapid heart rate, shortness of breath, or syncope/near syncope., TIA/amaurosis fugax; claudication  ROS:  Review of Systems - Negative except mild aches & pains, occasional orthostatic dizziness. Staying active     Objective  Family History - Patient's mother is diabetic - Patient's mother has mitral valve disease - Patient's father had a heart attack at age 63 - Patient's brother has irritable bowel syndrom  Studies Reviewed: Marland Kitchen   EKG Interpretation Date/Time:  Wednesday December 14 2022 09:05:34 EDT Ventricular Rate:  58 PR Interval:  154 QRS Duration:  82 QT Interval:  394 QTC Calculation: 386 R  Axis:   75  Text Interpretation: Sinus bradycardia When compared with ECG of 03-Oct-2019 06:51, ST no longer elevated in Anterior leads T wave inversion no longer evident in Anterolateral leads QT has shortened Confirmed by Bryan Lemma ([id number]) on 12/14/2022 9:33:37 AM     LABS (10/21/2022): Total cholesterol: 115 mg/dL ; Triglycerides: 252 mg/dL; HDL: 45 mg/dL; LDL: 32 mg/dL; B1Y: 7.8%  Cardiac Cath-PCI 10/02/2019: CULPRIT - 99% mLAD => Asp thrombectomy &DES PCI Synergy 3.0 x 23. 3.3 mm  DES PCI:  Diagnostic                                                                                    Intervention      TTE 12/2019: EF 40 - 45%. Mildly reduced LV fxn, apical & anterolateral akinesis. Normal RV function. Normal RAP. Mild aortic dilation -39 mm   Risk Assessment/Calculations:          Physical Exam:   VS:  BP 102/62 (BP Location: Left Arm, Patient Position: Sitting, Cuff Size: Large)   Pulse (!) 58   Ht 6' (1.829 m)   Wt 202 lb 9.6 oz (91.9 kg)   SpO2 96%   BMI 27.48 kg/m    Wt Readings from Last 3 Encounters:  12/14/22 202 lb 9.6 oz (91.9 kg)  10/31/22 203 lb (92.1 kg)  06/03/22 201 lb 8 oz (91.4 kg)    GEN: Well nourished, well developed in no acute distress; healthy-appearing.  Well-nourished and well-groomed. NECK: No JVD; No carotid bruits CARDIAC: Normal S1, S2; RRR, no murmurs, rubs, gallops RESPIRATORY:  Clear to auscultation without rales, wheezing or rhonchi ; nonlabored, good air movement. ABDOMEN: Soft, non-tender, non-distended EXTREMITIES:  No edema; No deformity      ASSESSMENT AND PLAN: .    Problem List Items Addressed This Visit       Cardiology Problems   Atherosclerosis of native coronary artery of native heart without angina pectoris - Primary (Chronic)    Single-vessel CAD with PCI to the LAD in the setting of anterior MI back in July 2021.  No recurrent angina since then.  Mildly reduced EF on echo with wall motion abnormality suggestive of  completed infarct.  Plan: Has not been able to tolerate high doses of medications because of low blood pressure. Continue current low-dose of Toprol XL 12.5 mg daily and Entresto 24-26 mg 1 tablet twice daily Continue Jardiance 10 mg daily along with atorvastatin 20 mg daily along with Repatha Remains on maintenance dose Brilinta 60 mg twice daily Okay to hold  Brilinta 5 to 7 days preop for surgical procedures. If bruising or bleeding comes to be significant, we could potentially switch to aspirin 81 mg monotherapy.      Relevant Medications   nitroGLYCERIN (NITROSTAT) 0.4 MG SL tablet   Other Relevant Orders   EKG 12-Lead (Completed)   HFrEF (heart failure with reduced ejection fraction) (HCC) (Chronic)    NYHA class I symptoms with minimal exertional dyspnea.  EF back to 40-45%. Euvolemic on exam.  No diuretic requirement on Jardiance and Entresto.  Plan: Continue current doses of Entresto, Toprol and Jardiance.       Relevant Medications   nitroGLYCERIN (NITROSTAT) 0.4 MG SL tablet   Other Relevant Orders   EKG 12-Lead (Completed)   Hyperlipidemia with target low density lipoprotein (LDL) cholesterol less than 55 mg/dL (Chronic)    We were unable to achieve target levels on statin alone.,   (10/21/2022): Total cholesterol: 115 mg/dL ; Triglycerides: 252 mg/dL; HDL: 45 mg/dL; LDL: 32 mg/dL; E4V: 4.0% Doing much better after the addition of Repatha.  Lipids look excellent.  Continue Repatha q 14d and atorvastatin 20 mg daily.      Relevant Medications   nitroGLYCERIN (NITROSTAT) 0.4 MG SL tablet   Ischemic cardiomyopathy (Chronic)    EF improved up to 40 and 45% from 30-35%.  NYHA class symptoms.  Low blood pressures have limited titration of Entresto and Toprol, but on stable doses and doing well.      Relevant Medications   nitroGLYCERIN (NITROSTAT) 0.4 MG SL tablet   Other Relevant Orders   EKG 12-Lead (Completed)   Sustained ventricular tachycardia (HCC)    Noted in  the setting of acute MI, no longer having recurrent symptoms.  He is on low-dose beta-blocker.      Relevant Medications   nitroGLYCERIN (NITROSTAT) 0.4 MG SL tablet     Other   Elevated blood pressure reading without diagnosis of hypertension (Chronic)    Not really hypertensive in fact blood pressure is limited her ability to titrate up Entresto or Toprol.      History of acute anterior wall MI (did not meet full STEMI criteria) (Chronic)    History of sizable anterior infarct noted on echo with EF having some improvement up to 40 to 45%.  Stable, no chest pain, pressure, or tightness. No shortness of breath. No palpitations or syncope. No edema. On stable regimen.      IBS (irritable bowel syndrome)    Managed with Loperamide half tablet twice daily. -Continue current regimen.      Relevant Medications   Loperamide-Simethicone 2-125 MG TABS        Dispo: Return in about 1 year (around 12/14/2023) for 1 Yr Follow-up. -Annual follow-up for cardiovascular health.   Total time spent: 20 min spent with patient + 15 min spent charting = 35 min      Signed, Marykay Lex, MD, MS Bryan Lemma, M.D., M.S. Interventional Cardiologist  [facility]  Pager # [phone] Phone # [phone] [address] [place], [place] [address]

## 2022-12-14 NOTE — Patient Instructions (Signed)

## 2022-12-18 ENCOUNTER — Other Ambulatory Visit: Payer: Self-pay | Admitting: Cardiology

## 2022-12-18 ENCOUNTER — Encounter: Payer: Self-pay | Admitting: Cardiology

## 2022-12-18 DIAGNOSIS — K589 Irritable bowel syndrome without diarrhea: Secondary | ICD-10-CM | POA: Insufficient documentation

## 2022-12-18 NOTE — Assessment & Plan Note (Signed)
EF improved up to 40 and 45% from 30-35%.  NYHA class symptoms.  Low blood pressures have limited titration of Entresto and Toprol, but on stable doses and doing well.

## 2022-12-18 NOTE — Assessment & Plan Note (Signed)
Noted in the setting of acute MI, no longer having recurrent symptoms.  He is on low-dose beta-blocker.

## 2022-12-18 NOTE — Assessment & Plan Note (Signed)
NYHA class I symptoms with minimal exertional dyspnea.  EF back to 40-45%. Euvolemic on exam.  No diuretic requirement on Jardiance and Entresto.  Plan: Continue current doses of Entresto, Toprol and Jardiance.

## 2022-12-18 NOTE — Assessment & Plan Note (Addendum)
Single-vessel CAD with PCI to the LAD in the setting of anterior MI back in July 2021.  No recurrent angina since then.  Mildly reduced EF on echo with wall motion abnormality suggestive of completed infarct.  Plan: Has not been able to tolerate high doses of medications because of low blood pressure. Continue current low-dose of Toprol XL 12.5 mg daily and Entresto 24-26 mg 1 tablet twice daily Continue Jardiance 10 mg daily along with atorvastatin 20 mg daily along with Repatha Remains on maintenance dose Brilinta 60 mg twice daily Okay to hold Brilinta 5 to 7 days preop for surgical procedures. If bruising or bleeding comes to be significant, we could potentially switch to aspirin 81 mg monotherapy.

## 2022-12-18 NOTE — Assessment & Plan Note (Signed)
Managed with Loperamide half tablet twice daily. -Continue current regimen.

## 2022-12-18 NOTE — Assessment & Plan Note (Addendum)
We were unable to achieve target levels on statin alone.,   (10/21/2022): Total cholesterol: 115 mg/dL ; Triglycerides: 252 mg/dL; HDL: 45 mg/dL; LDL: 32 mg/dL; W0J: 8.1% Doing much better after the addition of Repatha.  Lipids look excellent.  Continue Repatha q 14d and atorvastatin 20 mg daily.

## 2022-12-18 NOTE — Assessment & Plan Note (Signed)
Not really hypertensive in fact blood pressure is limited her ability to titrate up Entresto or Toprol.

## 2022-12-18 NOTE — Assessment & Plan Note (Signed)
History of sizable anterior infarct noted on echo with EF having some improvement up to 40 to 45%.  Stable, no chest pain, pressure, or tightness. No shortness of breath. No palpitations or syncope. No edema. On stable regimen.

## 2023-06-13 ENCOUNTER — Other Ambulatory Visit: Payer: Self-pay | Admitting: Cardiology

## 2023-07-03 ENCOUNTER — Encounter: Payer: Self-pay | Admitting: Cardiology

## 2023-07-03 MED ORDER — ENTRESTO 24-26 MG PO TABS: 1.0000 | ORAL_TABLET | Freq: Two times a day (BID) | ORAL | 3 refills | Status: DC

## 2023-07-09 ENCOUNTER — Other Ambulatory Visit: Payer: Self-pay | Admitting: Cardiology

## 2023-07-09 DIAGNOSIS — E785 Hyperlipidemia, unspecified: Secondary | ICD-10-CM

## 2023-09-18 ENCOUNTER — Telehealth: Admitting: Physician Assistant

## 2023-09-18 ENCOUNTER — Encounter: Payer: Self-pay | Admitting: Cardiology

## 2023-09-18 ENCOUNTER — Other Ambulatory Visit: Payer: Self-pay | Admitting: Cardiology

## 2023-09-18 DIAGNOSIS — Z955 Presence of coronary angioplasty implant and graft: Secondary | ICD-10-CM

## 2023-09-18 DIAGNOSIS — B029 Zoster without complications: Secondary | ICD-10-CM

## 2023-09-18 DIAGNOSIS — I255 Ischemic cardiomyopathy: Secondary | ICD-10-CM

## 2023-09-18 DIAGNOSIS — I25119 Atherosclerotic heart disease of native coronary artery with unspecified angina pectoris: Secondary | ICD-10-CM

## 2023-09-18 MED ORDER — VALACYCLOVIR HCL 1 G PO TABS: 1000.0000 mg | ORAL_TABLET | Freq: Three times a day (TID) | ORAL | 0 refills | Status: AC

## 2023-09-18 MED ORDER — GABAPENTIN 300 MG PO CAPS: 300.0000 mg | ORAL_CAPSULE | Freq: Three times a day (TID) | ORAL | 0 refills | Status: DC

## 2023-09-18 NOTE — Progress Notes (Signed)
 E-visit for Shingles   We are sorry that you are not feeling well. Here is how we plan to help!  Based on what you shared with me it looks like you have shingles.  Shingles or herpes zoster, is a common infection of the nerves.  It is a painful rash caused by the herpes zoster virus.  This is the same virus that causes chickenpox.  After a person has chickenpox, the virus remains inactive in the nerve cells.  Years later, the virus can become active again and travel to the skin.  It typically will appear on one side of the face or body.  Burning or shooting pain, tingling, or itching are early signs of the infection.  Blisters typically scab over in 7 to 10 days and clear up within 2-4 weeks. Shingles is only contagious to people that have never had the chickenpox, the chickenpox vaccine, or anyone who has a compromised immune system.  You should avoid contact with these type of people until your blisters scab over.  I have prescribed Valacyclovir 1g three times daily for 7 days and also Gabapentin 300mg  twice daily as needed for pain   HOME CARE: . Apply ice packs (wrapped in a thin towel), cool compresses, or soak in cool bath to help reduce pain. . Use calamine lotion to calm itchy skin. . Avoid scratching the rash. . Avoid direct sunlight.  GET HELP RIGHT AWAY IF: . Symptoms that don't away after treatment. . A rash or blisters near your eye. . Increased drainage, fever, or rash after treatment. . Severe pain that doesn't go away.   MAKE SURE YOU    Understand these instructions.  Will watch your condition.  Will get help right away if you are not doing well or get worse.  Thank you for choosing an e-visit. Your e-visit answers were reviewed by a board certified advanced clinical practitioner to complete your personal care plan. Depending upon the condition, your plan could have included both over the counter or prescription medications.  Please review your pharmacy choice.  Make sure the pharmacy is open so you can pick up prescription now. If there is a problem, you may contact your provider through [employer] and have the prescription routed to another pharmacy.  Your safety is important to [place]. If you have drug allergies check your prescription carefully.   For the next 24 hours you can use MyChart to ask questions about today's visit, request a non-urgent call back, or ask for a work or school excuse.  You will get an email in the next two days asking about your experience. I hope that your e-visit has been valuable and will speed your recovery

## 2023-09-18 NOTE — Progress Notes (Signed)
 I have spent 5 minutes in review of e-visit questionnaire, review and updating patient chart, medical decision making and response to patient.   Laure Kidney, PA-C

## 2023-10-14 ENCOUNTER — Other Ambulatory Visit: Payer: Self-pay | Admitting: Cardiology

## 2023-10-18 ENCOUNTER — Other Ambulatory Visit (HOSPITAL_COMMUNITY): Payer: Self-pay

## 2023-10-18 ENCOUNTER — Encounter: Payer: Self-pay | Admitting: Cardiology

## 2023-10-18 MED ORDER — SACUBITRIL-VALSARTAN 24-26 MG PO TABS
1.0000 | ORAL_TABLET | Freq: Two times a day (BID) | ORAL | 3 refills | Status: DC
  Filled 2023-10-18: qty 60, 30d supply, fill #0

## 2023-10-18 NOTE — Telephone Encounter (Signed)
 RN called patient informed patient  that [facility] at [facility]. Has his medication - Entresto  24/26 mg  in stock .  They can call and transfer Entresto  Rx to the pharmacy/  Patient is in agreement to transfer.   RN gave patient direction and the close time of [facility].  RN notified pharmacy to  contact [facility] and transfer medication.   RN discussed with patient having all medication transferred.  He only wanted transfer this one for now. He states he will be getting a new insurance in Sept. 2025.

## 2023-10-19 ENCOUNTER — Other Ambulatory Visit (HOSPITAL_COMMUNITY): Payer: Self-pay

## 2023-10-23 ENCOUNTER — Other Ambulatory Visit: Payer: Self-pay | Admitting: *Deleted

## 2023-10-23 DIAGNOSIS — E785 Hyperlipidemia, unspecified: Secondary | ICD-10-CM

## 2023-10-23 DIAGNOSIS — E559 Vitamin D deficiency, unspecified: Secondary | ICD-10-CM

## 2023-10-23 DIAGNOSIS — R030 Elevated blood-pressure reading, without diagnosis of hypertension: Secondary | ICD-10-CM

## 2023-10-23 DIAGNOSIS — Z131 Encounter for screening for diabetes mellitus: Secondary | ICD-10-CM

## 2023-10-24 ENCOUNTER — Other Ambulatory Visit: Payer: 59

## 2023-10-24 DIAGNOSIS — Z131 Encounter for screening for diabetes mellitus: Secondary | ICD-10-CM

## 2023-10-24 DIAGNOSIS — E785 Hyperlipidemia, unspecified: Secondary | ICD-10-CM

## 2023-10-24 DIAGNOSIS — E559 Vitamin D deficiency, unspecified: Secondary | ICD-10-CM

## 2023-10-24 DIAGNOSIS — R030 Elevated blood-pressure reading, without diagnosis of hypertension: Secondary | ICD-10-CM

## 2023-10-25 ENCOUNTER — Ambulatory Visit: Payer: Self-pay | Admitting: Family Medicine

## 2023-10-25 LAB — CBC WITH DIFFERENTIAL/PLATELET
Basophils Absolute: 0 x10E3/uL (ref 0.0–0.2)
Basos: 1 %
EOS (ABSOLUTE): 0.1 x10E3/uL (ref 0.0–0.4)
Eos: 3 %
Hematocrit: 47.8 % (ref 37.5–51.0)
Hemoglobin: 15.9 g/dL (ref 13.0–17.7)
Immature Grans (Abs): 0 x10E3/uL (ref 0.0–0.1)
Immature Granulocytes: 0 %
Lymphocytes Absolute: 1.7 x10E3/uL (ref 0.7–3.1)
Lymphs: 39 %
MCH: 31.1 pg (ref 26.6–33.0)
MCHC: 33.3 g/dL (ref 31.5–35.7)
MCV: 93 fL (ref 79–97)
Monocytes Absolute: 0.4 x10E3/uL (ref 0.1–0.9)
Monocytes: 9 %
Neutrophils Absolute: 2.2 x10E3/uL (ref 1.4–7.0)
Neutrophils: 47 %
Platelets: 186 x10E3/uL (ref 150–450)
RBC: 5.12 x10E6/uL (ref 4.14–5.80)
RDW: 13.2 % (ref 11.6–15.4)
WBC: 4.4 x10E3/uL (ref 3.4–10.8)

## 2023-10-25 LAB — COMPREHENSIVE METABOLIC PANEL WITH GFR
ALT: 22 IU/L (ref 0–44)
AST: 31 IU/L (ref 0–40)
Albumin: 4.3 g/dL (ref 3.8–4.9)
Alkaline Phosphatase: 71 IU/L (ref 44–121)
BUN/Creatinine Ratio: 18 (ref 9–20)
BUN: 16 mg/dL (ref 6–24)
Bilirubin Total: 0.6 mg/dL (ref 0.0–1.2)
CO2: 22 mmol/L (ref 20–29)
Calcium: 9.1 mg/dL (ref 8.7–10.2)
Chloride: 103 mmol/L (ref 96–106)
Creatinine, Ser: 0.87 mg/dL (ref 0.76–1.27)
Globulin, Total: 2.3 g/dL (ref 1.5–4.5)
Glucose: 92 mg/dL (ref 70–99)
Potassium: 4 mmol/L (ref 3.5–5.2)
Sodium: 138 mmol/L (ref 134–144)
Total Protein: 6.6 g/dL (ref 6.0–8.5)
eGFR: 100 mL/min/1.73 (ref >59)

## 2023-10-25 LAB — HEMOGLOBIN A1C
Est. average glucose Bld gHb Est-mCnc: 120 mg/dL
Hgb A1c MFr Bld: 5.8 % — ABNORMAL HIGH (ref 4.8–5.6)

## 2023-10-25 LAB — VITAMIN D 25 HYDROXY (VIT D DEFICIENCY, FRACTURES): Vit D, 25-Hydroxy: 51.9 ng/mL (ref 30.0–100.0)

## 2023-10-25 LAB — LIPID PANEL
Chol/HDL Ratio: 2 ratio (ref 0.0–5.0)
Cholesterol, Total: 92 mg/dL — ABNORMAL LOW (ref 100–199)
HDL: 45 mg/dL (ref >39)
LDL Chol Calc (NIH): 19 mg/dL (ref 0–99)
Triglycerides: 177 mg/dL — ABNORMAL HIGH (ref 0–149)
VLDL Cholesterol Cal: 28 mg/dL (ref 5–40)

## 2023-10-25 LAB — TSH: TSH: 2.15 u[IU]/mL (ref 0.450–4.500)

## 2023-10-31 ENCOUNTER — Ambulatory Visit (INDEPENDENT_AMBULATORY_CARE_PROVIDER_SITE_OTHER): Payer: 59 | Admitting: Family Medicine

## 2023-10-31 ENCOUNTER — Encounter: Payer: Self-pay | Admitting: Family Medicine

## 2023-10-31 VITALS — BP 105/66 | HR 58 | Ht 72.0 in | Wt 192.8 lb

## 2023-10-31 DIAGNOSIS — B029 Zoster without complications: Secondary | ICD-10-CM

## 2023-10-31 DIAGNOSIS — R7303 Prediabetes: Secondary | ICD-10-CM | POA: Insufficient documentation

## 2023-10-31 DIAGNOSIS — Z0000 Encounter for general adult medical examination without abnormal findings: Secondary | ICD-10-CM | POA: Insufficient documentation

## 2023-10-31 DIAGNOSIS — N529 Male erectile dysfunction, unspecified: Secondary | ICD-10-CM

## 2023-10-31 DIAGNOSIS — Z23 Encounter for immunization: Secondary | ICD-10-CM

## 2023-10-31 DIAGNOSIS — I5020 Unspecified systolic (congestive) heart failure: Secondary | ICD-10-CM

## 2023-10-31 MED ORDER — SILDENAFIL CITRATE 25 MG PO TABS: 12.5000 mg | ORAL_TABLET | Freq: Every day | ORAL | 1 refills | Status: AC | PRN

## 2023-10-31 NOTE — Assessment & Plan Note (Signed)
 Continue GDMT w/ entresto , jardiance , metoprolol .  Follows with cardiology.

## 2023-10-31 NOTE — Assessment & Plan Note (Signed)
 Reports a two-year history of difficulty maintaining an erection. Has concerns about medication side effects given his cardiac history and baseline low-normal blood pressure. - Prescribed Sildenafil  25 mg, take 0.5 - 1 tablet as needed. - Counseled to start with half a tablet (12.5 mg) to assess for side effects, particularly hypotension (lightheadedness, dizziness). - Advised to inform me if it is ineffective or causes side effects. - Discussed that if medication is not tolerated or is ineffective, referral to urology for other options (e.g., injections) is possible.

## 2023-10-31 NOTE — Patient Instructions (Addendum)
 It was nice to see you today,  We addressed the following topics today: -I am sending in a prescription for Viagra .  It is the lowest dose available.  Take half a tablet at first.  If it is not effective try taking a full tablet next time. - If you do not tolerate it let me know and I will send in referral to the urologist to discuss other options. - I would recommend calling your gastroenterologist office to discuss repeat colonoscopy.  In 2019 you had 1 tubular adenoma and they recommended a 5-year follow-up.  Phone number is: [phone]  Have a great day,  Rolan Slain, MD

## 2023-10-31 NOTE — Progress Notes (Signed)
 Annual physical  Subjective   Patient ID: Edward Pierce, male    DOB: 09-08-1965  Age: 58 y.o. MRN: [id number]  Chief Complaint  Patient presents with   Annual Exam   HPI Edward Pierce is a 58 y.o. old male here  for annual exam.     Subjective - Post-herpetic neuralgia: Reports some residual numbness and tingling on the trunk since shingles diagnosis on 09/15/2023. Reports light touch from clothing or bedsheets causes more pain than direct pressure. States it is not unbearable. Did not find gabapentin  helpful during the acute phase.  - Erectile dysfunction: Reports worsening erectile dysfunction over the last two years, specifically with maintaining an erection. Denies a change in libido. Notes occasional morning erections, but less frequent than previously. Has not tried any medications for this previously due to concerns about effects on blood pressure and heart condition. Reports occasional lightheadedness with postural changes, which is attributed to current medications.  - Health Maintenance: Discussed vaccines and preventative screening.  Medications Reports taking atorvastatin  20 mg, Repatha  every other week, vitamin D , a multivitamin, Jardiance , loratadine 5 mg daily, metoprolol , Entresto  24/26 mg twice daily, and Brilinta . No longer takes gabapentin . Carries nitroglycerin  but has never used it. Recently had difficulty filling Entresto  at [facility] but was able to obtain it at a different pharmacy.  PMH, PSH, FH, Social Hx PMH: History of myocardial infarction with 99% LAD blockage, resulting in reduced ejection fraction (last known 45%). Shingles (herpes zoster) in July 2025. Allergic rhinitis. History of a single tubular adenoma found on colonoscopy in 2019. PSH: FH: Mother with diabetes mellitus. Social Hx: Drinks water primarily, rarely consumes sugary beverages. Tries to limit dietary sugars and carbohydrates.  ROS Constitutional: No fever, chills. CV: No chest pain,  palpitations. Denies leg swelling. Neuro: Reports residual numbness and tingling from shingles. Occasional lightheadedness with standing. GU: Reports erectile dysfunction.    The ASCVD Risk score (Arnett DK, et al., 2019) failed to calculate for the following reasons:   Risk score cannot be calculated because patient has a medical history suggesting prior/existing ASCVD  Health Maintenance Due  Topic Date Due   Hepatitis C Screening  Never done   Hepatitis B Vaccines 19-59 Average Risk (1 of 3 - 19+ 3-dose series) Never done   Zoster Vaccines- Shingrix (1 of 2) Never done   Colonoscopy  11/01/2022   COVID-19 Vaccine (3 - 2024-25 season) 11/13/2022   INFLUENZA VACCINE  10/13/2023      Objective:     BP 105/66   Pulse (!) 58   Ht 6' (1.829 m)   Wt 192 lb 12 oz (87.4 kg)   SpO2 98%   BMI 26.14 kg/m    Physical Exam Gen: alert, oriented Heent: perrla, eomi Cv: rrrr, no murmur Pulm: lctab, no wheeze Gi: soft, nbs Msk: strength equal Ext: no pedal edema Psych: pleasant affect   No results found for any visits on 10/31/23.      Assessment & Plan:   Physical exam, annual  Prediabetes Assessment & Plan: Recent A1C is 5.8, up from 5.7 one year ago. Cholesterol panel was within normal limits, with low total cholesterol on statin therapy and triglycerides that are only minimally elevated and improved. Mother has diabetes. Reports dietary modifications including limiting sugar and carbohydrates. - Continue current diet and exercise. - Recheck A1C at next routine visit.   Herpes zoster without complication Assessment & Plan: Ongoing numbness and tingling pain in the distribution of shingles rash from 09/15/2023.  Pain is exacerbated by light touch. Did not find Gabapentin  helpful previously. - Continue to monitor symptoms.   Erectile dysfunction, unspecified erectile dysfunction type Assessment & Plan: Reports a two-year history of difficulty maintaining an  erection. Has concerns about medication side effects given his cardiac history and baseline low-normal blood pressure. - Prescribed Sildenafil  25 mg, take 0.5 - 1 tablet as needed. - Counseled to start with half a tablet (12.5 mg) to assess for side effects, particularly hypotension (lightheadedness, dizziness). - Advised to inform me if it is ineffective or causes side effects. - Discussed that if medication is not tolerated or is ineffective, referral to urology for other options (e.g., injections) is possible.   Healthcare maintenance Assessment & Plan: Due for tetanus, pneumococcal vaccines. Due for screening colonoscopy. - Administered tetanus and pneumococcal vaccines today. - Counseled on the need for a follow-up colonoscopy in 5 years from the 2019 procedure due to the finding of a tubular adenoma. Provided the number for the gastroenterologist's office to self-schedule. No new referral is needed. - Discussed Shingrix vaccine to prevent future shingles outbreaks; will consider at a future visit.   HFrEF (heart failure with reduced ejection fraction) (HCC) Assessment & Plan: Continue GDMT w/ entresto , jardiance , metoprolol .  Follows with cardiology.   Need for pneumococcal 20-valent conjugate vaccination -     Pneumococcal conjugate vaccine 20-valent  Vaccine for diphtheria-tetanus-pertussis, combined -     Tdap vaccine greater than or equal to 7yo IM  Other orders -     Sildenafil  Citrate; Take 0.5-1 tablets (12.5-25 mg total) by mouth daily as needed for erectile dysfunction.  Dispense: 10 tablet; Refill: 1     Return in about 1 year (around 10/30/2024) for physical.    Toribio Edward Slain, MD

## 2023-10-31 NOTE — Assessment & Plan Note (Signed)
 Recent A1C is 5.8, up from 5.7 one year ago. Cholesterol panel was within normal limits, with low total cholesterol on statin therapy and triglycerides that are only minimally elevated and improved. Mother has diabetes. Reports dietary modifications including limiting sugar and carbohydrates. - Continue current diet and exercise. - Recheck A1C at next routine visit.

## 2023-10-31 NOTE — Assessment & Plan Note (Signed)
 Ongoing numbness and tingling pain in the distribution of shingles rash from 09/15/2023. Pain is exacerbated by light touch. Did not find Gabapentin  helpful previously. - Continue to monitor symptoms.

## 2023-10-31 NOTE — Assessment & Plan Note (Signed)
 Due for tetanus, pneumococcal vaccines. Due for screening colonoscopy. - Administered tetanus and pneumococcal vaccines today. - Counseled on the need for a follow-up colonoscopy in 5 years from the 2019 procedure due to the finding of a tubular adenoma. Provided the number for the gastroenterologist's office to self-schedule. No new referral is needed. - Discussed Shingrix vaccine to prevent future shingles outbreaks; will consider at a future visit.

## 2023-11-17 ENCOUNTER — Other Ambulatory Visit: Payer: Self-pay | Admitting: Cardiology

## 2023-11-17 NOTE — Telephone Encounter (Signed)
  He will probably need to come in to be seen by either me or an APP to get him converted to a different ARB.   Anner Alm ORN, MD  10/19/23 10:31 PM

## 2023-11-17 NOTE — Telephone Encounter (Signed)
 Called left a meesage for to call back if he needs  achange in medication form Entresto  ( not  being covered)

## 2023-11-20 MED ORDER — SACUBITRIL-VALSARTAN 24-26 MG PO TABS: 1.0000 | ORAL_TABLET | Freq: Two times a day (BID) | ORAL | 3 refills | Status: DC

## 2023-11-20 NOTE — Addendum Note (Signed)
 Addended by: CHAUVIGNE, Melisa Donofrio on: 11/20/2023 05:22 PM   Modules accepted: Orders

## 2023-11-20 NOTE — Telephone Encounter (Signed)
 Called left message on wife voicemail - asking to review patient mychart -- has opening  for 11/21/22 if needed.  Aske if they could respond to  his mychart.

## 2023-11-21 NOTE — Telephone Encounter (Signed)
 Called left message on patient's cell--  He does not need to come to appointment to day.  Discussed with Dr Anner, if Entresto  is not available due to cost.  Per Dr Anner can switch to Valsartan  40 mg twice a day and discontinue Entresto  24/26 mg twice a day

## 2023-11-21 NOTE — Telephone Encounter (Signed)
 Spoke to patient. He is aware that an appointment is not needed.  Discussed changing Entresto  24/26 mg if needed to another medication  Valsartan  40 mg twice a day due to cost.   Patient states he would like to continue with Entresto  24/26 mg at present time - He will revisit if needed- Patient states he can pay for medication ,it will deductible for the year.   Regular follow up appt schedule for 12/25/23 at 3:20 pm

## 2023-12-13 ENCOUNTER — Other Ambulatory Visit: Payer: Self-pay | Admitting: Cardiology

## 2023-12-13 DIAGNOSIS — I255 Ischemic cardiomyopathy: Secondary | ICD-10-CM

## 2023-12-13 DIAGNOSIS — I25119 Atherosclerotic heart disease of native coronary artery with unspecified angina pectoris: Secondary | ICD-10-CM

## 2023-12-13 DIAGNOSIS — Z955 Presence of coronary angioplasty implant and graft: Secondary | ICD-10-CM

## 2023-12-25 ENCOUNTER — Encounter: Payer: Self-pay | Admitting: Cardiology

## 2023-12-25 ENCOUNTER — Ambulatory Visit: Attending: Cardiology | Admitting: Cardiology

## 2023-12-25 VITALS — BP 98/63 | HR 53 | Resp 16 | Ht 72.0 in | Wt 198.6 lb

## 2023-12-25 DIAGNOSIS — E785 Hyperlipidemia, unspecified: Secondary | ICD-10-CM

## 2023-12-25 DIAGNOSIS — Z955 Presence of coronary angioplasty implant and graft: Secondary | ICD-10-CM | POA: Diagnosis not present

## 2023-12-25 DIAGNOSIS — I2510 Atherosclerotic heart disease of native coronary artery without angina pectoris: Secondary | ICD-10-CM

## 2023-12-25 DIAGNOSIS — I5020 Unspecified systolic (congestive) heart failure: Secondary | ICD-10-CM | POA: Diagnosis not present

## 2023-12-25 DIAGNOSIS — R42 Dizziness and giddiness: Secondary | ICD-10-CM

## 2023-12-25 DIAGNOSIS — I255 Ischemic cardiomyopathy: Secondary | ICD-10-CM

## 2023-12-25 MED ORDER — SACUBITRIL-VALSARTAN 24-26 MG PO TABS: ORAL_TABLET | ORAL | Status: AC

## 2023-12-25 MED ORDER — ATORVASTATIN CALCIUM 10 MG PO TABS: 10.0000 mg | ORAL_TABLET | Freq: Every day | ORAL | 3 refills | Status: AC

## 2023-12-25 NOTE — Patient Instructions (Addendum)
 Medication Instructions:    Decrease  Atorvastatin  1/2 tablet of 20 mg  ( 10 mg ) daily   Decrease morning dose 1/2 tablet of 24/26 mg  and take full dose 24/26 mg  in the evening  *If you need a refill on your cardiac medications before your next appointment, please call your pharmacy*   Lab Work: Not needed    Testing/Procedures:  Not needed  Follow-Up: At [facility], you and your health needs are our priority.  As part of our continuing mission to provide you with exceptional heart care, we have created designated Provider Care Teams.  These Care Teams include your primary Cardiologist (physician) and Advanced Practice Providers (APPs -  Physician Assistants and Nurse Practitioners) who all work together to provide you with the care you need, when you need it.     Your next appointment:   1 year(s)  The format for your next appointment:   In Person  Provider:   Alm Clay, MD

## 2023-12-25 NOTE — Progress Notes (Signed)
 Cardiology Office Note:  .   Date:  12/30/2023  ID:  Edward Pierce, DOB 01-14-1966, MRN [id number] PCP: Chandra Toribio POUR, MD  [facility] Providers Cardiologist:  Alm Clay, MD     Chief Complaint  Patient presents with   Coronary artery disease involving native coronary artery of   Follow-up    1 year    Patient Profile: .     Edward Pierce is a 58 y.o. male former smoker with a PMH noted below who presents here for annual f/u at the request of Chandra Toribio POUR, MD.  Edward Pierce is a  58 y.o. male former smoker is a three-year post-heart attack survivor, has been managing his condition with a regimen of Repatha , Toprol , Brilinta , Entresto , and Jardiance . He presents here for annual follow-up at the request of Hanford Powell BRAVO, NP.  Cardiac History: July 2021 in the setting of NSTEMI.  NSVT in the setting of anterior MI. CATH-PCI: mLAD 99-0% s/p DES.  =  After 1 year DAPT, on maintenance Brilinta  60 mg twice daily.   Echo in setting of non-STEMI:  EF 35%, mid to apical anteroseptal and inferoseptal akinesis, akinetic true apex, no evidence of LV thrombus, G1 DD, no significant valvular abnormalities.     D/c with LifeVest.   ICM/HFrEF repeat Echo in October 2021: EF improved to 40 to 45%, mid anteroseptal and apical akinesis consistent with LAD infarct. HTN,  HLD => on combination Repatha  140 mg weekly plus atorvastatin  20 mg       Edward Pierce was last seen on December 14, 2022 he had just returned from a trip to [place] -> we did quite a bit of walking without any chest tightness or pressure.  No heart failure symptoms.  No angina symptoms.  He had hernia surgery and internal cyst in his jaw removed.  Both procedures well.  Negative cardiac symptoms -> no angina or heart failure.  Subjective  Discussed the use of AI scribe software for clinical note transcription with the patient, who gave verbal consent to proceed.  History of Present Illness Edward Pierce is a 58  year old male with coronary artery disease who presents for a routine follow-up visit.  He has been doing well overall and continues to work, noting a significant decrease in stress levels since receiving additional help at his job. Previously, he managed multiple roles, which contributed to higher stress.  In July 2025, he experienced shingles and received a vaccine.  He recently traveled to [place] and [place], engaging in significant physical activity such as walking and climbing. During these activities, he experienced no chest pain, pressure, shortness of breath, heart racing, or dizziness, although he occasionally feels lightheaded.  He has lost approximately 9-10 pounds over the past year. His blood pressure tends to run low, and he experiences lightheadedness without a specific trigger.  He is currently on Jardiance , low-dose Toprol , Entresto , and Brilinta . He has not experienced any heart racing or skipping, and his stools have been dark but consistent with his usual pattern. No blood in stools, urine, or epistaxis.  No swelling, shortness of breath, or waking up short of breath. No heart racing or skipping, except for one or two occasions over the past year, which were not concerning to him.  Cardiovascular ROS: no chest pain or dyspnea on exertion positive for - occasional dizziness. negative for - edema, irregular heartbeat, orthopnea, palpitations, paroxysmal nocturnal dyspnea, rapid heart rate, shortness of breath, or  syncope/near syncope; TIA/amaurosis fugax; claudication.  ROS:  Review of Systems - Negative except intentional wgt loss.     Objective   Current Meds  Medication Sig   acetaminophen  (TYLENOL ) 500 MG tablet Take 500 mg by mouth every 6 (six) hours as needed. Takes as needed.   atorvastatin  (LIPITOR ) 20 MG tablet Take 1 tablet (20 mg total) by mouth at bedtime.   empagliflozin  (JARDIANCE ) 10 MG TABS tablet Take 1 tablet (10 mg total) by mouth daily before  breakfast.   Evolocumab  (REPATHA  SURECLICK) 140 MG/ML SOAJ INJECT 1 PEN INTO THE SKIN EVERY 14 (FOURTEEN) DAYS.   Loperamide-Simethicone 2-125 MG TABS Take 0.5 tablets by mouth 2 (two) times daily.   loratadine (CLARITIN) 10 MG tablet Take 10 mg by mouth daily as needed for allergies.   metoprolol  succinate (TOPROL -XL) 25 MG 24 hr tablet TAKE 1/2 TABLET BY MOUTH EVERY DAY   Multiple Vitamin (MULTIVITAMIN) tablet Take 1 tablet by mouth daily.   nitroGLYCERIN  (NITROSTAT ) 0.4 MG SL tablet Place 1 tablet under the tongue every 5 minutes as needed for chest pain, up to 3 doses in 15 minutes   sacubitril -valsartan  (ENTRESTO ) 24-26 MG Take 1 tablet by mouth 2 (two) times daily.   sildenafil  (VIAGRA ) 25 MG tablet Take 0.5-1 tablets (12.5-25 mg total) by mouth daily as needed for erectile dysfunction.   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  Studies Reviewed: SABRA   EKG Interpretation Date/Time:  Monday December 25 2023 15:25:33 EDT Ventricular Rate:  53 PR Interval:  150 QRS Duration:  74 QT Interval:  410 QTC Calculation: 384 R Axis:   33  Text Interpretation: Sinus bradycardia Septal infarct , age undetermined When compared with ECG of 14-Dec-2022 09:05, No significant change was found Confirmed by Anner Lenis ([id number]) on 12/25/2023 4:09:04 PM    Lab Results  Component Value Date   CHOL 92 (L) 10/24/2023   HDL 45 10/24/2023   LDLCALC 19 10/24/2023   TRIG 177 (H) 10/24/2023   CHOLHDL 2.0 10/24/2023   Lab Results  Component Value Date   HGBA1C 5.8 (H) 10/24/2023   Lab Results  Component Value Date   NA 138 10/24/2023   K 4.0 10/24/2023   CREATININE 0.87 10/24/2023   EGFR 100 10/24/2023   GLUCOSE 92 10/24/2023   Lab Results  Component Value Date   WBC 4.4 10/24/2023   HGB 15.9 10/24/2023   HCT 47.8 10/24/2023   MCV 93 10/24/2023   PLT 186 10/24/2023   Cardiac Cath-PCI 10/02/2019: CULPRIT - 99% mLAD =>  Asp thrombectomy &DES PCI Synergy 3.0 x 23. 3.3 mm  DES PCI:  Diagnostic                                                                                    Intervention       TTE 12/2019: EF 40 - 45%. Mildly reduced LV fxn, apical & anterolateral akinesis. Normal RV function. Normal RAP. Mild aortic dilation -39 mm   Risk Assessment/Calculations:        Physical Exam:   VS:  BP  98/63 (BP Location: Left Arm, Patient Position: Sitting, Cuff Size: Large)   Pulse (!) 53   Resp 16   Ht 6' (1.829 m)   Wt 198 lb 9.6 oz (90.1 kg)   SpO2 95%   BMI 26.94 kg/m    Wt Readings from Last 3 Encounters:  12/25/23 198 lb 9.6 oz (90.1 kg)  10/31/23 192 lb 12 oz (87.4 kg)  12/14/22 202 lb 9.6 oz (91.9 kg)      GEN: Well nourished, well groomed; in no acute distress; healthy appearing. NECK: No JVD; No carotid bruits CARDIAC: Normal S1, S2; RRR, no murmurs, rubs, gallops RESPIRATORY:  Clear to auscultation without rales, wheezing or rhonchi ; nonlabored, good air movement. ABDOMEN: Soft, non-tender, non-distended EXTREMITIES:  No edema; No deformity     ASSESSMENT AND PLAN: .    Problem List Items Addressed This Visit       Cardiology Problems   Coronary artery disease involving native coronary artery without angina pectoris - Primary (Chronic)   Atherosclerotic heart disease without angina.  - Continue GDMT with low-dose Toprol  12.5 mg daily  - Continue Entresto  24-26 mg -> but decrease morning dose to one half tab and continue taking full dose in the p.m. - Continue Repatha  and atorvastatin , but reduce atorvastatin  to 10 mg daily - Continue on maintenance dose Brilinta  60 mg twice daily SAPT for stent patency. No bleeding or anemia. - Continue Brilinta  as prescribed.      Relevant Medications   atorvastatin  (LIPITOR ) 10 MG tablet   sacubitril -valsartan  (ENTRESTO ) 24-26 MG   Other Relevant Orders   EKG 12-Lead (Completed)   HFrEF (heart failure with reduced ejection fraction) (HCC)  (Chronic)   Chronic heart failure with ejection fraction 40-45%. Euvolemic on exam.  NYHA class I CHF symptoms.  He is now noting lightheadedness due to hypotension and weight loss.  Blood pressure 98 mmHg. - Reduce morning dose of Entresto  to 1/2 tablet. ->  One half tab a.m., full 10 PM, continue low-dose Toprol . -Continue Jardiance .      Relevant Medications   atorvastatin  (LIPITOR ) 10 MG tablet   sacubitril -valsartan  (ENTRESTO ) 24-26 MG   Hyperlipidemia with target low density lipoprotein (LDL) cholesterol less than 55 mg/dL (Chronic)   Hyperlipidemia well-controlled on Repatha  and atorvastatin .  Excellent cholesterol levels with LDL of 19.  Considering atorvastatin  dose reduction to assess A1c impact. - Reduce atorvastatin  dose to half a tablet.-10 mg daily      Relevant Medications   atorvastatin  (LIPITOR ) 10 MG tablet   sacubitril -valsartan  (ENTRESTO ) 24-26 MG   Ischemic cardiomyopathy (Chronic)   Partially recovered EF on Echo post MI revascularization.   Not unexpectedly he did not have a full recovery because of prolonged presentation time. -On low-dose Toprol  12.5 mg daily and reducing morning dose of Entresto  (24 to 26 mg) to 1/2 tablet in the morning and full tablet in the evening. -BP is soft, therefore not tolerate MRA or further titration of existing meds. - Tolerating Jardiance  10 mg daily. - Euvolemic on exam.  No requirement for diuretic.      Relevant Medications   atorvastatin  (LIPITOR ) 10 MG tablet   sacubitril -valsartan  (ENTRESTO ) 24-26 MG     Other   Dizziness on standing   Dizziness likely related to low blood pressure and medication Intermittent dizziness due to hypotension and medication adjustments. Low blood pressure possibly exacerbated by weight loss. - Reduce morning dose of Entresto  to half a tablet. - Continue to encourage adequate hydration 60-80 oz  daily.      Presence of drug coated stent in LAD coronary artery (Chronic)   S/p PCI with  DES to the mid LAD. - Converted from ASA/Brilinta  DAPT to maintenance dose Brilinta  60 mg twice daily SAPT  --  Okay to hold Brilinta  5 to 7 days preop for surgeries or procedures.            Follow-Up: Return in about 1 year (around 12/24/2024) for 1 Yr Follow-up, Routine follow up with me.     Signed, Alm MICAEL Clay, MD, MS Alm Clay, M.D., M.S. Interventional Cardiologist  [facility] Pager # [phone]

## 2023-12-30 ENCOUNTER — Encounter: Payer: Self-pay | Admitting: Cardiology

## 2023-12-30 DIAGNOSIS — R42 Dizziness and giddiness: Secondary | ICD-10-CM | POA: Insufficient documentation

## 2023-12-30 NOTE — Assessment & Plan Note (Signed)
 Dizziness likely related to low blood pressure and medication Intermittent dizziness due to hypotension and medication adjustments. Low blood pressure possibly exacerbated by weight loss. - Reduce morning dose of Entresto  to half a tablet. - Continue to encourage adequate hydration 60-80 oz daily.

## 2023-12-30 NOTE — Assessment & Plan Note (Signed)
 S/p PCI with DES to the mid LAD. - Converted from ASA/Brilinta  DAPT to maintenance dose Brilinta  60 mg twice daily SAPT  --  Okay to hold Brilinta  5 to 7 days preop for surgeries or procedures.

## 2023-12-30 NOTE — Assessment & Plan Note (Signed)
 Atherosclerotic heart disease without angina.  - Continue GDMT with low-dose Toprol  12.5 mg daily  - Continue Entresto  24-26 mg -> but decrease morning dose to one half tab and continue taking full dose in the p.m. - Continue Repatha  and atorvastatin , but reduce atorvastatin  to 10 mg daily - Continue on maintenance dose Brilinta  60 mg twice daily SAPT for stent patency. No bleeding or anemia. - Continue Brilinta  as prescribed.

## 2023-12-30 NOTE — Assessment & Plan Note (Signed)
 Chronic heart failure with ejection fraction 40-45%. Euvolemic on exam.  NYHA class I CHF symptoms.  He is now noting lightheadedness due to hypotension and weight loss.  Blood pressure 98 mmHg. - Reduce morning dose of Entresto  to 1/2 tablet. ->  One half tab a.m., full 10 PM, continue low-dose Toprol . -Continue Jardiance .

## 2023-12-30 NOTE — Assessment & Plan Note (Addendum)
 Hyperlipidemia well-controlled on Repatha  and atorvastatin .  Excellent cholesterol levels with LDL of 19.  Considering atorvastatin  dose reduction to assess A1c impact. - Reduce atorvastatin  dose to half a tablet.-10 mg daily

## 2023-12-30 NOTE — Assessment & Plan Note (Signed)
 Partially recovered EF on Echo post MI revascularization.   Not unexpectedly he did not have a full recovery because of prolonged presentation time. -On low-dose Toprol  12.5 mg daily and reducing morning dose of Entresto  (24 to 26 mg) to 1/2 tablet in the morning and full tablet in the evening. -BP is soft, therefore not tolerate MRA or further titration of existing meds. - Tolerating Jardiance  10 mg daily. - Euvolemic on exam.  No requirement for diuretic.

## 2024-01-15 ENCOUNTER — Other Ambulatory Visit: Payer: Self-pay | Admitting: Cardiology

## 2024-01-15 DIAGNOSIS — E785 Hyperlipidemia, unspecified: Secondary | ICD-10-CM

## 2024-02-14 ENCOUNTER — Other Ambulatory Visit: Payer: Self-pay | Admitting: Cardiology

## 2024-03-14 ENCOUNTER — Other Ambulatory Visit: Payer: Self-pay | Admitting: Cardiology

## 2024-10-25 ENCOUNTER — Other Ambulatory Visit

## 2024-11-01 ENCOUNTER — Encounter: Admitting: Family Medicine
